# Patient Record
Sex: Male | Born: 1960 | Race: White | Hispanic: No | Marital: Single | Smoking: Current every day smoker
Health system: Southern US, Community
[De-identification: ages and names within clinical notes are randomized; demographics above are authoritative.]

## PROBLEM LIST (undated history)

## (undated) DIAGNOSIS — M4620 Osteomyelitis of vertebra, site unspecified: Secondary | ICD-10-CM

## (undated) DIAGNOSIS — I10 Essential (primary) hypertension: Secondary | ICD-10-CM

## (undated) DIAGNOSIS — F1190 Opioid use, unspecified, uncomplicated: Secondary | ICD-10-CM

## (undated) DIAGNOSIS — N289 Disorder of kidney and ureter, unspecified: Secondary | ICD-10-CM

## (undated) DIAGNOSIS — M545 Low back pain, unspecified: Secondary | ICD-10-CM

## (undated) DIAGNOSIS — F419 Anxiety disorder, unspecified: Secondary | ICD-10-CM

## (undated) DIAGNOSIS — M1990 Unspecified osteoarthritis, unspecified site: Secondary | ICD-10-CM

## (undated) DIAGNOSIS — G629 Polyneuropathy, unspecified: Secondary | ICD-10-CM

## (undated) DIAGNOSIS — G8929 Other chronic pain: Secondary | ICD-10-CM

## (undated) DIAGNOSIS — J189 Pneumonia, unspecified organism: Secondary | ICD-10-CM

## (undated) HISTORY — PX: LEG SURGERY: SHX1003

## (undated) HISTORY — PX: BACK SURGERY: SHX140

## (undated) HISTORY — PX: ABDOMINAL SURGERY: SHX537

## (undated) HISTORY — PX: HERNIA REPAIR: SHX51

---

## 2001-06-23 ENCOUNTER — Emergency Department (HOSPITAL_COMMUNITY): Admission: EM | Admit: 2001-06-23 | Discharge: 2001-06-24 | Payer: Self-pay | Admitting: *Deleted

## 2001-06-23 ENCOUNTER — Encounter: Payer: Self-pay | Admitting: *Deleted

## 2001-09-18 ENCOUNTER — Encounter (HOSPITAL_COMMUNITY): Admission: RE | Admit: 2001-09-18 | Discharge: 2001-10-18 | Payer: Self-pay

## 2001-10-10 ENCOUNTER — Emergency Department (HOSPITAL_COMMUNITY): Admission: EM | Admit: 2001-10-10 | Discharge: 2001-10-10 | Payer: Self-pay | Admitting: *Deleted

## 2001-10-28 ENCOUNTER — Encounter (HOSPITAL_COMMUNITY): Admission: RE | Admit: 2001-10-28 | Discharge: 2001-11-27 | Payer: Self-pay

## 2001-12-05 ENCOUNTER — Encounter (HOSPITAL_COMMUNITY): Admission: RE | Admit: 2001-12-05 | Discharge: 2002-01-04 | Payer: Self-pay

## 2002-01-14 ENCOUNTER — Encounter (HOSPITAL_COMMUNITY): Admission: RE | Admit: 2002-01-14 | Discharge: 2002-02-13 | Payer: Self-pay

## 2002-09-12 ENCOUNTER — Inpatient Hospital Stay (HOSPITAL_COMMUNITY): Admission: EM | Admit: 2002-09-12 | Discharge: 2002-09-16 | Payer: Self-pay | Admitting: Psychiatry

## 2004-06-07 ENCOUNTER — Emergency Department (HOSPITAL_COMMUNITY): Admission: EM | Admit: 2004-06-07 | Discharge: 2004-06-07 | Payer: Self-pay | Admitting: Emergency Medicine

## 2011-11-14 HISTORY — PX: POSTERIOR LUMBAR FUSION: SHX6036

## 2012-05-17 ENCOUNTER — Other Ambulatory Visit: Payer: Self-pay | Admitting: Family Medicine

## 2012-05-17 ENCOUNTER — Ambulatory Visit (HOSPITAL_COMMUNITY)
Admission: RE | Admit: 2012-05-17 | Discharge: 2012-05-17 | Disposition: A | Discharge status: Home / Self Care | Payer: Medicaid Other | Source: Ambulatory Visit | Attending: Internal Medicine | Admitting: Internal Medicine

## 2012-05-17 ENCOUNTER — Other Ambulatory Visit: Payer: Self-pay | Admitting: Internal Medicine

## 2012-05-17 DIAGNOSIS — R0602 Shortness of breath: Secondary | ICD-10-CM

## 2012-05-17 DIAGNOSIS — F17200 Nicotine dependence, unspecified, uncomplicated: Secondary | ICD-10-CM | POA: Insufficient documentation

## 2013-02-01 ENCOUNTER — Emergency Department (HOSPITAL_COMMUNITY)
Admission: EM | Admit: 2013-02-01 | Discharge: 2013-02-02 | Disposition: A | Discharge status: Home / Self Care | Payer: Medicaid Other | Attending: Emergency Medicine | Admitting: Emergency Medicine

## 2013-02-01 ENCOUNTER — Encounter (HOSPITAL_COMMUNITY): Payer: Self-pay

## 2013-02-01 DIAGNOSIS — F1010 Alcohol abuse, uncomplicated: Secondary | ICD-10-CM | POA: Insufficient documentation

## 2013-02-01 DIAGNOSIS — F17200 Nicotine dependence, unspecified, uncomplicated: Secondary | ICD-10-CM | POA: Insufficient documentation

## 2013-02-01 DIAGNOSIS — Z043 Encounter for examination and observation following other accident: Secondary | ICD-10-CM | POA: Insufficient documentation

## 2013-02-01 DIAGNOSIS — F10929 Alcohol use, unspecified with intoxication, unspecified: Secondary | ICD-10-CM

## 2013-02-01 NOTE — ED Notes (Addendum)
EMS- pt from home.  Larey Seat tonight walking with walker to br.  Pt states he had been drinking and shouldn't have even been walking.  Pt denies any c/o post fall. Wife had wanted him to be evaluated for his fall.  [id number]
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
## 2013-02-02 NOTE — ED Notes (Signed)
Pt remains resting.   Pt given urinal.  md in to speak to pt.  Pt still not able to give md hx of problem.

## 2013-02-02 NOTE — ED Notes (Signed)
Pt remains resting at this time.  md in to speak to pt.  Pt still unable to talk to him.

## 2014-08-03 ENCOUNTER — Emergency Department (HOSPITAL_COMMUNITY): Payer: Medicaid Other

## 2014-08-03 ENCOUNTER — Emergency Department (HOSPITAL_COMMUNITY)
Admission: EM | Admit: 2014-08-03 | Discharge: 2014-08-03 | Disposition: A | Discharge status: Home / Self Care | Payer: Medicaid Other | Attending: Emergency Medicine | Admitting: Emergency Medicine

## 2014-08-03 ENCOUNTER — Encounter (HOSPITAL_COMMUNITY): Payer: Self-pay | Admitting: Emergency Medicine

## 2014-08-03 DIAGNOSIS — F17200 Nicotine dependence, unspecified, uncomplicated: Secondary | ICD-10-CM | POA: Diagnosis not present

## 2014-08-03 DIAGNOSIS — R059 Cough, unspecified: Secondary | ICD-10-CM | POA: Insufficient documentation

## 2014-08-03 DIAGNOSIS — Z79899 Other long term (current) drug therapy: Secondary | ICD-10-CM | POA: Diagnosis not present

## 2014-08-03 DIAGNOSIS — J069 Acute upper respiratory infection, unspecified: Secondary | ICD-10-CM | POA: Insufficient documentation

## 2014-08-03 DIAGNOSIS — R05 Cough: Secondary | ICD-10-CM | POA: Diagnosis present

## 2014-08-03 MED ORDER — BENZONATATE 100 MG PO CAPS: 100.0000 mg | ORAL_CAPSULE | Freq: Three times a day (TID) | ORAL | Status: DC
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
## 2014-08-03 NOTE — ED Notes (Signed)
Per EMS pt reports cold and non productive cough x 5 days. Not taking anything over the counter. BP 164/100, HR 90, O2 99%.

## 2014-08-03 NOTE — Discharge Instructions (Signed)
Cough, Adult   A cough is a reflex. It helps you clear your throat and airways. A cough can help heal your body. A cough can last 2 or 3 weeks (acute) or may last more than 8 weeks (chronic). Some common causes of a cough can include an infection, allergy, or a cold.  HOME CARE  · Only take medicine as told by your doctor.  · If given, take your medicines (antibiotics) as told. Finish them even if you start to feel better.  · Use a cold steam vaporizer or humidifier in your home. This can help loosen thick spit (secretions).  · Sleep so you are almost sitting up (semi-upright). Use pillows to do this. This helps reduce coughing.  · Rest as needed.  · Stop smoking if you smoke.  GET HELP RIGHT AWAY IF:  · You have yellowish-white fluid (pus) in your thick spit.  · Your cough gets worse.  · Your medicine does not reduce coughing, and you are losing sleep.  · You cough up blood.  · You have trouble breathing.  · Your pain gets worse and medicine does not help.  · You have a fever.  MAKE SURE YOU:   · Understand these instructions.  · Will watch your condition.  · Will get help right away if you are not doing well or get worse.  Document Released: 07/13/2011 Document Revised: 03/16/2014 Document Reviewed: 07/13/2011  Exit[employer]® Patient Information ©2015 [employer]. This information is not intended to replace advice given to you by your health care provider. Make sure you discuss any questions you have with your health care provider.

## 2014-08-24 ENCOUNTER — Inpatient Hospital Stay (HOSPITAL_COMMUNITY): Payer: Medicaid Other

## 2014-08-24 ENCOUNTER — Emergency Department (HOSPITAL_COMMUNITY): Payer: Medicaid Other

## 2014-08-24 ENCOUNTER — Encounter (HOSPITAL_COMMUNITY): Payer: Self-pay | Admitting: Emergency Medicine

## 2014-08-24 ENCOUNTER — Inpatient Hospital Stay (HOSPITAL_COMMUNITY)
Admission: EM | Admit: 2014-08-24 | Discharge: 2014-08-29 | DRG: 871 | Disposition: A | Discharge status: Home / Self Care | Payer: Medicaid Other | Attending: Internal Medicine | Admitting: Internal Medicine

## 2014-08-24 DIAGNOSIS — Z79899 Other long term (current) drug therapy: Secondary | ICD-10-CM

## 2014-08-24 DIAGNOSIS — G9389 Other specified disorders of brain: Secondary | ICD-10-CM

## 2014-08-24 DIAGNOSIS — I509 Heart failure, unspecified: Secondary | ICD-10-CM | POA: Diagnosis present

## 2014-08-24 DIAGNOSIS — F17210 Nicotine dependence, cigarettes, uncomplicated: Secondary | ICD-10-CM | POA: Diagnosis present

## 2014-08-24 DIAGNOSIS — Z590 Homelessness: Secondary | ICD-10-CM

## 2014-08-24 DIAGNOSIS — J440 Chronic obstructive pulmonary disease with acute lower respiratory infection: Secondary | ICD-10-CM | POA: Diagnosis present

## 2014-08-24 DIAGNOSIS — E871 Hypo-osmolality and hyponatremia: Secondary | ICD-10-CM | POA: Diagnosis present

## 2014-08-24 DIAGNOSIS — R600 Localized edema: Secondary | ICD-10-CM

## 2014-08-24 DIAGNOSIS — R229 Localized swelling, mass and lump, unspecified: Secondary | ICD-10-CM

## 2014-08-24 DIAGNOSIS — R6520 Severe sepsis without septic shock: Secondary | ICD-10-CM | POA: Diagnosis present

## 2014-08-24 DIAGNOSIS — S01111A Laceration without foreign body of right eyelid and periocular area, initial encounter: Secondary | ICD-10-CM | POA: Diagnosis present

## 2014-08-24 DIAGNOSIS — G9340 Encephalopathy, unspecified: Secondary | ICD-10-CM | POA: Diagnosis present

## 2014-08-24 DIAGNOSIS — D649 Anemia, unspecified: Secondary | ICD-10-CM | POA: Diagnosis present

## 2014-08-24 DIAGNOSIS — W19XXXA Unspecified fall, initial encounter: Secondary | ICD-10-CM | POA: Diagnosis present

## 2014-08-24 DIAGNOSIS — J189 Pneumonia, unspecified organism: Secondary | ICD-10-CM

## 2014-08-24 DIAGNOSIS — IMO0002 Reserved for concepts with insufficient information to code with codable children: Secondary | ICD-10-CM

## 2014-08-24 DIAGNOSIS — A419 Sepsis, unspecified organism: Principal | ICD-10-CM

## 2014-08-24 DIAGNOSIS — J9601 Acute respiratory failure with hypoxia: Secondary | ICD-10-CM | POA: Diagnosis present

## 2014-08-24 DIAGNOSIS — R05 Cough: Secondary | ICD-10-CM | POA: Diagnosis present

## 2014-08-24 DIAGNOSIS — I059 Rheumatic mitral valve disease, unspecified: Secondary | ICD-10-CM | POA: Diagnosis not present

## 2014-08-24 DIAGNOSIS — M549 Dorsalgia, unspecified: Secondary | ICD-10-CM | POA: Diagnosis present

## 2014-08-24 HISTORY — DX: Essential (primary) hypertension: I10

## 2014-08-24 HISTORY — DX: Low back pain, unspecified: M54.50

## 2014-08-24 HISTORY — DX: Pneumonia, unspecified organism: J18.9

## 2014-08-24 HISTORY — DX: Unspecified osteoarthritis, unspecified site: M19.90

## 2014-08-24 HISTORY — DX: Other chronic pain: G89.29

## 2014-08-24 HISTORY — DX: Anxiety disorder, unspecified: F41.9

## 2014-08-24 HISTORY — DX: Low back pain: M54.5

## 2014-08-24 LAB — CBC WITH DIFFERENTIAL/PLATELET
Basophils Absolute: 0 10*3/uL (ref 0.0–0.1)
Basophils Relative: 0 % (ref 0–1)
Eosinophils Absolute: 0 10*3/uL (ref 0.0–0.7)
Eosinophils Relative: 0 % (ref 0–5)
HCT: 30.7 % — ABNORMAL LOW (ref 39.0–52.0)
HEMOGLOBIN: 10.3 g/dL — AB (ref 13.0–17.0)
LYMPHS ABS: 0.5 10*3/uL — AB (ref 0.7–4.0)
Lymphocytes Relative: 3 % — ABNORMAL LOW (ref 12–46)
MCH: 27.2 pg (ref 26.0–34.0)
MCHC: 33.6 g/dL (ref 30.0–36.0)
MCV: 81.2 fL (ref 78.0–100.0)
MONOS PCT: 7 % (ref 3–12)
Monocytes Absolute: 1.1 10*3/uL — ABNORMAL HIGH (ref 0.1–1.0)
NEUTROS ABS: 14.2 10*3/uL — AB (ref 1.7–7.7)
NEUTROS PCT: 90 % — AB (ref 43–77)
Platelets: 144 10*3/uL — ABNORMAL LOW (ref 150–400)
RBC: 3.78 MIL/uL — AB (ref 4.22–5.81)
RDW: 14.5 % (ref 11.5–15.5)
WBC: 15.8 10*3/uL — ABNORMAL HIGH (ref 4.0–10.5)

## 2014-08-24 LAB — URINALYSIS, ROUTINE W REFLEX MICROSCOPIC
BILIRUBIN URINE: NEGATIVE
GLUCOSE, UA: NEGATIVE mg/dL
Hgb urine dipstick: NEGATIVE
Ketones, ur: NEGATIVE mg/dL
Leukocytes, UA: NEGATIVE
Nitrite: NEGATIVE
Protein, ur: NEGATIVE mg/dL
SPECIFIC GRAVITY, URINE: 1.017 (ref 1.005–1.030)
UROBILINOGEN UA: 1 mg/dL (ref 0.0–1.0)
pH: 5.5 (ref 5.0–8.0)

## 2014-08-24 LAB — COMPREHENSIVE METABOLIC PANEL
ALT: 40 U/L (ref 0–53)
ANION GAP: 12 (ref 5–15)
AST: 39 U/L — ABNORMAL HIGH (ref 0–37)
Albumin: 2.3 g/dL — ABNORMAL LOW (ref 3.5–5.2)
Alkaline Phosphatase: 200 U/L — ABNORMAL HIGH (ref 39–117)
BILIRUBIN TOTAL: 2 mg/dL — AB (ref 0.3–1.2)
BUN: 11 mg/dL (ref 6–23)
CO2: 26 mEq/L (ref 19–32)
Calcium: 8.2 mg/dL — ABNORMAL LOW (ref 8.4–10.5)
Chloride: 93 mEq/L — ABNORMAL LOW (ref 96–112)
Creatinine, Ser: 0.66 mg/dL (ref 0.50–1.35)
GFR calc Af Amer: >90 mL/min (ref >90)
GFR calc non Af Amer: >90 mL/min (ref >90)
GLUCOSE: 143 mg/dL — AB (ref 70–99)
POTASSIUM: 3.6 meq/L — AB (ref 3.7–5.3)
SODIUM: 131 meq/L — AB (ref 137–147)
Total Protein: 6.7 g/dL (ref 6.0–8.3)

## 2014-08-24 LAB — I-STAT VENOUS BLOOD GAS, ED
Acid-Base Excess: 1 mmol/L (ref 0.0–2.0)
Bicarbonate: 25.1 mEq/L — ABNORMAL HIGH (ref 20.0–24.0)
O2 SAT: 88 %
PCO2 VEN: 38.7 mmHg — AB (ref 45.0–50.0)
PH VEN: 7.42 — AB (ref 7.250–7.300)
PO2 VEN: 54 mmHg — AB (ref 30.0–45.0)
TCO2: 26 mmol/L (ref 0–100)

## 2014-08-24 LAB — I-STAT CG4 LACTIC ACID, ED
LACTIC ACID, VENOUS: 2.23 mmol/L — AB (ref 0.5–2.2)
Lactic Acid, Venous: 2.18 mmol/L (ref 0.5–2.2)

## 2014-08-24 LAB — TROPONIN I
Troponin I: <0.3 ng/mL (ref <0.30)
Troponin I: <0.3 ng/mL (ref <0.30)

## 2014-08-24 LAB — RAPID URINE DRUG SCREEN, HOSP PERFORMED
AMPHETAMINES: NOT DETECTED
BENZODIAZEPINES: POSITIVE — AB
Barbiturates: NOT DETECTED
Cocaine: NOT DETECTED
Opiates: POSITIVE — AB
TETRAHYDROCANNABINOL: POSITIVE — AB

## 2014-08-24 LAB — PRO B NATRIURETIC PEPTIDE: Pro B Natriuretic peptide (BNP): 1367 pg/mL — ABNORMAL HIGH (ref 0–125)

## 2014-08-24 LAB — RETICULOCYTES
RBC.: 3.96 MIL/uL — ABNORMAL LOW (ref 4.22–5.81)
Retic Count, Absolute: 63.4 10*3/uL (ref 19.0–186.0)
Retic Ct Pct: 1.6 % (ref 0.4–3.1)

## 2014-08-24 LAB — LIPASE, BLOOD: Lipase: 12 U/L (ref 11–59)

## 2014-08-24 LAB — ETHANOL: Alcohol, Ethyl (B): <11 mg/dL (ref 0–11)

## 2014-08-24 LAB — I-STAT TROPONIN, ED: Troponin i, poc: 0.15 ng/mL (ref 0.00–0.08)

## 2014-08-24 LAB — PROTIME-INR
INR: 1.27 (ref 0.00–1.49)
PROTHROMBIN TIME: 15.9 s — AB (ref 11.6–15.2)

## 2014-08-24 LAB — STREP PNEUMONIAE URINARY ANTIGEN: STREP PNEUMO URINARY ANTIGEN: NEGATIVE

## 2014-08-24 MED ORDER — SODIUM CHLORIDE 0.9 % IV BOLUS (SEPSIS)
2000.0000 mL | Freq: Once | INTRAVENOUS | Status: AC
  Administered 2014-08-24: 2000 mL via INTRAVENOUS

## 2014-08-24 MED ORDER — INFLUENZA VAC SPLIT QUAD 0.5 ML IM SUSY
0.5000 mL | PREFILLED_SYRINGE | INTRAMUSCULAR | Status: AC
  Administered 2014-08-26: 0.5 mL via INTRAMUSCULAR
  Filled 2014-08-24 (×2): qty 0.5

## 2014-08-24 MED ORDER — POTASSIUM CHLORIDE CRYS ER 20 MEQ PO TBCR
40.0000 meq | EXTENDED_RELEASE_TABLET | Freq: Once | ORAL | Status: AC
  Administered 2014-08-24: 40 meq via ORAL
  Filled 2014-08-24: qty 2

## 2014-08-24 MED ORDER — ALBUTEROL (5 MG/ML) CONTINUOUS INHALATION SOLN
10.0000 mg/h | INHALATION_SOLUTION | RESPIRATORY_TRACT | Status: DC
  Administered 2014-08-24: 10 mg/h via RESPIRATORY_TRACT
  Filled 2014-08-24: qty 20

## 2014-08-24 MED ORDER — DEXTROSE 5 % IV SOLN
500.0000 mg | INTRAVENOUS | Status: DC
  Administered 2014-08-24 – 2014-08-26 (×3): 500 mg via INTRAVENOUS
  Filled 2014-08-24 (×5): qty 500

## 2014-08-24 MED ORDER — METHYLPREDNISOLONE SODIUM SUCC 125 MG IJ SOLR
125.0000 mg | Freq: Once | INTRAMUSCULAR | Status: AC
  Administered 2014-08-24: 125 mg via INTRAVENOUS
  Filled 2014-08-24: qty 2

## 2014-08-24 MED ORDER — VANCOMYCIN HCL IN DEXTROSE 1-5 GM/200ML-% IV SOLN
1000.0000 mg | Freq: Three times a day (TID) | INTRAVENOUS | Status: DC
  Administered 2014-08-24 – 2014-08-27 (×10): 1000 mg via INTRAVENOUS
  Filled 2014-08-24 (×13): qty 200

## 2014-08-24 MED ORDER — ALBUTEROL SULFATE (2.5 MG/3ML) 0.083% IN NEBU: 2.5000 mg | INHALATION_SOLUTION | RESPIRATORY_TRACT | Status: DC | PRN

## 2014-08-24 MED ORDER — ACETAMINOPHEN 500 MG PO TABS
500.0000 mg | ORAL_TABLET | Freq: Four times a day (QID) | ORAL | Status: DC | PRN
Start: 2014-08-24 — End: 2014-08-29

## 2014-08-24 MED ORDER — PIPERACILLIN-TAZOBACTAM 3.375 G IVPB 30 MIN
3.3750 g | Freq: Once | INTRAVENOUS | Status: AC
  Administered 2014-08-24: 3.375 g via INTRAVENOUS
  Filled 2014-08-24: qty 50

## 2014-08-24 MED ORDER — PIPERACILLIN-TAZOBACTAM 3.375 G IVPB
3.3750 g | Freq: Three times a day (TID) | INTRAVENOUS | Status: DC
  Administered 2014-08-24 – 2014-08-29 (×15): 3.375 g via INTRAVENOUS
  Filled 2014-08-24 (×18): qty 50

## 2014-08-24 MED ORDER — LEVOFLOXACIN IN D5W 750 MG/150ML IV SOLN
750.0000 mg | Freq: Once | INTRAVENOUS | Status: AC
  Administered 2014-08-24: 750 mg via INTRAVENOUS
  Filled 2014-08-24: qty 150

## 2014-08-24 MED ORDER — OXYCODONE HCL 5 MG PO TABS
30.0000 mg | ORAL_TABLET | ORAL | Status: DC | PRN
  Administered 2014-08-25 – 2014-08-29 (×18): 30 mg via ORAL
  Filled 2014-08-24 (×19): qty 6

## 2014-08-24 MED ORDER — ACETAMINOPHEN 500 MG PO TABS
1000.0000 mg | ORAL_TABLET | Freq: Once | ORAL | Status: AC
  Administered 2014-08-24: 1000 mg via ORAL
  Filled 2014-08-24: qty 2

## 2014-08-24 MED ORDER — POTASSIUM CHLORIDE 10 MEQ/100ML IV SOLN
10.0000 meq | INTRAVENOUS | Status: AC
  Filled 2014-08-24 (×3): qty 100

## 2014-08-24 MED ORDER — HEPARIN SODIUM (PORCINE) 5000 UNIT/ML IJ SOLN
5000.0000 [IU] | Freq: Three times a day (TID) | INTRAMUSCULAR | Status: DC
  Administered 2014-08-24 – 2014-08-26 (×5): 5000 [IU] via SUBCUTANEOUS
  Filled 2014-08-24 (×17): qty 1

## 2014-08-24 MED ORDER — VANCOMYCIN HCL 10 G IV SOLR
1500.0000 mg | Freq: Once | INTRAVENOUS | Status: AC
  Administered 2014-08-24: 1500 mg via INTRAVENOUS
  Filled 2014-08-24: qty 1500

## 2014-08-24 MED ORDER — SODIUM CHLORIDE 0.9 % IV BOLUS (SEPSIS)
1000.0000 mL | Freq: Once | INTRAVENOUS | Status: AC
  Administered 2014-08-24: 1000 mL via INTRAVENOUS

## 2014-08-24 MED ORDER — PNEUMOCOCCAL VAC POLYVALENT 25 MCG/0.5ML IJ INJ
0.5000 mL | INJECTION | INTRAMUSCULAR | Status: AC
  Administered 2014-08-26: 0.5 mL via INTRAMUSCULAR
  Filled 2014-08-24 (×2): qty 0.5

## 2014-08-24 NOTE — Progress Notes (Signed)
   [facility]                                                                              Patient Demographics  Ethan [name], is a 53 y.o. male, DOB - [birth year], OZH:[id number]  Admit date - 08/24/2014   Admitting Physician Lorretta HarpXilin Niu, MD  Outpatient Primary MD for the patient is [name],[name], MD  LOS - 0   Chief Complaint  Patient presents with  . Fall      HPI on 08/24/2014 Ethan [name] is a 53 y.o. male with PMH of back pain, who presented with fever cough, chest pain and AMS. Upon admission, patient had altered mental status an did not answer questions consistently. The history was obtained both from patient himself and also from ED physician. Patient is homeless and is currently living in shelter home. About 5 days ago, he started coughing with yellow colored sputum. He denied chest pain and SOB, but his oxygen saturation decreased to 70% which responded to nonrebreather, and improved to above 90% in ED. He was febrile with temperature 102.2 in ED. He denied nausea, vomiting, abdominal pain or diarrhea. He had bilateral leg edema. CXR showed multifocal lobar pneumonia on the right. WBC 15.8. He was found to have elevated proBNP 1367, negative troponin. He was admitted to inpatient for further evaluation and treatment.    Assessment & Plan   Patient was admitted early this morning by Dr. Lorretta HarpXilin Niu.  Please see H&P.  Severe sepsis secondary to community-acquired pneumonia -Chest x-ray: Multi lobar pneumonia on the right. -Patient did have some altered mental status however seems to be back at baseline. -Patient was febrile, with leukocytosis as well as tachycardia and tachypnea. -Will continue antibiotics, pending blood culture, respiratory viral panel -Continue nebulizer treatments  Acute hypoxic respiratory failure -Likely secondary to number -Oxygen saturation was 71% in the ER -Continue supplemental oxygen to maintain saturations above 92%  Acute  encephalopathy -Secondary to community acquired pneumonia and hypoxia -Improved, patient appears to be at his baseline -CT head  Bilateral lower extremity edema -BNP 1367 -Echocardiogram pending  Normocytic anemia -Anemia panel pending  Homeless -Social work consult  Code Status: Full  Family Communication: none at bedside  Disposition Plan: Admitted, patient reassessed in the ED, will admit him to medical floor instead of stepdown.  Time Spent in minutes   30 minutes  Procedures  None  Consults   none  DVT Prophylaxis  Heparin  Takeira Yanes D.O. on 08/24/2014 at 12:07 PM  Between 7am to 7pm - Pager - [phone]  After 7pm go to [email or web address] - password [email or web address]  And look for the night coverage person covering for me after hours  [facility] Office  [phone]

## 2014-08-24 NOTE — ED Notes (Signed)
Patient transported to CT 

## 2014-08-24 NOTE — ED Notes (Signed)
First of Normal saline complete at this time.

## 2014-08-24 NOTE — ED Notes (Signed)
Meal Tray ordered for patient.
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
## 2014-08-24 NOTE — ED Notes (Signed)
Spoke with Dr. Mora Bellmanni about starting the antibiotics before collecting the urine sample and was instructed to start the antibiotics.

## 2014-08-24 NOTE — ED Notes (Signed)
MD at bedside. 

## 2014-08-24 NOTE — Progress Notes (Signed)
ANTIBIOTIC CONSULT NOTE - INITIAL  Pharmacy Consult for vancomycin/zosyn Indication: CAP, severe sepsis  Allergies  Allergen Reactions  . Motrin [Ibuprofen] Nausea Only    Patient Measurements: Height: 5\' 9"  (175.3 cm) Weight: 155 lb 12.8 oz (70.67 kg) IBW/kg (Calculated) : 70.7  Vital Signs: Temp: 97.9 F (36.6 C) (10/12 1406) Temp Source: Oral (10/12 1406) BP: 110/61 mmHg (10/12 1406) Pulse Rate: 87 (10/12 1406) Intake/Output from previous day: 10/11 0701 - 10/12 0700 In: 2000 [I.V.:2000] Out: 50 [Urine:50] Intake/Output from this shift:    Labs:  Recent Labs  08/24/14 0412  WBC 15.8*  HGB 10.3*  PLT 144*  CREATININE 0.66   Estimated Creatinine Clearance: 106.8 ml/min (by C-G formula based on Cr of 0.66). No results found for this basename: VANCOTROUGH, VANCOPEAK, VANCORANDOM, GENTTROUGH, GENTPEAK, GENTRANDOM, TOBRATROUGH, TOBRAPEAK, TOBRARND, AMIKACINPEAK, AMIKACINTROU, AMIKACIN,  in the last 72 hours   Microbiology: No results found for this or any previous visit (from the past 720 hour(s)).  Medical History: History reviewed. No pertinent past medical history.  Assessment: 53 YO homeless male presented with cough, fever, leukocytosis and AMS. Pharmacy is consulted to dose vancomycin and zosyn for CAP and sepsis. Pt. Is also started on IV azithromycin. One dose of vancomycin 1500 mg was given in the ED at 0515, one dose of zosyn 3.375g was given at 0440. Scr 0.66, est. crcl > 100 ml/min. Tm 102.2, wbc 15.8.  Vancomycin 10/12 >> Zosyn 10/12 >> azithro 10/12 >>  10/12 blood x 2 10/12 urine   Goal of Therapy:  Vancomycin trough level 15-20 mcg/ml  Plan:  Vancomycin 1g IV Q 8 hrs Zosyn 3.375 g IV Q 8 hrs (4 hr infusion) F/u renal function and cultures F/u plan for narrowing abx Vancomycin trough at steady state if continues  Bayard HuggerMei Roneshia Drew, PharmD, BCPS  Clinical Pharmacist  Pager: [phone]   08/24/2014,2:20 PM

## 2014-08-24 NOTE — Progress Notes (Signed)
Utilization review completed.  

## 2014-08-24 NOTE — Progress Notes (Signed)
Report received from ED 
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
## 2014-08-24 NOTE — Progress Notes (Signed)
Ethan [name] [id number] Code Status: Full   Admission Data: 08/24/2014 2:12 PM Attending Provider:  Niu NWG:NFAOZH,YQMVHPCP:BLOUNT,[name], MD Consults/ Treatment Team:    Ethan [name] is a 53 y.o. male patient admitted from ED awake, alert - oriented  X 3 - no acute distress noted.  VSS - Blood pressure 110/61, pulse 87, temperature 97.9 F (36.6 C), temperature source Oral, resp. rate 16, height 5\' 9"  (1.753 m), weight 70.67 kg (155 lb 12.8 oz), SpO2 93.00%.    IV in place, occlusive dsg intact without redness.  Orientation to room, and floor completed with information packet given to patient/family. Admission INP armband ID verified with patient/family, and in place.   SR up x 2, fall assessment complete, with patient and family able to verbalize understanding of risk associated with falls, and verbalized understanding to call nsg before up out of bed.  Call light within reach, patient able to voice, and demonstrate understanding.  Skin, clean-dry- intact without evidence of bruising, or skin tears.   No evidence of skin break down noted on exam.     Will cont to eval and treat per MD orders.  Al DecantFlores, Nichalos Brenton F, [place]RN 08/24/2014 2:12 PM

## 2014-08-24 NOTE — ED Notes (Addendum)
Per [facility], [facility] was [facility] when a Engineer, materialssecurity officer found the pt on the floor in a pool of his blood from wound above right eye. Pt has a laceration above the eye. Pt warm to touch. Pt having confused speech.

## 2014-08-24 NOTE — ED Notes (Signed)
Pt resting no needs at this time.

## 2014-08-25 DIAGNOSIS — R229 Localized swelling, mass and lump, unspecified: Secondary | ICD-10-CM

## 2014-08-25 LAB — COMPREHENSIVE METABOLIC PANEL
ALK PHOS: 162 U/L — AB (ref 39–117)
ALT: 40 U/L (ref 0–53)
AST: 41 U/L — ABNORMAL HIGH (ref 0–37)
Albumin: 2.1 g/dL — ABNORMAL LOW (ref 3.5–5.2)
Anion gap: 9 (ref 5–15)
BILIRUBIN TOTAL: 0.7 mg/dL (ref 0.3–1.2)
BUN: 10 mg/dL (ref 6–23)
CALCIUM: 8.5 mg/dL (ref 8.4–10.5)
CO2: 28 mEq/L (ref 19–32)
Chloride: 97 mEq/L (ref 96–112)
Creatinine, Ser: 0.6 mg/dL (ref 0.50–1.35)
GFR calc Af Amer: >90 mL/min (ref >90)
GLUCOSE: 171 mg/dL — AB (ref 70–99)
Potassium: 4.2 mEq/L (ref 3.7–5.3)
SODIUM: 134 meq/L — AB (ref 137–147)
Total Protein: 6.5 g/dL (ref 6.0–8.3)

## 2014-08-25 LAB — CBC WITH DIFFERENTIAL/PLATELET
BASOS ABS: 0 10*3/uL (ref 0.0–0.1)
Basophils Relative: 0 % (ref 0–1)
EOS PCT: 0 % (ref 0–5)
Eosinophils Absolute: 0 10*3/uL (ref 0.0–0.7)
HCT: 27.4 % — ABNORMAL LOW (ref 39.0–52.0)
Hemoglobin: 9.2 g/dL — ABNORMAL LOW (ref 13.0–17.0)
LYMPHS ABS: 0.5 10*3/uL — AB (ref 0.7–4.0)
Lymphocytes Relative: 4 % — ABNORMAL LOW (ref 12–46)
MCH: 27.5 pg (ref 26.0–34.0)
MCHC: 33.6 g/dL (ref 30.0–36.0)
MCV: 81.8 fL (ref 78.0–100.0)
Monocytes Absolute: 0.6 10*3/uL (ref 0.1–1.0)
Monocytes Relative: 5 % (ref 3–12)
NEUTROS PCT: 91 % — AB (ref 43–77)
Neutro Abs: 10.1 10*3/uL — ABNORMAL HIGH (ref 1.7–7.7)
PLATELETS: 133 10*3/uL — AB (ref 150–400)
RBC: 3.35 MIL/uL — AB (ref 4.22–5.81)
RDW: 14.6 % (ref 11.5–15.5)
WBC: 11.3 10*3/uL — AB (ref 4.0–10.5)

## 2014-08-25 LAB — IRON AND TIBC
IRON: 11 ug/dL — AB (ref 42–135)
Saturation Ratios: 6 % — ABNORMAL LOW (ref 20–55)
TIBC: 182 ug/dL — AB (ref 215–435)
UIBC: 171 ug/dL (ref 125–400)

## 2014-08-25 LAB — URINE CULTURE
COLONY COUNT: NO GROWTH
CULTURE: NO GROWTH

## 2014-08-25 LAB — EXPECTORATED SPUTUM ASSESSMENT W GRAM STAIN, RFLX TO RESP C

## 2014-08-25 LAB — TROPONIN I

## 2014-08-25 LAB — LEGIONELLA ANTIGEN, URINE

## 2014-08-25 LAB — HIV ANTIBODY (ROUTINE TESTING W REFLEX): HIV 1&2 Ab, 4th Generation: NONREACTIVE

## 2014-08-25 LAB — FERRITIN: FERRITIN: 1107 ng/mL — AB (ref 22–322)

## 2014-08-25 LAB — EXPECTORATED SPUTUM ASSESSMENT W REFEX TO RESP CULTURE

## 2014-08-25 LAB — VITAMIN B12: Vitamin B-12: 689 pg/mL (ref 211–911)

## 2014-08-25 LAB — FOLATE: Folate: 12.6 ng/mL

## 2014-08-25 MED ORDER — ENSURE COMPLETE PO LIQD
237.0000 mL | Freq: Two times a day (BID) | ORAL | Status: DC
  Administered 2014-08-25 – 2014-08-29 (×9): 237 mL via ORAL

## 2014-08-25 MED ORDER — POLYETHYLENE GLYCOL 3350 17 G PO PACK
17.0000 g | PACK | Freq: Every day | ORAL | Status: DC
  Administered 2014-08-26 – 2014-08-29 (×4): 17 g via ORAL
  Filled 2014-08-25 (×4): qty 1

## 2014-08-25 MED ORDER — NICOTINE 21 MG/24HR TD PT24
21.0000 mg | MEDICATED_PATCH | Freq: Every day | TRANSDERMAL | Status: DC
  Administered 2014-08-25 – 2014-08-29 (×5): 21 mg via TRANSDERMAL
  Filled 2014-08-25 (×5): qty 1

## 2014-08-25 MED ORDER — TAB-A-VITE/IRON PO TABS
1.0000 | ORAL_TABLET | Freq: Every day | ORAL | Status: DC
  Administered 2014-08-25 – 2014-08-29 (×5): 1 via ORAL
  Filled 2014-08-25 (×5): qty 1

## 2014-08-25 NOTE — Progress Notes (Signed)
[facility]                                                                              Patient Demographics  Ethan [name], is a 53 y.o. male, DOB - 1961/04/23, WUJ:[id number]  Admit date - 08/24/2014   Admitting Physician Lorretta HarpXilin Niu, MD  Outpatient Primary MD for the patient is [name],Ethan VINCENT, MD  LOS - 1   Chief Complaint  Patient presents with  . Fall      HPI on 08/24/2014  Ethan [name] is a 53 y.o. male with PMH of back pain, who presented with fever cough, chest pain and AMS. Upon admission, patient had altered mental status an did not answer questions consistently. The history was obtained both from patient himself and also from ED physician. Patient is homeless and is currently living in shelter home. About 5 days ago, he started coughing with yellow colored sputum. He denied chest pain and SOB, but his oxygen saturation decreased to 70% which responded to nonrebreather, and improved to above 90% in ED. He was febrile with temperature 102.2 in ED. He denied nausea, vomiting, abdominal pain or diarrhea. He had bilateral leg edema. CXR showed multifocal lobar pneumonia on the right. WBC 15.8. He was found to have elevated proBNP 1367, negative troponin. He was admitted to inpatient for further evaluation and treatment.   Interim History Admitted for sepsis secondary to CAP, improving.  Homeless, PT and OT consulted.  SW also consulted for placement.  Mass noted on CT, pending MRI.   Assessment & Plan  Severe sepsis secondary to community-acquired pneumonia  -Upon admission, patient was febrile, with leukocytosis as well as tachycardia and tachypnea.  -Sepsis appears to be improving, leukocytosis trending downward, patient has been afebrile -Chest x-ray: Multi lobar pneumonia on the right.  -Patient did have some altered mental status however seems to be back at baseline.  -Pending blood culture, respiratory viral panel  -Continue nebulizer treatments PRN and  vanc and zosyn, will discontinue azithromycin  Acute hypoxic respiratory failure  -Likely secondary to number  -Oxygen saturation was 71% in the ER  -Continue supplemental oxygen to maintain saturations above 92%   Acute encephalopathy  -Secondary to community acquired pneumonia and hypoxia  -Improved, patient appears to be at his baseline  -CT head: No acute intracranial injury.  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 Recommend nonemergent brain MRI with contrast.  Bilateral lower extremity edema  -BNP 1367  -Echocardiogram pending   Normocytic anemia  -Anemia panel: Iron 11, ferritin 1107 (adequate stores) -Drop in Hb likely dilutional, will continue to monitor CBC -Continue supplementation   Homeless  -Social work consult   Tobacco Abuse -Smoking cessation counseling given -Nicotine patch  Deconditioning -PT and OT consulted -OT recommended SNF -Pending PT evaluation  Code Status: Full   Family Communication: none at bedside   Disposition Plan: Admitted  Time Spent in minutes 30 minutes   Procedures  None   Consults  none   DVT Prophylaxis Heparin  Lab Results  Component Value Date   PLT 133* 08/25/2014    Medications  Scheduled Meds: . azithromycin  500 mg Intravenous Q24H  . heparin  5,000 Units Subcutaneous 3 times per day  . Influenza vac split quadrivalent PF  0.5 mL Intramuscular Tomorrow-1000  . nicotine  21 mg Transdermal Daily  . piperacillin-tazobactam (ZOSYN)  IV  3.375 g Intravenous 3 times per day  . pneumococcal 23 valent vaccine  0.5 mL Intramuscular Tomorrow-1000  . vancomycin  1,000 mg Intravenous Q8H   Continuous Infusions:  PRN Meds:.acetaminophen, albuterol, oxycodone  Antibiotics    Anti-infectives   Start     Dose/Rate Route Frequency Ordered Stop   08/24/14 1800  azithromycin (ZITHROMAX) 500 mg in dextrose 5 % 250 mL IVPB     500 mg 250 mL/hr over 60 Minutes  Intravenous Every 24 hours 08/24/14 1410     08/24/14 1500  piperacillin-tazobactam (ZOSYN) IVPB 3.375 g     3.375 g 12.5 mL/hr over 240 Minutes Intravenous 3 times per day 08/24/14 1430     08/24/14 1430  vancomycin (VANCOCIN) IVPB 1000 mg/200 mL premix     1,000 mg 200 mL/hr over 60 Minutes Intravenous Every 8 hours 08/24/14 1429     08/24/14 0430  vancomycin (VANCOCIN) 1,500 mg in sodium chloride 0.9 % 500 mL IVPB     1,500 mg 250 mL/hr over 120 Minutes Intravenous  Once 08/24/14 0427 08/24/14 0952   08/24/14 0430  piperacillin-tazobactam (ZOSYN) IVPB 3.375 g     3.375 g 100 mL/hr over 30 Minutes Intravenous  Once 08/24/14 0427 08/24/14 0511   08/24/14 0430  levofloxacin (LEVAQUIN) IVPB 750 mg     750 mg 100 mL/hr over 90 Minutes Intravenous  Once 08/24/14 0427 08/24/14 0930        Subjective:   Ethan Lloyd seen and examined today.  Patient states he still feels able. He does not really remember yesterday. Patient currently has no complaints of chest pain, abdominal pain. He continues to cough.    Objective:   Filed Vitals:   08/24/14 1341 08/24/14 1406 08/24/14 2216 08/25/14 0605  BP: 111/78 110/61 101/63 106/69  Pulse: 81 87 86 77  Temp:  97.9 F (36.6 C) 98.5 F (36.9 C) 97.8 F (36.6 C)  TempSrc:  Oral Oral Oral  Resp: 17 16 16 16   Height:  5\' 9"  (1.753 m)    Weight:  70.67 kg (155 lb 12.8 oz)  71.7 kg (158 lb 1.1 oz)  SpO2: 98% 93% 94% 96%    Wt Readings from Last 3 Encounters:  08/25/14 71.7 kg (158 lb 1.1 oz)     Intake/Output Summary (Last 24 hours) at 08/25/14 1102 Last data filed at 08/25/14 1006  Gross per 24 hour  Intake    610 ml  Output    780 ml  Net   -170 ml    Exam  General: Well developed, well nourished, NAD, appears stated age  HEENT: NCAT,  mucous membranes moist. Small laceration on right eyebrow  Cardiovascular: S1 S2 auscultated, no rubs, murmurs or gallops. Regular rate and rhythm.  Respiratory: Rales, improving.  Slight  expiratory wheezing.   Abdomen: Soft, nontender, nondistended, + bowel sounds  Extremities: warm dry without cyanosis clubbing or edema  Neuro: AAOx3, no focal deficits  Data Review   Micro Results Recent Results (from the past 240 hour(s))  CULTURE, BLOOD (ROUTINE X 2)     Status: None   Collection Time    08/24/14  4:16 AM      Result Value Ref Range Status   Specimen Description BLOOD LEFT  FOREARM   Final   Special Requests BOTTLES DRAWN AEROBIC AND ANAEROBIC 10CC EACH   Final   Culture  Setup Time     Final   Value: 08/24/2014 08:59     Performed at [facility]   Culture     Final   Value:        BLOOD CULTURE RECEIVED NO GROWTH TO DATE CULTURE WILL BE HELD FOR 5 DAYS BEFORE ISSUING A FINAL NEGATIVE REPORT     Performed at [facility]   Report Status PENDING   Incomplete  CULTURE, BLOOD (ROUTINE X 2)     Status: None   Collection Time    08/24/14  4:21 AM      Result Value Ref Range Status   Specimen Description BLOOD LEFT FOREARM   Final   Special Requests BOTTLES DRAWN AEROBIC AND ANAEROBIC 10CC EA   Final   Culture  Setup Time     Final   Value: 08/24/2014 08:57     Performed at [facility]   Culture     Final   Value:        BLOOD CULTURE RECEIVED NO GROWTH TO DATE CULTURE WILL BE HELD FOR 5 DAYS BEFORE ISSUING A FINAL NEGATIVE REPORT     Performed at [facility]   Report Status PENDING   Incomplete  URINE CULTURE     Status: None   Collection Time    08/24/14  9:35 AM      Result Value Ref Range Status   Specimen Description URINE, CLEAN CATCH   Final   Special Requests NONE   Final   Culture  Setup Time     Final   Value: 08/24/2014 10:50     Performed at [facility] Count     Final   Value: NO GROWTH     Performed at [facility]   Culture     Final   Value: NO GROWTH     Performed at [facility]   Report Status 08/25/2014 FINAL   Final  CULTURE, EXPECTORATED SPUTUM-ASSESSMENT      Status: None   Collection Time    08/25/14  4:41 AM      Result Value Ref Range Status   Specimen Description SPUTUM   Final   Special Requests NONE   Final   Sputum evaluation     Final   Value: THIS SPECIMEN IS ACCEPTABLE. RESPIRATORY CULTURE REPORT TO FOLLOW.   Report Status 08/25/2014 FINAL   Final  CULTURE, RESPIRATORY (NON-EXPECTORATED)     Status: None   Collection Time    08/25/14  4:41 AM      Result Value Ref Range Status   Specimen Description SPUTUM   Final   Special Requests ADDED 0518   Final   Gram Stain     Final   Value: MODERATE WBC PRESENT,BOTH PMN AND MONONUCLEAR     RARE SQUAMOUS EPITHELIAL CELLS PRESENT     RARE GRAM POSITIVE COCCI     IN PAIRS RARE GRAM NEGATIVE RODS     Performed at [facility]   Culture PENDING   Incomplete   Report Status PENDING   Incomplete    Radiology Reports Dg Chest 2 View  08/03/2014   CLINICAL DATA:  Shortness of breath, cough  EXAM: CHEST  2 VIEW  COMPARISON:  None.  FINDINGS: Chronic interstitial markings/emphysematous changes. No focal consolidation. No pleural effusion or  pneumothorax.  The heart is normal in size.  Mild degenerative changes of the visualized thoracolumbar spine. Lower thoracolumbar space spine fixation hardware.  IVC filter, incompletely visualized on the lateral view.  IMPRESSION: No evidence of acute cardiopulmonary disease.   Electronically Signed   By: Charline BillsSriyesh  Krishnan M.D.   On: 08/03/2014 21:43   Ct Head Wo Contrast  08/24/2014   CLINICAL DATA:  Fall to floor with laceration.  Initial encounter.  EXAM: CT HEAD WITHOUT CONTRAST  TECHNIQUE: Contiguous axial images were obtained from the base of the skull through the vertex without intravenous contrast.  COMPARISON:  None.  FINDINGS: Skull and Sinuses:Negative for acute fracture.  There is diffuse inflammatory mucosal thickening in the paranasal sinuses, with mucosal thickening marked in the bilateral maxillary sinuses (where there are also  effusions).  Orbits: Dysconjugate gaze which correlates with history of altered mental status.  Incisive canal cyst.  Brain: No evidence of acute abnormality, such as acute infarction, hemorrhage, hydrocephalus, or shift.  There is cerebral volume loss which is mildly advanced for age.  12 mm mass lesion near the left caudothalamic groove, extending into the frontal horn left lateral ventricle. There is coarse calcifications within the mass. No other periventricular nodularity suggestive of tuberous sclerosis. No other intracranial calcifications suggestive of remote granulomatous infection. The mass is near the foramen of Monro, but there is no ventricular obstruction.  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 The left putamen is also somewhat difficult to visualize, possibly related to the same.  IMPRESSION: 1. No acute intracranial injury. 2. 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 Recommend nonemergent brain MRI with contrast. 3. Diffuse sinusitis.   Electronically Signed   By: Tiburcio PeaJonathan  Watts M.D.   On: 08/24/2014 06:56   Dg Chest Port 1 View  08/24/2014   CLINICAL DATA:  Fall.  Code sepsis.  Initial encounter.  EXAM: PORTABLE CHEST - 1 VIEW  COMPARISON:  08/03/2014  FINDINGS: There is extensive airspace disease throughout the right lung, especially in the lower lobe and middle lobe. There is mild coarsening of lung markings at the left base. Normal heart size and mediastinal contours for technique. No effusion, edema, or pneumothorax. Remote left-sided rib fractures; remote thoracolumbar posterior fixation. No acute osseous findings.  IMPRESSION: Multi lobar pneumonia on the right.   Electronically Signed   By: Tiburcio PeaJonathan  Watts M.D.   On: 08/24/2014 04:40    CBC  Recent Labs Lab 08/24/14 0412 08/25/14 0212  WBC 15.8* 11.3*  HGB 10.3* 9.2*  HCT 30.7* 27.4*  PLT 144*  133*  MCV 81.2 81.8  MCH 27.2 27.5  MCHC 33.6 33.6  RDW 14.5 14.6  LYMPHSABS 0.5* 0.5*  MONOABS 1.1* 0.6  EOSABS 0.0 0.0  BASOSABS 0.0 0.0    Chemistries   Recent Labs Lab 08/24/14 0412 08/25/14 0212  NA 131* 134*  K 3.6* 4.2  CL 93* 97  CO2 26 28  GLUCOSE 143* 171*  BUN 11 10  CREATININE 0.66 0.60  CALCIUM 8.2* 8.5  AST 39* 41*  ALT 40 40  ALKPHOS 200* 162*  BILITOT 2.0* 0.7   ------------------------------------------------------------------------------------------------------------------ estimated creatinine clearance is 106.8 ml/min (by C-G formula based on Cr of 0.6). ------------------------------------------------------------------------------------------------------------------ No results found for this basename: HGBA1C,  in the last 72 hours ------------------------------------------------------------------------------------------------------------------ No results found for this basename: CHOL, HDL, LDLCALC, TRIG, CHOLHDL, LDLDIRECT,  in the last 72 hours ------------------------------------------------------------------------------------------------------------------ No results found for this basename: TSH, T4TOTAL, FREET3, T3FREE, THYROIDAB,  in the last 72 hours ------------------------------------------------------------------------------------------------------------------  Recent Labs  08/24/14 1450  VITAMINB12 689  FOLATE 12.6  FERRITIN 1107*  TIBC 182*  IRON 11*  RETICCTPCT 1.6    Coagulation profile  Recent Labs Lab 08/24/14 1450  INR 1.27    No results found for this basename: DDIMER,  in the last 72 hours  Cardiac Enzymes  Recent Labs Lab 08/24/14 1450 08/24/14 1959 08/25/14 0212  TROPONINI <0.30 <0.30 <0.30   ------------------------------------------------------------------------------------------------------------------ No components found with this basename: POCBNP,     Julea Hutto D.O. on 08/25/2014 at 11:02  AM  Between 7am to 7pm - Pager - [phone]  After 7pm go to [email or web address] - password [email or web address]  And look for the night coverage person covering for me after hours  [facility] Office  [phone]

## 2014-08-25 NOTE — Progress Notes (Signed)
UR completed Amarria Andreasen K. Elfida Shimada, RN, BSN, MSHL, CCM  08/25/2014 5:32 PM

## 2014-08-25 NOTE — Care Management Note (Addendum)
    Page 1 of 1   08/28/2014     4:12:35 PM CARE MANAGEMENT NOTE 08/28/2014  Patient:  Ethan [name],Ethan [name]   Account Number:  [id number]  Date Initiated:  08/25/2014  Documentation initiated by:  Letha CapeAYLOR,Dae Antonucci  Subjective/Objective Assessment:   dx sepsis  admit- from [facility].     Action/Plan:   pt eval- rec snf- patient wants to return to shelter.   Anticipated DC Date:  08/29/2014   Anticipated DC Plan:  SKILLED NURSING FACILITY  In-house referral  Clinical Social Worker      DC Planning Services  CM consult      Choice offered to / List presented to:             Status of service:  Completed, signed off Medicare Important Message given?  NO (If response is "NO", the following Medicare IM given date fields will be blank) Date Medicare IM given:   Medicare IM given by:   Date Additional Medicare IM given:   Additional Medicare IM given by:    Discharge Disposition:  SKILLED NURSING FACILITY  Per UR Regulation:  Reviewed for med. necessity/level of care/duration of stay  If discussed at Long Length of Stay Meetings, dates discussed:    Comments:  08/28/14 1611 Letha Capeeborah Millette Halberstam RN, BSN [phone] patient for dc to snf this weekend.  08/26/14- 1320- Donn PieriniKristi Webster RN, BSN [phone] Spoke with pt at bedside- per conversation- pt states that he uses local pharmacies to get meds with his Medicaid benefits- usually pays $3 -  [facility] that [facility]- will need transportation- awaiting rollator delivery- no other needs identified.  08/25/14 1739 Letha Capeeborah Shalyn Koral RN, BSN [phone] patient would like to have a rollator.  Patient may need ast with medications.  NCM will continue to follow for dc needs.

## 2014-08-25 NOTE — Progress Notes (Signed)
Found cigarettes and a lighter in the patients room. Placed in medication drawer with patient sticker.

## 2014-08-25 NOTE — Clinical Social Work Psychosocial (Signed)
Clinical Social Work Department BRIEF PSYCHOSOCIAL ASSESSMENT 08/25/2014  Patient:  Ethan [name], Ethan [name]     Account Number:  [id number]     [place] date:  08/24/2014  Clinical Social Worker:  Lovey Newcomer  Date/Time:  08/25/2014 12:30 PM  Referred by:  Physician  Date Referred:  08/25/2014 Referred for  Homelessness   Other Referral:   NA   Interview type:  Patient Other interview type:   Patient alert and oriented at time of assessment.    PSYCHOSOCIAL DATA Living Status:  ALONE Admitted from facility:   Level of care:   Primary support name:  Patient reports he has no support. Primary support relationship to patient:  NONE Degree of support available:   Patient has poor support.    CURRENT CONCERNS Current Concerns  Other - See comment   Other Concerns:    SOCIAL WORK ASSESSMENT / PLAN CSW met with patient at bedside as CSW was consulted for homeless issues. Patient states that he has been staying at the [facility] [employer]. The patient reports that he is aware of the resources available to the homeless population in [facility] but still accepted the list of shelter CSW offered. Patient states that he has used 5.5 weeks of his time at [facility] and that the shelter is working to assist him with finding permanent housing. The patient states that he plans to return to the shelter at discharge and has been maintaining contact with the shelter.   Assessment/plan status:  Psychosocial Support/Ongoing Assessment of Needs Other assessment/ plan:   Patient requests assistance with getting a new walker. RNCM has been notified.   Information/referral to community resources:   NONE    PATIENT'S/FAMILY'S RESPONSE TO PLAN OF CARE: Patient plans to DC back to shelter once medically stable. No other CSW needs noted. CSW signing off at this time.       Liz Beach MSW, [place], [place], [phone]

## 2014-08-25 NOTE — Evaluation (Signed)
Physical Therapy Evaluation Patient Details Name: Ethan [name] MRN: [id number] DOB: Sep 12, 1961 Today's Date: 08/25/2014   History of Present Illness  Ethan MoodJackie R Cerreta is a 53 y.o. male with PMH of back pain, who presented with fever cough, chest pain and AMS. Upon admission, patient had altered mental status an did not answer questions consistently. The history was obtained both from patient himself and also from ED physician. Patient is homeless and is currently living in shelter home. About 5 days ago, he started coughing with yellow colored sputum. He denied chest pain and SOB, but his oxygen saturation decreased to 70% which responded to nonrebreather, and improved to above 90% in ED. He was febrile with temperature 102.2 in ED. He denied nausea, vomiting, abdominal pain or diarrhea. He had bilateral leg edema. CXR showed multifocal lobar pneumonia on the right. WBC 15.8. He was found to have elevated proBNP 1367, negative troponin. He was admitted to inpatient for further evaluation and treatment.     Clinical Impression  Patient presents with functional limitations due to deficits listed in PT problem list (see below). Pt with generalized weakness and impaired muscular/cardiovascular endurance resulting in drop in Sa02 during mobility and need for seated rest break during ambulation bout. Education provided in pursed lip breathing to improve ventilation and oxygen saturation. Concerned about pt's rollator as the brakes are broken. Pt may need supplemental 02 at discharge pending improvement. Pt from homeless shelter and wishes to return there. Pt would benefit from acute PT to improve transfers, gait, endurance and overall safe mobility so pt can maximize independence and return to PLOF.    Follow Up Recommendations SNF;Other (comment) (Pt reports wanting to return to homeless shelter.)    Equipment Recommendations  Other (comment) (rollator.)    Recommendations for Other Services        Precautions / Restrictions Precautions Precautions: Fall Precaution Booklet Issued: No Precaution Comments: monitor 02 Restrictions Weight Bearing Restrictions: No      Mobility  Bed Mobility Overal bed mobility: Needs Assistance Bed Mobility: Supine to Sit     Supine to sit: Supervision;HOB elevated     General bed mobility comments: Use of rails for support. Increased time.  Transfers Overall transfer level: Needs assistance Equipment used: 4-wheeled walker Transfers: Sit to/from Stand Sit to Stand: Min guard         General transfer comment: Stood from EOB x3. Min guard for safety and therapist stabilizing rollator as brakes do not work.  Ambulation/Gait Ambulation/Gait assistance: Min guard Ambulation Distance (Feet): 100 Feet (+ 50') Assistive device: 4-wheeled walker Gait Pattern/deviations: Step-through pattern;Decreased stride length;Trunk flexed Gait velocity: decreased   General Gait Details: Severe forward flexed posture (due to back surgery). Unsteady. Fatigues quickly. Dypsnea noted requiring 1 seated rest breaks. Sa02 decreased to 76% on 4L 02 [place]. Maintained 90% when increased to 6L 02 [place]. VC for pursed lip breathing.  Stairs            Wheelchair Mobility    Modified Rankin (Stroke Patients Only)       Balance Overall balance assessment: Needs assistance   Sitting balance-Leahy Scale: Good Sitting balance - Comments: Able to donn shoes sitting EOB reaching outside BOs and shifting weight without difficulty.    Standing balance support: During functional activity;Bilateral upper extremity supported Standing balance-Leahy Scale: Poor Standing balance comment: Requires BUE support using rollator for balance/safety.  Pertinent Vitals/Pain Pain Assessment: 0-10 Pain Score:  (not rated on pain scale.) Pain Location: back Pain Descriptors / Indicators: Sore;Aching Pain Intervention(s):  Monitored during session;Repositioned    Home Living Family/patient expects to be discharged to:: Shelter/Homeless                 Additional Comments: [facility] living [facility]    Prior Function Level of Independence: Independent with assistive device(s)         Comments: Uses rollator for ambulation. Reports only able to ambulate 100' at a time prior to rest break. Rollator's brakes do not work.     Hand Dominance        Extremity/Trunk Assessment   Upper Extremity Assessment: Generalized weakness           Lower Extremity Assessment: Generalized weakness         Communication   Communication: No difficulties  Cognition Arousal/Alertness: Awake/alert Behavior During Therapy: WFL for tasks assessed/performed Overall Cognitive Status: Within Functional Limits for tasks assessed                      General Comments General comments (skin integrity, edema, etc.): Pt asymptomatic with drop in Sa02 during mobility.     Exercises        Assessment/Plan    PT Assessment Patient needs continued PT services  PT Diagnosis Difficulty walking;Generalized weakness   PT Problem List Decreased strength;Cardiopulmonary status limiting activity;Pain;Decreased activity tolerance;Decreased balance;Decreased safety awareness;Decreased mobility  PT Treatment Interventions Balance training;Gait training;Patient/family education;Functional mobility training;Therapeutic exercise;Therapeutic activities   PT Goals (Current goals can be found in the Care Plan section) Acute Rehab PT Goals Patient Stated Goal: get back on my feet PT Goal Formulation: With patient Time For Goal Achievement: 09/08/14 Potential to Achieve Goals: Good    Frequency Min 3X/week   Barriers to discharge Decreased caregiver support Pt from homeless shelter    Co-evaluation               End of Session Equipment Utilized During Treatment: Gait belt;Oxygen Activity Tolerance:  Patient limited by fatigue;Other (comment) (Limited due to drop in oxygen saturation.) Patient left: in chair;with call bell/phone within reach;with nursing/sitter in room Nurse Communication: Mobility status;Precautions         Time: [id number] PT Time Calculation (min): 32 min   Charges:   PT Evaluation $Initial PT Evaluation Tier I: 1 Procedure PT Treatments $Gait Training: 8-22 mins   PT G CodesAlvie Heidelberg:          Folan, Saraphina Lauderbaugh A 08/25/2014, 3:58 PM Alvie HeidelbergShauna Folan, PT, DPT [phone]

## 2014-08-25 NOTE — Evaluation (Signed)
Occupational Therapy Evaluation Patient Details Name: Ethan [name] MRN: [id number] DOB: 1961/10/24 Today's Date: 08/25/2014    History of Present Illness Ethan [name] is a 53 y.o. male with PMH of back pain, who presented with fever cough, chest pain and AMS. Upon admission, patient had altered mental status an did not answer questions consistently. The history was obtained both from patient himself and also from ED physician. Patient is homeless and is currently living in shelter home. About 5 days ago, he started coughing with yellow colored sputum. He denied chest pain and SOB, but his oxygen saturation decreased to 70% which responded to nonrebreather, and improved to above 90% in ED. He was febrile with temperature 102.2 in ED. He denied nausea, vomiting, abdominal pain or diarrhea. He had bilateral leg edema. CXR showed multifocal lobar pneumonia on the right. WBC 15.8. He was found to have elevated proBNP 1367, negative troponin. He was admitted to inpatient for further evaluation and treatment.     Clinical Impression   Pt admitted with PNA. Pt currently with functional limitations due to the deficits listed below (see OT Problem List).  Pt will benefit from skilled OT to increase their safety and independence with ADL and functional mobility for ADL to facilitate discharge to venue listed below.     Follow Up Recommendations  SNF or homeless shelter?   Equipment Recommendations  None recommended by OT    Recommendations for Other Services PT consult     Precautions / Restrictions Precautions Precaution Comments: pt stands in flexed posture position. Pt had back surgery 4 years ago Restrictions Weight Bearing Restrictions: No      Mobility Bed Mobility Overal bed mobility: Needs Assistance Bed Mobility: Supine to Sit     Supine to sit: Supervision        Transfers Overall transfer level: Needs assistance Equipment used: 4-wheeled walker Transfers: Sit  to/from Stand Sit to Stand: Min assist                   ADL Overall ADL's : Needs assistance/impaired Eating/Feeding: Set up;Sitting   Grooming: Sitting;Set up   Upper Body Bathing: Minimal assitance;Sitting   Lower Body Bathing: Sit to/from stand;Moderate assistance   Upper Body Dressing : Minimal assistance;Sitting   Lower Body Dressing: Sit to/from stand;Moderate assistance   Toilet Transfer: Minimal assistance Toilet Transfer Details (indicate cue type and reason): sit to stand for use of urinal Toileting- Clothing Manipulation and Hygiene: Sit to/from stand;Minimal assistance       Functional mobility during ADLs: Minimal assistance;Rolling walker General ADL Comments: pts walker very unsafe as brakes do not work               Pertinent Vitals/Pain Pain Assessment: 0-10 Pain Score: 4  Pain Location: back Pain Descriptors / Indicators: Aching        Extremity/Trunk Assessment Upper Extremity Assessment Upper Extremity Assessment: Generalized weakness           Communication Communication Communication: No difficulties   Cognition Arousal/Alertness: Awake/alert Behavior During Therapy: WFL for tasks assessed/performed Overall Cognitive Status: Within Functional Limits for tasks assessed                     General Comments    Pts walker very unsafe.  Brakes do not work. Pt is not open to using a basic walker with wheels            Home Living Family/patient expects to be discharged to::  Shelter/Homeless                                 Additional Comments: [facility]      Prior Functioning/Environment Level of Independence: Independent with assistive device(s)             OT Diagnosis: Generalized weakness;Acute pain   OT Problem List: Decreased strength;Decreased activity tolerance;Impaired balance (sitting and/or standing)   OT Treatment/Interventions: Self-care/ADL training;DME and/or AE  instruction;Patient/family education    OT Goals(Current goals can be found in the care plan section) Acute Rehab OT Goals Patient Stated Goal: get back on my feet OT Goal Formulation: With patient Time For Goal Achievement: 09/08/14 Potential to Achieve Goals: Good ADL Goals Pt Will Perform Grooming: with modified independence;standing Pt Will Perform Lower Body Dressing: with modified independence;sit to/from stand Pt Will Transfer to Toilet: with modified independence;ambulating;regular height toilet Pt Will Perform Toileting - Clothing Manipulation and hygiene: with modified independence;sit to/from stand  OT Frequency: Min 2X/week   Barriers to [name]/C: Decreased caregiver support             End of Session Equipment Utilized During Treatment: Rolling walker Nurse Communication: Mobility status  Activity Tolerance: Patient tolerated treatment well Patient left: in bed;with call bell/phone within reach   Time: 0920-0950 OT Time Calculation (min): 30 min Charges:  OT General Charges $OT Visit: 1 Procedure OT Evaluation $Initial OT Evaluation Tier I: 1 Procedure OT Treatments $Self Care/Home Management : 8-22 mins G-Codes:    Ethan CrowEDDING, Ethan [name] 08/25/2014, 10:31 AM
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
## 2014-08-26 DIAGNOSIS — I059 Rheumatic mitral valve disease, unspecified: Secondary | ICD-10-CM

## 2014-08-26 LAB — RESPIRATORY VIRUS PANEL
ADENOVIRUS: NOT DETECTED
INFLUENZA A H1: NOT DETECTED
INFLUENZA B 1: NOT DETECTED
Influenza A H3: NOT DETECTED
Influenza A: NOT DETECTED
Metapneumovirus: NOT DETECTED
PARAINFLUENZA 3 A: NOT DETECTED
Parainfluenza 1: NOT DETECTED
Parainfluenza 2: NOT DETECTED
Respiratory Syncytial Virus A: NOT DETECTED
Respiratory Syncytial Virus B: NOT DETECTED
Rhinovirus: DETECTED — AB

## 2014-08-26 LAB — CBC
HCT: 30.1 % — ABNORMAL LOW (ref 39.0–52.0)
HEMOGLOBIN: 9.9 g/dL — AB (ref 13.0–17.0)
MCH: 27.3 pg (ref 26.0–34.0)
MCHC: 32.9 g/dL (ref 30.0–36.0)
MCV: 82.9 fL (ref 78.0–100.0)
RBC: 3.63 MIL/uL — ABNORMAL LOW (ref 4.22–5.81)
RDW: 14.7 % (ref 11.5–15.5)
WBC: 9.6 10*3/uL (ref 4.0–10.5)

## 2014-08-26 LAB — BASIC METABOLIC PANEL
Anion gap: 11 (ref 5–15)
BUN: 8 mg/dL (ref 6–23)
CALCIUM: 8.5 mg/dL (ref 8.4–10.5)
CO2: 29 mEq/L (ref 19–32)
Chloride: 96 mEq/L (ref 96–112)
Creatinine, Ser: 0.64 mg/dL (ref 0.50–1.35)
Glucose, Bld: 101 mg/dL — ABNORMAL HIGH (ref 70–99)
Potassium: 3.7 mEq/L (ref 3.7–5.3)
Sodium: 136 mEq/L — ABNORMAL LOW (ref 137–147)

## 2014-08-26 NOTE — Clinical Documentation Improvement (Signed)
Possible Clinical Conditions?  Acute Diastolic Congestive Heart Failure Acute Systolic & Diastolic Congestive Heart Failure Acute on Chronic Diastolic Congestive Heart Failure Acute on Chronic Systolic & Diastolic Congestive Heart Failure Other Condition Cannot Clinically Determine   Risk Factors: Bilateral leg edema likely due to CHF per 10/12 progress notes. IVF: 2700 NS given in the ED; will continue low rate of NS @ 75cc/hr given possible CHF per 10/12 progress notes.  Diagnostics: 10/12: proBNP: 1367.0. 10/12: 2D echo: acute diastolic heart failure.   Thank You, Marciano SequinWanda Mathews-Bethea,RN,BSN, Clinical Documentation Specialist:  [phone]  [facility]- Health Information Management

## 2014-08-26 NOTE — Progress Notes (Signed)
  Echocardiogram 2D Echocardiogram has been performed.  Janalyn HarderWest, Fariha Goto R 08/26/2014, 10:59 AM

## 2014-08-26 NOTE — Progress Notes (Signed)
Physical Therapy Treatment Patient Details Name: Ethan MoodJackie R Brick MRN: [id number] DOB: [birth year] Today's Date: 08/26/2014    History of Present Illness Ethan [name] is a 53 y.o. male with PMH of back pain, who presented with fever cough, chest pain and AMS. Upon admission, patient had altered mental status an did not answer questions consistently. The history was obtained both from patient himself and also from ED physician. Patient is homeless and is currently living in shelter home. About 5 days ago, he started coughing with yellow colored sputum. He denied chest pain and SOB, but his oxygen saturation decreased to 70% which responded to nonrebreather, and improved to above 90% in ED. He was febrile with temperature 102.2 in ED. He denied nausea, vomiting, abdominal pain or diarrhea. He had bilateral leg edema. CXR showed multifocal lobar pneumonia on the right. WBC 15.8. He was found to have elevated proBNP 1367, negative troponin. He was admitted to inpatient for further evaluation and treatment.      PT Comments    Patient progressing well with mobility. Cardiovascular endurance seems improved as pt tolerated increase in ambulation distance and able to maintain Sa02 ~89% on 3L 02 [place]. Continues to require longer seated rest breaks due to fatigue and dyspnea. Will continue to wean down oxygen as tolerated as pt will not be able to return to homeless shelter on supplemental 02. Will continue to follow and progress as tolerated.   Follow Up Recommendations  SNF;Other (comment) Wants to return to homeless shelter.     Equipment Recommendations  Other (comment)    Recommendations for Other Services       Precautions / Restrictions Precautions Precautions: Fall Precaution Comments: monitor 02 Restrictions Weight Bearing Restrictions: No    Mobility  Bed Mobility Overal bed mobility: Needs Assistance Bed Mobility: Supine to Sit;Sit to Supine     Supine to sit: Supervision;HOB  elevated Sit to supine: Supervision;HOB elevated   General bed mobility comments: Use of rails for support. Increased time.  Transfers Overall transfer level: Needs assistance Equipment used: 4-wheeled walker Transfers: Sit to/from Stand Sit to Stand: Min guard         General transfer comment: Min guard for safety and therapist stabilizing rollator as brakes are broken.  Ambulation/Gait Ambulation/Gait assistance: Min guard Ambulation Distance (Feet): 150 Feet (+ 150' with 1 seated rest break.) Assistive device: 4-wheeled walker Gait Pattern/deviations: Step-through pattern;Decreased stride length;Trunk flexed Gait velocity: decreased   General Gait Details: Severe forward flexed posture (due to back surgery). More steady today. Ambulated on 3L 02 [place], Difficult to obtain Sa02 due to poor circulation, ~89% post ambulation bout. VC to not lean on rollator handles when fatigued for safety. 1 seated rest break btw bouts. VC for pursed lip breathing.   Stairs            Wheelchair Mobility    Modified Rankin (Stroke Patients Only)       Balance Overall balance assessment: Needs assistance   Sitting balance-Leahy Scale: Good Sitting balance - Comments: Able to donn shoes sitting EOB reaching outside BOs and shifting weight without difficulty.    Standing balance support: During functional activity;Bilateral upper extremity supported Standing balance-Leahy Scale: Poor Standing balance comment: Requires BUE support using rollator for balance/safety. Able to wash hands in standing reaching outside BoS to grab towels/soap etc without LOB.                    Cognition Arousal/Alertness: Awake/alert Behavior During Therapy: [facility]  for tasks assessed/performed Overall Cognitive Status: Within Functional Limits for tasks assessed                      Exercises      General Comments General comments (skin integrity, edema, etc.): Difficult to obtain oxygen  saturation due to poor circulation. Sa02 ranged from 89%-95% on 3L 02 [place].      Pertinent Vitals/Pain Pain Assessment: No/denies pain    Home Living                      Prior Function            PT Goals (current goals can now be found in the care plan section) Progress towards PT goals: Progressing toward goals    Frequency       PT Plan Current plan remains appropriate    Co-evaluation             End of Session Equipment Utilized During Treatment: Gait belt;Oxygen Activity Tolerance: Patient tolerated treatment well Patient left: in bed;with call bell/phone within reach;with bed alarm set     Time: 1450-1525 PT Time Calculation (min): 35 min  Charges:  $Gait Training: 23-37 mins                    G CodesAlvie Heidelberg:      Folan, Estoria Geary A 08/26/2014, 4:28 PM Alvie HeidelbergShauna Folan, PT, DPT [phone]

## 2014-08-26 NOTE — Progress Notes (Signed)
[facility]                                                                              Patient Demographics  Ethan [name], is a 53 y.o. male, DOB - [birth year], BOF:[id number]  Admit date - 08/24/2014   Admitting Physician Lorretta Harp, MD  Outpatient Primary MD for the patient is Burtis Junes, MD  LOS - 2   Chief Complaint  Patient presents with  . Fall      HPI on 08/24/2014  Ethan [name] is a 53 y.o. male with PMH of back pain, who presented with fever cough, chest pain and AMS. Upon admission, patient had altered mental status an did not answer questions consistently. The history was obtained both from patient himself and also from ED physician. Patient is homeless and is currently living in shelter home. About 5 days ago, he started coughing with yellow colored sputum. He denied chest pain and SOB, but his oxygen saturation decreased to 70% which responded to nonrebreather, and improved to above 90% in ED. He was febrile with temperature 102.2 in ED. He denied nausea, vomiting, abdominal pain or diarrhea. He had bilateral leg edema. CXR showed multifocal lobar pneumonia on the right. WBC 15.8. He was found to have elevated proBNP 1367, negative troponin. He was admitted to inpatient for further evaluation and treatment.   Interim History Admitted for sepsis secondary to CAP, improving.  Homeless, PT and OT consulted.  SW also consulted for placement.  Mass noted on CT, pending MRI.   Assessment & Plan  Severe sepsis secondary to community-acquired pneumonia  -Upon admission, patient was febrile, with leukocytosis as well as tachycardia and tachypnea.  -Sepsis appears to be improving, leukocytosis trending downward, patient has been afebrile -Chest x-ray: Multi lobar pneumonia on the right.  -Patient did have some altered mental status however seems to be back at baseline.  -Blood cultures obtained on 08/24/2014 showing no growth to date -Will repeat CXR in  am -Will continue IV antibiotic therapy, follow up on am CXR, consider narrowing antibiotic spectrum in the next 24 hours.   Acute hypoxic respiratory failure  -Likely secondary to CAP -Oxygen saturation was 71% in the ER  -Showing clinical improvement, will wean supplemental oxygen  Acute encephalopathy  -Secondary to community acquired pneumonia and hypoxia  -Improved, patient appears to be at his baseline  -CT head: No acute intracranial injury.  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 -Pending MRI of brain  Bilateral lower extremity edema  -BNP 1367  -Echocardiogram pending   Normocytic anemia  -Anemia panel: Iron 11, ferritin 1107 (adequate stores) -Drop in Hb likely dilutional, will continue to monitor CBC -Continue supplementation   Homeless  -Social work consult   Tobacco Abuse -Smoking cessation counseling given -Nicotine patch  Deconditioning -PT and OT consulted -OT recommended SNF -Pending PT evaluation  Code Status: Full   Family Communication: none at bedside   Disposition Plan: Admitted  Time Spent in minutes 30 minutes   Procedures  None   Consults  none   DVT Prophylaxis Heparin  Lab Results  Component Value Date  PLT PLATELET CLUMPING, SUGGEST RECOLLECTION OF SAMPLE IN CITRATE TUBE. 08/26/2014    Medications  Scheduled Meds: . azithromycin  500 mg Intravenous Q24H  . feeding supplement (ENSURE COMPLETE)  237 mL Oral BID BM  . heparin  5,000 Units Subcutaneous 3 times per day  . multivitamins with iron  1 tablet Oral Daily  . nicotine  21 mg Transdermal Daily  . piperacillin-tazobactam (ZOSYN)  IV  3.375 g Intravenous 3 times per day  . polyethylene glycol  17 g Oral Daily  . vancomycin  1,000 mg Intravenous Q8H   Continuous Infusions:  PRN Meds:.acetaminophen, albuterol, oxycodone  Antibiotics    Anti-infectives   Start     Dose/Rate Route Frequency Ordered Stop    08/24/14 1800  azithromycin (ZITHROMAX) 500 mg in dextrose 5 % 250 mL IVPB     500 mg 250 mL/hr over 60 Minutes Intravenous Every 24 hours 08/24/14 1410     08/24/14 1500  piperacillin-tazobactam (ZOSYN) IVPB 3.375 g     3.375 g 12.5 mL/hr over 240 Minutes Intravenous 3 times per day 08/24/14 1430     08/24/14 1430  vancomycin (VANCOCIN) IVPB 1000 mg/200 mL premix     1,000 mg 200 mL/hr over 60 Minutes Intravenous Every 8 hours 08/24/14 1429     08/24/14 0430  vancomycin (VANCOCIN) 1,500 mg in sodium chloride 0.9 % 500 mL IVPB     1,500 mg 250 mL/hr over 120 Minutes Intravenous  Once 08/24/14 0427 08/24/14 0952   08/24/14 0430  piperacillin-tazobactam (ZOSYN) IVPB 3.375 g     3.375 g 100 mL/hr over 30 Minutes Intravenous  Once 08/24/14 0427 08/24/14 0511   08/24/14 0430  levofloxacin (LEVAQUIN) IVPB 750 mg     750 mg 100 mL/hr over 90 Minutes Intravenous  Once 08/24/14 0427 08/24/14 0930        Subjective:   Ethan [name] seen and examined today.  Patient states he still feels able. He does not really remember yesterday. Patient currently has no complaints of chest pain, abdominal pain. He continues to cough.    Objective:   Filed Vitals:   08/25/14 1352 08/25/14 2202 08/26/14 0545 08/26/14 1426  BP: 119/70 125/70 147/84 122/62  Pulse: 82 97 95 82  Temp: 97.6 F (36.4 C) 99.2 F (37.3 C) 99.8 F (37.7 C) 98.8 F (37.1 C)  TempSrc: Oral Oral Oral Oral  Resp: 16 16 16 20   Height:      Weight:      SpO2: 96% 95% 93% 94%    Wt Readings from Last 3 Encounters:  08/25/14 71.7 kg (158 lb 1.1 oz)     Intake/Output Summary (Last 24 hours) at 08/26/14 1746 Last data filed at 08/26/14 1332  Gross per 24 hour  Intake    490 ml  Output   2250 ml  Net  -1760 ml    Exam  General: Well developed, well nourished, NAD, appears stated age  HEENT: NCAT,  mucous membranes moist. Small laceration on right eyebrow  Cardiovascular: S1 S2 auscultated, no rubs, murmurs or  gallops. Regular rate and rhythm.  Respiratory: Rales, improving.  Slight expiratory wheezing.   Abdomen: Soft, nontender, nondistended, + bowel sounds  Extremities: warm dry without cyanosis clubbing or edema  Neuro: AAOx3, no focal deficits  Data Review   Micro Results Recent Results (from the past 240 hour(s))  CULTURE, BLOOD (ROUTINE X 2)     Status: None   Collection Time    08/24/14  4:16  AM      Result Value Ref Range Status   Specimen Description BLOOD LEFT FOREARM   Final   Special Requests BOTTLES DRAWN AEROBIC AND ANAEROBIC 10CC EACH   Final   Culture  Setup Time     Final   Value: 08/24/2014 08:59     Performed at [facility]   Culture     Final   Value:        BLOOD CULTURE RECEIVED NO GROWTH TO DATE CULTURE WILL BE HELD FOR 5 DAYS BEFORE ISSUING A FINAL NEGATIVE REPORT     Performed at [facility]   Report Status PENDING   Incomplete  CULTURE, BLOOD (ROUTINE X 2)     Status: None   Collection Time    08/24/14  4:21 AM      Result Value Ref Range Status   Specimen Description BLOOD LEFT FOREARM   Final   Special Requests BOTTLES DRAWN AEROBIC AND ANAEROBIC 10CC EA   Final   Culture  Setup Time     Final   Value: 08/24/2014 08:57     Performed at [facility]   Culture     Final   Value:        BLOOD CULTURE RECEIVED NO GROWTH TO DATE CULTURE WILL BE HELD FOR 5 DAYS BEFORE ISSUING A FINAL NEGATIVE REPORT     Performed at [facility]   Report Status PENDING   Incomplete  URINE CULTURE     Status: None   Collection Time    08/24/14  9:35 AM      Result Value Ref Range Status   Specimen Description URINE, CLEAN CATCH   Final   Special Requests NONE   Final   Culture  Setup Time     Final   Value: 08/24/2014 10:50     Performed at [facility] Count     Final   Value: NO GROWTH     Performed at [facility]   Culture     Final   Value: NO GROWTH     Performed at [facility]   Report  Status 08/25/2014 FINAL   Final  CULTURE, EXPECTORATED SPUTUM-ASSESSMENT     Status: None   Collection Time    08/25/14  4:41 AM      Result Value Ref Range Status   Specimen Description SPUTUM   Final   Special Requests NONE   Final   Sputum evaluation     Final   Value: THIS SPECIMEN IS ACCEPTABLE. RESPIRATORY CULTURE REPORT TO FOLLOW.   Report Status 08/25/2014 FINAL   Final  CULTURE, RESPIRATORY (NON-EXPECTORATED)     Status: None   Collection Time    08/25/14  4:41 AM      Result Value Ref Range Status   Specimen Description SPUTUM   Final   Special Requests ADDED 0518   Final   Gram Stain     Final   Value: MODERATE WBC PRESENT,BOTH PMN AND MONONUCLEAR     RARE SQUAMOUS EPITHELIAL CELLS PRESENT     RARE GRAM POSITIVE COCCI     IN PAIRS RARE GRAM NEGATIVE RODS     Performed at [facility]   Culture     Final   Value: Culture reincubated for better growth     Performed at [facility]   Report Status PENDING   Incomplete    Radiology Reports Dg Chest 2  View  08/03/2014   CLINICAL DATA:  Shortness of breath, cough  EXAM: CHEST  2 VIEW  COMPARISON:  None.  FINDINGS: Chronic interstitial markings/emphysematous changes. No focal consolidation. No pleural effusion or pneumothorax.  The heart is normal in size.  Mild degenerative changes of the visualized thoracolumbar spine. Lower thoracolumbar space spine fixation hardware.  IVC filter, incompletely visualized on the lateral view.  IMPRESSION: No evidence of acute cardiopulmonary disease.   Electronically Signed   By: Charline BillsSriyesh  Krishnan M.D.   On: 08/03/2014 21:43   Ct Head Wo Contrast  08/24/2014   CLINICAL DATA:  Fall to floor with laceration.  Initial encounter.  EXAM: CT HEAD WITHOUT CONTRAST  TECHNIQUE: Contiguous axial images were obtained from the base of the skull through the vertex without intravenous contrast.  COMPARISON:  None.  FINDINGS: Skull and Sinuses:Negative for acute fracture.  There is diffuse  inflammatory mucosal thickening in the paranasal sinuses, with mucosal thickening marked in the bilateral maxillary sinuses (where there are also effusions).  Orbits: Dysconjugate gaze which correlates with history of altered mental status.  Incisive canal cyst.  Brain: No evidence of acute abnormality, such as acute infarction, hemorrhage, hydrocephalus, or shift.  There is cerebral volume loss which is mildly advanced for age.  12 mm mass lesion near the left caudothalamic groove, extending into the frontal horn left lateral ventricle. There is coarse calcifications within the mass. No other periventricular nodularity suggestive of tuberous sclerosis. No other intracranial calcifications suggestive of remote granulomatous infection. The mass is near the foramen of Monro, but there is no ventricular obstruction.  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 The left putamen is also somewhat difficult to visualize, possibly related to the same.  IMPRESSION: 1. No acute intracranial injury. 2. 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 Recommend nonemergent brain MRI with contrast. 3. Diffuse sinusitis.   Electronically Signed   By: Tiburcio PeaJonathan  Watts M.D.   On: 08/24/2014 06:56   Dg Chest Port 1 View  08/24/2014   CLINICAL DATA:  Fall.  Code sepsis.  Initial encounter.  EXAM: PORTABLE CHEST - 1 VIEW  COMPARISON:  08/03/2014  FINDINGS: There is extensive airspace disease throughout the right lung, especially in the lower lobe and middle lobe. There is mild coarsening of lung markings at the left base. Normal heart size and mediastinal contours for technique. No effusion, edema, or pneumothorax. Remote left-sided rib fractures; remote thoracolumbar posterior fixation. No acute osseous findings.  IMPRESSION: Multi lobar pneumonia on the right.   Electronically Signed   By: Tiburcio PeaJonathan  Watts M.D.    On: 08/24/2014 04:40    CBC  Recent Labs Lab 08/24/14 0412 08/25/14 0212 08/26/14 0536  WBC 15.8* 11.3* 9.6  HGB 10.3* 9.2* 9.9*  HCT 30.7* 27.4* 30.1*  PLT 144* 133* PLATELET CLUMPING, SUGGEST RECOLLECTION OF SAMPLE IN CITRATE TUBE.  MCV 81.2 81.8 82.9  MCH 27.2 27.5 27.3  MCHC 33.6 33.6 32.9  RDW 14.5 14.6 14.7  LYMPHSABS 0.5* 0.5*  --   MONOABS 1.1* 0.6  --   EOSABS 0.0 0.0  --   BASOSABS 0.0 0.0  --     Chemistries   Recent Labs Lab 08/24/14 0412 08/25/14 0212 08/26/14 0536  NA 131* 134* 136*  K 3.6* 4.2 3.7  CL 93* 97 96  CO2 26 28 29   GLUCOSE 143* 171* 101*  BUN 11 10 8   CREATININE 0.66 0.60 0.64  CALCIUM 8.2* 8.5 8.5  AST 39* 41*  --   ALT 40 40  --   ALKPHOS 200* 162*  --   BILITOT 2.0* 0.7  --    ------------------------------------------------------------------------------------------------------------------ estimated creatinine clearance is 106.8 ml/min (by C-G formula based on Cr of 0.64). ------------------------------------------------------------------------------------------------------------------ No results found for this basename: HGBA1C,  in the last 72 hours ------------------------------------------------------------------------------------------------------------------ No results found for this basename: CHOL, HDL, LDLCALC, TRIG, CHOLHDL, LDLDIRECT,  in the last 72 hours ------------------------------------------------------------------------------------------------------------------ No results found for this basename: TSH, T4TOTAL, FREET3, T3FREE, THYROIDAB,  in the last 72 hours ------------------------------------------------------------------------------------------------------------------  Recent Labs  08/24/14 1450  VITAMINB12 689  FOLATE 12.6  FERRITIN 1107*  TIBC 182*  IRON 11*  RETICCTPCT 1.6    Coagulation profile  Recent Labs Lab 08/24/14 1450  INR 1.27    No results found for this basename: DDIMER,  in the  last 72 hours  Cardiac Enzymes  Recent Labs Lab 08/24/14 1450 08/24/14 1959 08/25/14 0212  TROPONINI <0.30 <0.30 <0.30   ------------------------------------------------------------------------------------------------------------------ No components found with this basename: POCBNP,     Eljay Lave D.O. on 08/26/2014 at 5:46 PM  Between 7am to 7pm - Pager - [phone]  After 7pm go to [email or web address] - password [email or web address]  And look for the night coverage person covering for me after hours  [facility] Office  [phone]

## 2014-08-27 ENCOUNTER — Inpatient Hospital Stay (HOSPITAL_COMMUNITY): Payer: Medicaid Other

## 2014-08-27 LAB — BASIC METABOLIC PANEL
ANION GAP: 13 (ref 5–15)
BUN: 8 mg/dL (ref 6–23)
CALCIUM: 8.7 mg/dL (ref 8.4–10.5)
CO2: 27 mEq/L (ref 19–32)
CREATININE: 0.65 mg/dL (ref 0.50–1.35)
Chloride: 91 mEq/L — ABNORMAL LOW (ref 96–112)
GFR calc Af Amer: >90 mL/min (ref >90)
Glucose, Bld: 111 mg/dL — ABNORMAL HIGH (ref 70–99)
Potassium: 3.9 mEq/L (ref 3.7–5.3)
Sodium: 131 mEq/L — ABNORMAL LOW (ref 137–147)

## 2014-08-27 LAB — EXPECTORATED SPUTUM ASSESSMENT W GRAM STAIN, RFLX TO RESP C

## 2014-08-27 LAB — CBC
HCT: 31.5 % — ABNORMAL LOW (ref 39.0–52.0)
Hemoglobin: 10.4 g/dL — ABNORMAL LOW (ref 13.0–17.0)
MCH: 27.3 pg (ref 26.0–34.0)
MCHC: 33 g/dL (ref 30.0–36.0)
MCV: 82.7 fL (ref 78.0–100.0)
Platelets: UNDETERMINED 10*3/uL (ref 150–400)
RBC: 3.81 MIL/uL — ABNORMAL LOW (ref 4.22–5.81)
RDW: 14.7 % (ref 11.5–15.5)
WBC: 12 10*3/uL — ABNORMAL HIGH (ref 4.0–10.5)

## 2014-08-27 LAB — CULTURE, RESPIRATORY: CULTURE: NORMAL

## 2014-08-27 LAB — CULTURE, RESPIRATORY W GRAM STAIN

## 2014-08-27 LAB — EXPECTORATED SPUTUM ASSESSMENT W REFEX TO RESP CULTURE

## 2014-08-27 LAB — VANCOMYCIN, TROUGH: Vancomycin Tr: 12.3 ug/mL (ref 10.0–20.0)

## 2014-08-27 MED ORDER — VANCOMYCIN HCL 10 G IV SOLR
1250.0000 mg | Freq: Three times a day (TID) | INTRAVENOUS | Status: DC
  Administered 2014-08-28 – 2014-08-29 (×5): 1250 mg via INTRAVENOUS
  Filled 2014-08-27 (×8): qty 1250

## 2014-08-27 MED ORDER — GADOBENATE DIMEGLUMINE 529 MG/ML IV SOLN
15.0000 mL | Freq: Once | INTRAVENOUS | Status: AC | PRN
  Administered 2014-08-27: 15 mL via INTRAVENOUS

## 2014-08-27 NOTE — Progress Notes (Signed)
ANTIBIOTIC CONSULT NOTE - FOLLOW UP  Pharmacy Consult for Vancomycin  Indication: rule out pneumonia and rule out sepsis  Patient Measurements: Height: 5\' 9"  (175.3 cm) Weight: 158 lb 1.1 oz (71.7 kg) IBW/kg (Calculated) : 70.7  Labs:  Recent Labs  08/25/14 0212 08/26/14 0536 08/27/14 0625  WBC 11.3* 9.6 12.0*  HGB 9.2* 9.9* 10.4*  PLT 133* PLATELET CLUMPING, SUGGEST RECOLLECTION OF SAMPLE IN CITRATE TUBE. PLATELET CLUMPS NOTED ON SMEAR, UNABLE TO ESTIMATE  CREATININE 0.60 0.64 0.65   Recent Labs  08/27/14 2141  VANCOTROUGH 12.3    Assessment: Sub-therapeutic vancomycin trough, drawn correctly  Goal of Therapy:  Vancomycin trough level 15-20 mcg/ml  Plan:  -Increase vancomycin to 1250 mg IV q8h -Re-check VT as steady state  Ethan [name], Ethan [name] 08/27/2014,11:25 PM

## 2014-08-27 NOTE — Progress Notes (Signed)
[facility]                                                                              Patient Demographics  Ethan [name], is a 53 y.o. male, DOB - 1961/07/29, LSL:[id number]  Admit date - 08/24/2014   Admitting Physician Ivor Costa, MD  Outpatient Primary MD for the patient is Elizabeth Palau, MD  LOS - 3   Chief Complaint  Patient presents with  . Fall      HPI on 08/24/2014  Ethan [name] is a 53 y.o. male with PMH of back pain, who presented with fever cough, chest pain and AMS. Upon admission, patient had altered mental status an did not answer questions consistently. The history was obtained both from patient himself and also from ED physician. Patient is homeless and is currently living in shelter home. About 5 days ago, he started coughing with yellow colored sputum. He denied chest pain and SOB, but his oxygen saturation decreased to 70% which responded to nonrebreather, and improved to above 90% in ED. He was febrile with temperature 102.2 in ED. He denied nausea, vomiting, abdominal pain or diarrhea. He had bilateral leg edema. CXR showed multifocal lobar pneumonia on the right. WBC 15.8. He was found to have elevated proBNP 1367, negative troponin. He was admitted to inpatient for further evaluation and treatment.   Interim History Admitted for sepsis secondary to CAP, improving.  Homeless, PT and OT consulted.  SW also consulted for placement.  Mass noted on CT, pending MRI.   Assessment & Plan  Severe sepsis secondary to community-acquired pneumonia  -Upon admission, patient was febrile, with leukocytosis as well as tachycardia and tachypnea.  -Sepsis appears to be improving, leukocytosis trending downward, patient has been afebrile -Chest x-ray: Multi lobar pneumonia on the right.  -Patient did have some altered mental status however seems to be back at baseline.  -Blood cultures obtained on 08/24/2014 showing no growth to date -Repeat chest x-ray  showing interim partial clearing of diffuse right lung infiltrate however new prominent left pulmonary infiltrate -Will continue IV antibiotic therapy with vancomycin and Zosyn, discontinue azithromycin -He continues to require 3 L of supplemental oxygen  Acute hypoxic respiratory failure  -Likely secondary to CAP -Oxygen saturation was 71% in the ER  -Showing clinical improvement, her requiring 3 L supplemental oxygen  Acute encephalopathy  -Secondary to community acquired pneumonia and hypoxia  -Improved, patient appears to be at his baseline   Mass noted on CT - MRI of the brain showing small 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 -Will consult NS  Bilateral lower extremity edema  -BNP 1367  -Echocardiogram pending   Normocytic anemia  -Anemia panel: Iron 11, ferritin 1107 (adequate stores) -Drop in Hb likely dilutional, will continue to monitor CBC -Continue supplementation   Homeless  -Social work consult   Tobacco Abuse -Smoking cessation counseling given -Nicotine patch  Deconditioning -PT and OT  consulted -OT recommended SNF -Pending PT evaluation  Code Status: Full   Family Communication: none at bedside   Disposition Plan: Will plan on discharge to SNF when medically stable  Time Spent in minutes 25 minutes   Procedures  None   Consults  none   DVT Prophylaxis Heparin  Lab Results  Component Value Date   PLT PLATELET CLUMPS NOTED ON SMEAR, UNABLE TO ESTIMATE 08/27/2014    Medications  Scheduled Meds: . feeding supplement (ENSURE COMPLETE)  237 mL Oral BID BM  . heparin  5,000 Units Subcutaneous 3  times per day  . multivitamins with iron  1 tablet Oral Daily  . nicotine  21 mg Transdermal Daily  . piperacillin-tazobactam (ZOSYN)  IV  3.375 g Intravenous 3 times per day  . polyethylene glycol  17 g Oral Daily  . vancomycin  1,000 mg Intravenous Q8H   Continuous Infusions:  PRN Meds:.acetaminophen, albuterol, oxycodone  Antibiotics    Anti-infectives   Start     Dose/Rate Route Frequency Ordered Stop   08/24/14 1800  azithromycin (ZITHROMAX) 500 mg in dextrose 5 % 250 mL IVPB  Status:  Discontinued     500 mg 250 mL/hr over 60 Minutes Intravenous Every 24 hours 08/24/14 1410 08/27/14 1418   08/24/14 1500  piperacillin-tazobactam (ZOSYN) IVPB 3.375 g     3.375 g 12.5 mL/hr over 240 Minutes Intravenous 3 times per day 08/24/14 1430     08/24/14 1430  vancomycin (VANCOCIN) IVPB 1000 mg/200 mL premix     1,000 mg 200 mL/hr over 60 Minutes Intravenous Every 8 hours 08/24/14 1429     08/24/14 0430  vancomycin (VANCOCIN) 1,500 mg in sodium chloride 0.9 % 500 mL IVPB     1,500 mg 250 mL/hr over 120 Minutes Intravenous  Once 08/24/14 0427 08/24/14 0952   08/24/14 0430  piperacillin-tazobactam (ZOSYN) IVPB 3.375 g     3.375 g 100 mL/hr over 30 Minutes Intravenous  Once 08/24/14 0427 08/24/14 0511   08/24/14 0430  levofloxacin (LEVAQUIN) IVPB 750 mg     750 mg 100 mL/hr over 90 Minutes Intravenous  Once 08/24/14 0427 08/24/14 0930        Subjective:   [name] seen and examined today.  Patient states he still feels able. He does not really remember yesterday. Patient currently has no complaints of chest pain, abdominal pain. He continues to cough.    Objective:   Filed Vitals:   08/26/14 1426 08/26/14 2153 08/27/14 0613 08/27/14 1505  BP: 122/62 136/70 133/82 120/64  Pulse: 82 91 87 83  Temp: 98.8 F (37.1 C) 98.4 F (36.9 C) 98.6 F (37 C) 97.9 F (36.6 C)  TempSrc: Oral Oral Oral Oral  Resp: _0 Height:      Weight:      SpO2: 94% 93% 95% 96%     Wt Readings from Last 3 Encounters:  08/25/14 71.7 kg (158 lb 1.1 oz)     Intake/Output Summary (Last 24 hours) at 08/27/14 1722 Last data filed at 08/27/14 1506  Gross per 24 hour  Intake    480 ml  Output   1600 ml  Net  -1120 ml    Exam  General: Well developed, well nourished, NAD, appears stated age  HEENT: NCAT,  mucous membranes moist. Small laceration on right eyebrow  Cardiovascular: S1 S2 auscultated, no rubs, murmurs or gallops. Regular rate and rhythm.  Respiratory: Rales, improving.  Slight expiratory wheezing.   Abdomen:  Soft, nontender, nondistended, + bowel sounds  Extremities: warm dry without cyanosis clubbing or edema  Neuro: AAOx3, no focal deficits  Data Review   Micro Results Recent Results (from the past 240 hour(s))  CULTURE, BLOOD (ROUTINE X 2)     Status: None   Collection Time    08/24/14  4:16 AM      Result Value Ref Range Status   Specimen Description BLOOD LEFT FOREARM   Final   Special Requests BOTTLES DRAWN AEROBIC AND ANAEROBIC 10CC EACH   Final   Culture  Setup Time     Final   Value: 08/24/2014 08:59     Performed at [facility]   Culture     Final   Value:        BLOOD CULTURE RECEIVED NO GROWTH TO DATE CULTURE WILL BE HELD FOR 5 DAYS BEFORE ISSUING A FINAL NEGATIVE REPORT     Performed at [facility]   Report Status PENDING   Incomplete  CULTURE, BLOOD (ROUTINE X 2)     Status: None   Collection Time    08/24/14  4:21 AM      Result Value Ref Range Status   Specimen Description BLOOD LEFT FOREARM   Final   Special Requests BOTTLES DRAWN AEROBIC AND ANAEROBIC 10CC EA   Final   Culture  Setup Time     Final   Value: 08/24/2014 08:57     Performed at [facility]   Culture     Final   Value:        BLOOD CULTURE RECEIVED NO GROWTH TO DATE CULTURE WILL BE HELD FOR 5 DAYS BEFORE ISSUING A FINAL NEGATIVE REPORT     Performed at [facility]   Report Status PENDING   Incomplete  URINE  CULTURE     Status: None   Collection Time    08/24/14  9:35 AM      Result Value Ref Range Status   Specimen Description URINE, CLEAN CATCH   Final   Special Requests NONE   Final   Culture  Setup Time     Final   Value: 08/24/2014 10:50     Performed at [facility]     Final   Value: NO GROWTH     Performed at [facility]   Culture     Final   Value: NO GROWTH     Performed at [facility]   Report Status 08/25/2014 FINAL   Final  RESPIRATORY VIRUS PANEL     Status: Abnormal   Collection Time    08/24/14  8:26 PM      Result Value Ref Range Status   Source - RVPAN NASAL SWAB   Corrected   Comment: CORRECTED ON 10/14 AT 2048: PREVIOUSLY REPORTED AS NASAL SWAB   Respiratory Syncytial Virus A NOT DETECTED   Final   Respiratory Syncytial Virus B NOT DETECTED   Final   Influenza A NOT DETECTED   Final   Influenza B NOT DETECTED   Final   Parainfluenza 1 NOT DETECTED   Final   Parainfluenza 2 NOT DETECTED   Final   Parainfluenza 3 NOT DETECTED   Final   Metapneumovirus NOT DETECTED   Final   Rhinovirus DETECTED (*)  Final   Adenovirus NOT DETECTED   Final   Influenza A H1 NOT DETECTED   Final   Influenza A H3 NOT DETECTED  Final   Comment: (NOTE)           Normal Reference Range for each Analyte: NOT DETECTED     Testing performed using the Luminex xTAG Respiratory Viral Panel test     kit.     The analytical performance characteristics of this assay have been     determined by [facility].  The modifications have not been     cleared or approved by the FDA. This assay has been validated pursuant     to the CLIA regulations and is used for clinical purposes.     Performed at [facility], EXPECTORATED SPUTUM-ASSESSMENT     Status: None   Collection Time    08/25/14  4:41 AM      Result Value Ref Range Status   Specimen Description SPUTUM   Final   Special Requests NONE   Final   Sputum evaluation      Final   Value: THIS SPECIMEN IS ACCEPTABLE. RESPIRATORY CULTURE REPORT TO FOLLOW.   Report Status 08/25/2014 FINAL   Final  CULTURE, RESPIRATORY (NON-EXPECTORATED)     Status: None   Collection Time    08/25/14  4:41 AM      Result Value Ref Range Status   Specimen Description SPUTUM   Final   Special Requests ADDED 0518   Final   Gram Stain     Final   Value: MODERATE WBC PRESENT,BOTH PMN AND MONONUCLEAR     RARE SQUAMOUS EPITHELIAL CELLS PRESENT     RARE GRAM POSITIVE COCCI     IN PAIRS RARE GRAM NEGATIVE RODS     Performed at [facility]   Culture     Final   Value: NORMAL OROPHARYNGEAL FLORA     Performed at [facility]   Report Status 08/27/2014 FINAL   Final  CULTURE, EXPECTORATED SPUTUM-ASSESSMENT     Status: None   Collection Time    08/26/14 10:48 PM      Result Value Ref Range Status   Specimen Description SPUTUM   Final   Special Requests NONE   Final   Sputum evaluation     Final   Value: MICROSCOPIC FINDINGS SUGGEST THAT THIS SPECIMEN IS NOT REPRESENTATIVE OF LOWER RESPIRATORY SECRETIONS. PLEASE RECOLLECT.   Report Status 08/27/2014 FINAL   Final  CULTURE, RESPIRATORY (NON-EXPECTORATED)     Status: None   Collection Time    08/26/14 10:48 PM      Result Value Ref Range Status   Specimen Description SPUTUM   Final   Special Requests NONE   Final   Gram Stain     Final   Value: FEW WBC PRESENT, PREDOMINANTLY PMN     NO SQUAMOUS EPITHELIAL CELLS SEEN     FEW YEAST     Performed at [facility]   Culture PENDING   Incomplete   Report Status PENDING   Incomplete    Radiology Reports Dg Chest 2 View  08/03/2014   CLINICAL DATA:  Shortness of breath, cough  EXAM: CHEST  2 VIEW  COMPARISON:  None.  FINDINGS: Chronic interstitial markings/emphysematous changes. No focal consolidation. No pleural effusion or pneumothorax.  The heart is normal in size.  Mild degenerative changes of the visualized thoracolumbar spine. Lower thoracolumbar space  spine fixation hardware.  IVC filter, incompletely visualized on the lateral view.  IMPRESSION: No evidence of acute cardiopulmonary disease.   Electronically Signed   By: Henderson Newcomer.D.  On: 08/03/2014 21:43   Ct Head Wo Contrast  08/24/2014   CLINICAL DATA:  Fall to floor with laceration.  Initial encounter.  EXAM: CT HEAD WITHOUT CONTRAST  TECHNIQUE: Contiguous axial images were obtained from the base of the skull through the vertex without intravenous contrast.  COMPARISON:  None.  FINDINGS: Skull and Sinuses:Negative for acute fracture.  There is diffuse inflammatory mucosal thickening in the paranasal sinuses, with mucosal thickening marked in the bilateral maxillary sinuses (where there are also effusions).  Orbits: Dysconjugate gaze which correlates with history of altered mental status.  Incisive canal cyst.  Brain: No evidence of acute abnormality, such as acute infarction, hemorrhage, hydrocephalus, or shift.  There is cerebral volume loss which is mildly advanced for age.  12 mm mass lesion near the left caudothalamic groove, extending into the frontal horn left lateral ventricle. There is coarse calcifications within the mass. No other periventricular nodularity suggestive of tuberous sclerosis. No other intracranial calcifications suggestive of remote granulomatous infection. The mass is near the foramen of Monro, but there is no ventricular obstruction.  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 The left putamen is also somewhat difficult to visualize, possibly related to the same.  IMPRESSION: 1. No acute intracranial injury. 2. 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 Recommend nonemergent brain MRI with contrast. 3. Diffuse sinusitis.   Electronically Signed   By: Jorje Guild M.D.   On: 08/24/2014 06:56   Dg Chest Port 1 View  08/24/2014   CLINICAL  DATA:  Fall.  Code sepsis.  Initial encounter.  EXAM: PORTABLE CHEST - 1 VIEW  COMPARISON:  08/03/2014  FINDINGS: There is extensive airspace disease throughout the right lung, especially in the lower lobe and middle lobe. There is mild coarsening of lung markings at the left base. Normal heart size and mediastinal contours for technique. No effusion, edema, or pneumothorax. Remote left-sided rib fractures; remote thoracolumbar posterior fixation. No acute osseous findings.  IMPRESSION: Multi lobar pneumonia on the right.   Electronically Signed   By: Jorje Guild M.D.   On: 08/24/2014 04:40    CBC  Recent Labs Lab 08/24/14 0412 08/25/14 0212 08/26/14 0536 08/27/14 0625  WBC 15.8* 11.3* 9.6 12.0*  HGB 10.3* 9.2* 9.9* 10.4*  HCT 30.7* 27.4* 30.1* 31.5*  PLT 144* 133* PLATELET CLUMPING, SUGGEST RECOLLECTION OF SAMPLE IN CITRATE TUBE. PLATELET CLUMPS NOTED ON SMEAR, UNABLE TO ESTIMATE  MCV 81.2 81.8 82.9 82.7  MCH 27.2 27.5 27.3 27.3  MCHC 33.6 33.6 32.9 33.0  RDW 14.5 14.6 14.7 14.7  LYMPHSABS 0.5* 0.5*  --   --   MONOABS 1.1* 0.6  --   --   EOSABS 0.0 0.0  --   --   BASOSABS 0.0 0.0  --   --     Chemistries   Recent Labs Lab 08/24/14 0412 08/25/14 0212 08/26/14 0536 08/27/14 0625  NA 131* 134* 136* 131*  K 3.6* 4.2 3.7 3.9  CL 93* 97 96 91*  CO2 _0 GLUCOSE 143* 171* 101* 111*  BUN _1 CREATININE 0.66 0.60 0.64 0.65  CALCIUM 8.2* 8.5 8.5 8.7  AST 39* 41*  --   --   ALT 40 40  --   --   ALKPHOS 200* 162*  --   --   BILITOT 2.0* 0.7  --   --    ------------------------------------------------------------------------------------------------------------------  estimated creatinine clearance is 106.8 ml/min (by C-G formula based on Cr of 0.65). ------------------------------------------------------------------------------------------------------------------ No results found for this basename: HGBA1C,  in the last 72  hours ------------------------------------------------------------------------------------------------------------------ No results found for this basename: CHOL, HDL, LDLCALC, TRIG, CHOLHDL, LDLDIRECT,  in the last 72 hours ------------------------------------------------------------------------------------------------------------------ No results found for this basename: TSH, T4TOTAL, FREET3, T3FREE, THYROIDAB,  in the last 72 hours ------------------------------------------------------------------------------------------------------------------ No results found for this basename: VITAMINB12, FOLATE, FERRITIN, TIBC, IRON, RETICCTPCT,  in the last 72 hours  Coagulation profile  Recent Labs Lab 08/24/14 1450  INR 1.27    No results found for this basename: DDIMER,  in the last 72 hours  Cardiac Enzymes  Recent Labs Lab 08/24/14 1450 08/24/14 1959 08/25/14 0212  TROPONINI <0.30 <0.30 <0.30   ------------------------------------------------------------------------------------------------------------------ No components found with this basename: POCBNP,     Daris Harkins D.O. on 08/27/2014 at 5:22 PM  Between 7am to 7pm - Pager - [phone]  After 7pm go to [email or web address] - password [email or web address]  And look for the night coverage person covering for me after hours  [facility] Office  [phone]

## 2014-08-27 NOTE — Progress Notes (Signed)
Occupational Therapy Treatment Patient Details Name: Ethan [name] MRN: [id number] DOB: 10-Oct-1961 Today's Date: 08/27/2014    History of present illness Ethan [name] is a 53 y.o. male with PMH of back pain, who presented with fever cough, chest pain and AMS. Upon admission, patient had altered mental status an did not answer questions consistently. The history was obtained both from patient himself and also from ED physician. Patient is homeless and is currently living in shelter home. About 5 days ago, he started coughing with yellow colored sputum. He denied chest pain and SOB, but his oxygen saturation decreased to 70% which responded to nonrebreather, and improved to above 90% in ED. He was febrile with temperature 102.2 in ED. He denied nausea, vomiting, abdominal pain or diarrhea. He had bilateral leg edema. CXR showed multifocal lobar pneumonia on the right. WBC 15.8. He was found to have elevated proBNP 1367, negative troponin. He was admitted to inpatient for further evaluation and treatment.     OT comments  Patient progressing towards OT goals. Desaturates on room air with minimal activity. Continue OT POC.  Follow Up Recommendations  SNF    Equipment Recommendations  None recommended by OT    Recommendations for Other Services      Precautions / Restrictions Precautions Precautions: Fall Precaution Comments: monitor 02 Restrictions Weight Bearing Restrictions: No       Mobility Bed Mobility                  Transfers Overall transfer level: Needs assistance Equipment used: 4-wheeled walker Transfers: Sit to/from Stand Sit to Stand: Min guard         General transfer comment: Min guard for safety and therapist stabilizing rollator as brakes are broken.    Balance                                   ADL Overall ADL's : Needs assistance/impaired Eating/Feeding: Set up;Sitting   Grooming: Sitting;Set up   Upper Body Bathing:  Minimal assitance;Sitting               Toilet Transfer: Stand-pivot;Min guard           Functional mobility during ADLs: Min guard;Cueing for safety;Rolling walker General ADL Comments: Patient agreeable to ambulate with Rollator to sink and perform ADL tasks sitting on Rollator seat. O2 on room air dropped to 76-80% with activity. Returned to bed and O2 via [place] applied. Patient tolerated tx well. Set pt up for eating breakfast sitting EOB at end of session.      Vision                     Perception     Praxis      Cognition   Behavior During Therapy: WFL for tasks assessed/performed Overall Cognitive Status: Within Functional Limits for tasks assessed                       Extremity/Trunk Assessment               Exercises     Shoulder Instructions       General Comments      Pertinent Vitals/ Pain       Pain Assessment: No/denies pain  Home Living  Prior Functioning/Environment              Frequency Min 2X/week     Progress Toward Goals  OT Goals(current goals can now be found in the care plan section)  Progress towards OT goals: Progressing toward goals     Plan Discharge plan remains appropriate    Co-evaluation                 End of Session Equipment Utilized During Treatment: Rolling walker   Activity Tolerance Patient tolerated treatment well   Patient Left in bed;with call bell/phone within reach   Nurse Communication Mobility status        Time: [id number] OT Time Calculation (min): 24 min  Charges: OT General Charges $OT Visit: 1 Procedure OT Treatments $Self Care/Home Management : 23-37 mins  Ethan [name] A 08/27/2014, 9:34 AM

## 2014-08-28 ENCOUNTER — Inpatient Hospital Stay (HOSPITAL_COMMUNITY): Payer: Medicaid Other

## 2014-08-28 LAB — BASIC METABOLIC PANEL
ANION GAP: 10 (ref 5–15)
BUN: 11 mg/dL (ref 6–23)
CALCIUM: 8.7 mg/dL (ref 8.4–10.5)
CHLORIDE: 91 meq/L — AB (ref 96–112)
CO2: 28 mEq/L (ref 19–32)
Creatinine, Ser: 0.65 mg/dL (ref 0.50–1.35)
GFR calc non Af Amer: >90 mL/min (ref >90)
Glucose, Bld: 108 mg/dL — ABNORMAL HIGH (ref 70–99)
Potassium: 4.2 mEq/L (ref 3.7–5.3)
Sodium: 129 mEq/L — ABNORMAL LOW (ref 137–147)

## 2014-08-28 LAB — CBC
HCT: 30.7 % — ABNORMAL LOW (ref 39.0–52.0)
Hemoglobin: 10.3 g/dL — ABNORMAL LOW (ref 13.0–17.0)
MCH: 28.1 pg (ref 26.0–34.0)
MCHC: 33.6 g/dL (ref 30.0–36.0)
MCV: 83.7 fL (ref 78.0–100.0)
PLATELETS: 131 10*3/uL — AB (ref 150–400)
RBC: 3.67 MIL/uL — ABNORMAL LOW (ref 4.22–5.81)
RDW: 14.7 % (ref 11.5–15.5)
WBC: 11.1 10*3/uL — AB (ref 4.0–10.5)

## 2014-08-28 MED ORDER — PNEUMOCOCCAL VAC POLYVALENT 25 MCG/0.5ML IJ INJ: 0.5000 mL | INJECTION | INTRAMUSCULAR | Status: DC

## 2014-08-28 MED ORDER — INFLUENZA VAC SPLIT QUAD 0.5 ML IM SUSY: 0.5000 mL | PREFILLED_SYRINGE | INTRAMUSCULAR | Status: DC

## 2014-08-28 MED ORDER — SODIUM CHLORIDE 0.9 % IV BOLUS (SEPSIS)
500.0000 mL | Freq: Once | INTRAVENOUS | Status: AC
  Administered 2014-08-28: 500 mL via INTRAVENOUS

## 2014-08-28 NOTE — Progress Notes (Signed)
NUTRITION FOLLOW UP  Intervention:   -Continue with current plan of care  Nutrition Dx:   Inadequate oral intake related to acute illness and varied appetite as evidenced by reported 9% weight loss in less than 2 months.   Goal:   Pt to meet >/= 90% of their estimated nutrition needs   Monitor:   PO intake, weight trend, labs  Assessment:   53 y.o. male with PMH of back pain, who presented with fever cough, chest pain and AMS. He was febrile with temperature 102.2 in ED. He had bilateral leg edema. CXR showed multifocal lobar pneumonia on the right.  Pt intake is variable, but improving (PO: 40-100%). Per MAR, continues to accept Ensure supplement well. Unable to assess wt changes, as no new wt since last visit.  Awaiting medical stability to discharge to SNF.  Labs reviewed. Na: 129, Cl: 91, Glucose: 108.   Height: Ht Readings from Last 1 Encounters:  08/24/14 5\' 9"  (1.753 m)    Weight Status:   Wt Readings from Last 1 Encounters:  08/25/14 158 lb 1.1 oz (71.7 kg)    Re-estimated needs:  Kcal: 1850-2100  Protein: 70-80 grams  Fluid: 1.9-2.1 L/day  Skin: ecchymosis on lt lower abdomen  Diet Order: General   Intake/Output Summary (Last 24 hours) at 08/28/14 0931 Last data filed at 08/28/14 0848  Gross per 24 hour  Intake    290 ml  Output   2725 ml  Net  -2435 ml    Last BM: 08/27/14   Labs:   Recent Labs Lab 08/26/14 0536 08/27/14 0625 08/28/14 0439  NA 136* 131* 129*  K 3.7 3.9 4.2  CL 96 91* 91*  CO2 29 27 28   BUN 8 8 11   CREATININE 0.64 0.65 0.65  CALCIUM 8.5 8.7 8.7  GLUCOSE 101* 111* 108*    CBG (last 3)  No results found for this basename: GLUCAP,  in the last 72 hours  Scheduled Meds: . feeding supplement (ENSURE COMPLETE)  237 mL Oral BID BM  . heparin  5,000 Units Subcutaneous 3 times per day  . multivitamins with iron  1 tablet Oral Daily  . nicotine  21 mg Transdermal Daily  . piperacillin-tazobactam (ZOSYN)  IV  3.375 g  Intravenous 3 times per day  . polyethylene glycol  17 g Oral Daily  . vancomycin  1,250 mg Intravenous 3 times per day    Continuous Infusions:   Rumaisa Schnetzer A. Mayford KnifeWilliams, RD, LDN Pager: [phone] After hours Pager: [phone]

## 2014-08-28 NOTE — Clinical Social Work Note (Signed)
Patient's [facility] number is in manual review. [facility].  Roddie McBryant Jere Vanburen MSW, [place]CSWA, [place], [phone]

## 2014-08-28 NOTE — Plan of Care (Signed)
Problem: Phase III Progression Outcomes Goal: IV/normal saline lock discontinued Outcome: Progressing Patient still receiving IV antibiotics

## 2014-08-28 NOTE — Clinical Social Work Note (Signed)
CSW called [facility]. They are requesting a 30 day note, signed FL2, and H&P for patient for their review.   Roddie McBryant Shirah Roseman MSW, [place]CS[place], [place], [phone]

## 2014-08-28 NOTE — Clinical Social Work Note (Signed)
Pasrr number received. [facility] is able to admit patient over the weekend if patient is ready for DC. Report left for weekend CSW.  Roddie McBryant Dominigue Gellner MSW, [place], [place], [phone]

## 2014-08-28 NOTE — Progress Notes (Signed)
Physical Therapy Treatment Patient Details Name: Ethan [name] MRN: [id number] DOB: Aug 27, 1961 Today's Date: [name]    History of Present Illness Ethan [name] is a 53 y.o. male with PMH of back pain, who presented with fever cough, chest pain and AMS. Upon admission, patient had altered mental status an did not answer questions consistently. The history was obtained both from patient himself and also from ED physician. Patient is homeless and is currently living in shelter home. About 5 days ago, he started coughing with yellow colored sputum. He denied chest pain and SOB, but his oxygen saturation decreased to 70% which responded to nonrebreather, and improved to above 90% in ED. He was febrile with temperature 102.2 in ED. He denied nausea, vomiting, abdominal pain or diarrhea. He had bilateral leg edema. CXR showed multifocal lobar pneumonia on the right. WBC 15.8. He was found to have elevated proBNP 1367, negative troponin. He was admitted to inpatient for further evaluation and treatment.      PT Comments    Patient progressing well with mobility and tolerated ambulation on 1 L 02 [place], which is weaned down from previous session. Pt with increased dyspnea today requiring longer seated rest break and drop in Sa02 to 84%. VC for pursed lip breathing to improve saturation. Will continue to follow and progress as tolerated.   Follow Up Recommendations  SNF;Other (comment)     Equipment Recommendations  Other (comment) (rollator.)    Recommendations for Other Services       Precautions / Restrictions Precautions Precautions: Fall Precaution Comments: monitor 02 Restrictions Weight Bearing Restrictions: No    Mobility  Bed Mobility Overal bed mobility: Needs Assistance Bed Mobility: Supine to Sit;Sit to Supine     Supine to sit: Supervision;HOB elevated Sit to supine: Supervision;HOB elevated   General bed mobility comments: Use of rails for  support.  Transfers Overall transfer level: Needs assistance Equipment used: 4-wheeled walker Transfers: Sit to/from Stand Sit to Stand: Min guard         General transfer comment: Min guard for safety with therapist stabilizing rollator as brakes are broken. Stood from Neurosurgeonrollator x2.  Ambulation/Gait Ambulation/Gait assistance: Min guard Ambulation Distance (Feet): 150 Feet (x2 bouts.) Assistive device: 4-wheeled walker Gait Pattern/deviations: Step-through pattern;Decreased stride length;Trunk flexed Gait velocity: decreased   General Gait Details: Severe forward flexed posture (due to back surgery). More steady today. Ambulated on 1L 02 [place]. 1 seated rest break btw bouts. VC for pursed lip breathing. Sa02 dropped to 84%.    Stairs            Wheelchair Mobility    Modified Rankin (Stroke Patients Only)       Balance Overall balance assessment: Needs assistance   Sitting balance-Leahy Scale: Good Sitting balance - Comments: Able to donn shoes sitting EOB reaching outside BOs and shifting weight without difficulty.    Standing balance support: During functional activity Standing balance-Leahy Scale: Poor Standing balance comment: Requires BUE support using rollator for balance/safety. Able to wash hands in standing reaching outside BoS to grab paper towel without LOB.                    Cognition Arousal/Alertness: Awake/alert Behavior During Therapy: WFL for tasks assessed/performed Overall Cognitive Status: Within Functional Limits for tasks assessed                      Exercises      General Comments General comments (skin integrity,  edema, etc.): Sa02 ranged from 84%-95% on 1L 02 [place].       Pertinent Vitals/Pain Pain Assessment: No/denies pain    Home Living                      Prior Function            PT Goals (current goals can now be found in the care plan section) Progress towards PT goals: Progressing toward  goals    Frequency  Min 3X/week    PT Plan Current plan remains appropriate    Co-evaluation             End of Session Equipment Utilized During Treatment: Gait belt;Oxygen Activity Tolerance: Patient tolerated treatment well;Other (comment) (Limited due to drop in Sa02.) Patient left: in bed;with call bell/phone within reach;with bed alarm set     Time: 1510-1538 PT Time Calculation (min): 28 min  Charges:  $Gait Training: 8-22 mins $Therapeutic Exercise: 8-22 mins                    G CodesAlvie [name]:      Ethan [name] Ethan HeidelbergShauna Folan, PT, DPT [phone]

## 2014-08-28 NOTE — Clinical Social Work Placement (Addendum)
Clinical Social Work Department CLINICAL SOCIAL WORK PLACEMENT NOTE 08/28/2014  Patient:  Ethan [name],Ethan [name]  Account Number:  [id number] Admit date:  08/24/2014  Clinical Social Worker:  Lavell LusterJOSEPH BRYANT Ethan [name], LCSWA  Date/time:  08/27/2014 08:42 AM  Clinical Social Work is seeking post-discharge placement for this patient at the following level of care:   SKILLED NURSING   (*CSW will update this form in Epic as items are completed)   08/27/2014  [facility]family provided with [facility] of Clinical [facility] Work's list of facilities offering this level of care within the geographic area requested by the patient (or if unable, by the patient's family).  08/27/2014  Patient/family informed of their freedom to choose among providers that offer the needed level of care, that participate in Medicare, Medicaid or managed care program needed by the patient, have an available bed and are willing to accept the patient.  08/27/2014  Patient/family informed of [facility]' ownership interest in [facility], as well as of the fact that they are under no obligation to receive care at this facility.  PASARR submitted to EDS on 08/27/2014 PASARR number received on 08/28/14  FL2 transmitted to all facilities in geographic area requested by pt/family on  08/28/2014 FL2 transmitted to all facilities within larger geographic area on   Patient informed that his/her managed care company has contracts with or will negotiate with  certain facilities, including the following:     Patient/family informed of bed offers received:  08/28/14 [facility] Physician recommends and patient chooses bed at    [facility]   Patient to be transferred to facility by Ambulance Patient and family notified of transfer on  Name of family member notified:    The following physician request were entered in Epic: Physician Request  Please  sign FL2.    Additional Comments:    [facility].   Roddie McBryant Makyna Niehoff MSW, [place] Bay[place]CSWA, [place], [phone]

## 2014-08-28 NOTE — Progress Notes (Signed)
[facility]                                                                              Patient Demographics  Ethan [name], is a 53 y.o. male, DOB - [birth year], KPV:[id number]  Admit date - 08/24/2014   Admitting Physician Ethan Costa, MD  Outpatient Primary MD for the patient is Ethan Palau, MD  LOS - 4   Chief Complaint  Patient presents with  . Fall      HPI on 08/24/2014  Ethan [name] is a 53 y.o. male with PMH of back pain, who presented with fever cough, chest pain and AMS. Upon admission, patient had altered mental status an did not answer questions consistently. The history was obtained both from patient himself and also from ED physician. Patient is homeless and is currently living in shelter home. About 5 days ago, he started coughing with yellow colored sputum. He denied chest pain and SOB, but his oxygen saturation decreased to 70% which responded to nonrebreather, and improved to above 90% in ED. He was febrile with temperature 102.2 in ED. He denied nausea, vomiting, abdominal pain or diarrhea. He had bilateral leg edema. CXR showed multifocal lobar pneumonia on the right. WBC 15.8. He was found to have elevated proBNP 1367, negative troponin. He was admitted to inpatient for further evaluation and treatment.   Interim History Admitted for sepsis secondary to CAP, improving.  Homeless, PT and OT consulted.  SW also consulted for placement.  Mass noted on CT, pending MRI.   Assessment & Plan  Severe sepsis secondary to community-acquired pneumonia  -Upon admission, patient was febrile, with leukocytosis as well as tachycardia and tachypnea.  -Chest x-ray: Multi lobar pneumonia on the right.  -Patient did have some altered mental status however seems to be back at baseline.  -Blood cultures obtained on 08/24/2014 showing no growth to date -Repeat chest x-ray showing interim partial clearing of diffuse right lung infiltrate however new prominent left  pulmonary infiltrate -Will continue IV antibiotic therapy with vancomycin and Zosyn, discontinued azithromycin on 08/28/2014 -He continues to require 3 L of supplemental oxygen -WiIll repeat CXR in AM.   Acute hypoxic respiratory failure  -Likely secondary to CAP -Oxygen saturation was 71% in the ER  -Showing clinical improvement, though still requiring 3 L supplemental oxygen  Hyponatremia -Patient having a downward trend in sodium, 129 on AM labs -Patient reports feeling weak, I am concerned that he may be dehydrated. 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 -Consult NS  Bilateral lower extremity edema  -BNP 1367  -Echocardiogram pending   Normocytic anemia  -Anemia panel: Iron 11, ferritin 1107 (adequate stores) -Drop in Hb likely dilutional, will  continue to monitor CBC -Continue supplementation   Homeless  -Social work consult   Tobacco Abuse -Smoking cessation counseling given -Nicotine patch  Deconditioning -PT and OT consulted -OT recommended SNF -Pending PT evaluation  Code Status: Full   Family Communication: none at bedside   Disposition Plan: Will plan on discharge to SNF when medically stable  Time Spent in minutes 30 minutes   Procedures  None   Consults  none   DVT Prophylaxis Heparin   Lab Results  Component Value Date   PLT 131* 08/28/2014    Medications  Scheduled Meds: . feeding  supplement (ENSURE COMPLETE)  237 mL Oral BID BM  . heparin  5,000 Units Subcutaneous 3 times per day  . multivitamins with iron  1 tablet Oral Daily  . nicotine  21 mg Transdermal Daily  . piperacillin-tazobactam (ZOSYN)  IV  3.375 g Intravenous 3 times per day  . polyethylene glycol  17 g Oral Daily  . vancomycin  1,250 mg Intravenous 3 times per day   Continuous Infusions:  PRN Meds:.acetaminophen, albuterol, oxycodone  Antibiotics    Anti-infectives   Start     Dose/Rate Route Frequency Ordered Stop   08/27/14 2330  vancomycin (VANCOCIN) 1,250 mg in sodium chloride 0.9 % 250 mL IVPB     1,250 mg 166.7 mL/hr over 90 Minutes Intravenous 3 times per day 08/27/14 2326     08/24/14 1800  azithromycin (ZITHROMAX) 500 mg in dextrose 5 % 250 mL IVPB  Status:  Discontinued     500 mg 250 mL/hr over 60 Minutes Intravenous Every 24 hours 08/24/14 1410 08/27/14 1418   08/24/14 1500  piperacillin-tazobactam (ZOSYN) IVPB 3.375 g     3.375 g 12.5 mL/hr over 240 Minutes Intravenous 3 times per day 08/24/14 1430     08/24/14 1430  vancomycin (VANCOCIN) IVPB 1000 mg/200 mL premix  Status:  Discontinued     1,000 mg 200 mL/hr over 60 Minutes Intravenous Every 8 hours 08/24/14 1429 08/27/14 2326   08/24/14 0430  vancomycin (VANCOCIN) 1,500 mg in sodium chloride 0.9 % 500 mL IVPB     1,500 mg 250 mL/hr over 120 Minutes Intravenous  Once 08/24/14 0427 08/24/14 0952   08/24/14 0430  piperacillin-tazobactam (ZOSYN) IVPB 3.375 g     3.375 g 100 mL/hr over 30 Minutes Intravenous  Once 08/24/14 0427 08/24/14 0511   08/24/14 0430  levofloxacin (LEVAQUIN) IVPB 750 mg     750 mg 100 mL/hr over 90 Minutes Intravenous  Once 08/24/14 0427 08/24/14 0930        Subjective:   Ethan [name] seen and examined today.  Patient states he still feels able. He does not really remember yesterday. Patient currently has no complaints of chest pain, abdominal pain. He continues to cough.    Objective:   Filed  Vitals:   08/27/14 2112 08/28/14 0507 08/28/14 1336 08/28/14 1505  BP: 132/70 135/70 126/72   Pulse: 90 90 87   Temp: 98 F (36.7 C) 98.6 F (37 C) 98.6 F (37 C)   TempSrc: Oral Oral Oral   Resp: $Remo'16 20 20   'wyyZE$ Height:      Weight:      SpO2: 98% 92% 96% 90%    Wt Readings from Last 3 Encounters:  08/25/14 71.7 kg (158 lb 1.1 oz)     Intake/Output Summary (Last 24 hours) at 08/28/14 1540 Last data filed at 08/28/14 1332  Gross per 24 hour  Intake    460  ml  Output   2625 ml  Net  -2165 ml    Exam  General: Well developed, well nourished, NAD, appears stated age  HEENT: NCAT,  mucous membranes moist. Small laceration on right eyebrow  Cardiovascular: S1 S2 auscultated, no rubs, murmurs or gallops. Regular rate and rhythm.  Respiratory: Rales, improving.  Slight expiratory wheezing.   Abdomen: Soft, nontender, nondistended, + bowel sounds  Extremities: warm dry without cyanosis clubbing or edema  Neuro: AAOx3, no focal deficits  Data Review   Micro Results Recent Results (from the past 240 hour(s))  CULTURE, BLOOD (ROUTINE X 2)     Status: None   Collection Time    08/24/14  4:16 AM      Result Value Ref Range Status   Specimen Description BLOOD LEFT FOREARM   Final   Special Requests BOTTLES DRAWN AEROBIC AND ANAEROBIC 10CC EACH   Final   Culture  Setup Time     Final   Value: 08/24/2014 08:59     Performed at [facility]   Culture     Final   Value:        BLOOD CULTURE RECEIVED NO GROWTH TO DATE CULTURE WILL BE HELD FOR 5 DAYS BEFORE ISSUING A FINAL NEGATIVE REPORT     Performed at [facility]   Report Status PENDING   Incomplete  CULTURE, BLOOD (ROUTINE X 2)     Status: None   Collection Time    08/24/14  4:21 AM      Result Value Ref Range Status   Specimen Description BLOOD LEFT FOREARM   Final   Special Requests BOTTLES DRAWN AEROBIC AND ANAEROBIC 10CC EA   Final   Culture  Setup Time     Final   Value: 08/24/2014 08:57      Performed at [facility]   Culture     Final   Value:        BLOOD CULTURE RECEIVED NO GROWTH TO DATE CULTURE WILL BE HELD FOR 5 DAYS BEFORE ISSUING A FINAL NEGATIVE REPORT     Performed at [facility]   Report Status PENDING   Incomplete  URINE CULTURE     Status: None   Collection Time    08/24/14  9:35 AM      Result Value Ref Range Status   Specimen Description URINE, CLEAN CATCH   Final   Special Requests NONE   Final   Culture  Setup Time     Final   Value: 08/24/2014 10:50     Performed at [facility] Count     Final   Value: NO GROWTH     Performed at [facility]   Culture     Final   Value: NO GROWTH     Performed at [facility]   Report Status 08/25/2014 FINAL   Final  RESPIRATORY VIRUS PANEL     Status: Abnormal   Collection Time    08/24/14  8:26 PM      Result Value Ref Range Status   Source - RVPAN NASAL SWAB   Corrected   Comment: CORRECTED ON 10/14 AT 2048: PREVIOUSLY REPORTED AS NASAL SWAB   Respiratory Syncytial Virus A NOT DETECTED   Final   Respiratory Syncytial Virus B NOT DETECTED   Final   Influenza A NOT DETECTED   Final   Influenza B NOT DETECTED   Final   Parainfluenza 1 NOT DETECTED  Final   Parainfluenza 2 NOT DETECTED   Final   Parainfluenza 3 NOT DETECTED   Final   Metapneumovirus NOT DETECTED   Final   Rhinovirus DETECTED (*)  Final   Adenovirus NOT DETECTED   Final   Influenza A H1 NOT DETECTED   Final   Influenza A H3 NOT DETECTED   Final   Comment: (NOTE)           Normal Reference Range for each Analyte: NOT DETECTED     Testing performed using the Luminex xTAG Respiratory Viral Panel test     kit.     The analytical performance characteristics of this assay have been     determined by [facility].  The modifications have not been     cleared or approved by the FDA. This assay has been validated pursuant     to the CLIA regulations and is used for clinical purposes.      Performed at [facility], EXPECTORATED SPUTUM-ASSESSMENT     Status: None   Collection Time    08/25/14  4:41 AM      Result Value Ref Range Status   Specimen Description SPUTUM   Final   Special Requests NONE   Final   Sputum evaluation     Final   Value: THIS SPECIMEN IS ACCEPTABLE. RESPIRATORY CULTURE REPORT TO FOLLOW.   Report Status 08/25/2014 FINAL   Final  CULTURE, RESPIRATORY (NON-EXPECTORATED)     Status: None   Collection Time    08/25/14  4:41 AM      Result Value Ref Range Status   Specimen Description SPUTUM   Final   Special Requests ADDED 0518   Final   Gram Stain     Final   Value: MODERATE WBC PRESENT,BOTH PMN AND MONONUCLEAR     RARE SQUAMOUS EPITHELIAL CELLS PRESENT     RARE GRAM POSITIVE COCCI     IN PAIRS RARE GRAM NEGATIVE RODS     Performed at [facility]   Culture     Final   Value: NORMAL OROPHARYNGEAL FLORA     Performed at [facility]   Report Status 08/27/2014 FINAL   Final  CULTURE, EXPECTORATED SPUTUM-ASSESSMENT     Status: None   Collection Time    08/26/14 10:48 PM      Result Value Ref Range Status   Specimen Description SPUTUM   Final   Special Requests NONE   Final   Sputum evaluation     Final   Value: MICROSCOPIC FINDINGS SUGGEST THAT THIS SPECIMEN IS NOT REPRESENTATIVE OF LOWER RESPIRATORY SECRETIONS. PLEASE RECOLLECT.   Report Status 08/27/2014 FINAL   Final  CULTURE, RESPIRATORY (NON-EXPECTORATED)     Status: None   Collection Time    08/26/14 10:48 PM      Result Value Ref Range Status   Specimen Description SPUTUM   Final   Special Requests NONE   Final   Gram Stain     Final   Value: FEW WBC PRESENT, PREDOMINANTLY PMN     NO SQUAMOUS EPITHELIAL CELLS SEEN     FEW YEAST     Performed at [facility]   Culture     Final   Value: NO GROWTH 1 DAY     Performed at [facility]   Report Status PENDING   Incomplete    Radiology Reports Dg Chest 2 View  08/03/2014   CLINICAL  DATA:  Shortness of breath, cough  EXAM: CHEST  2 VIEW  COMPARISON:  None.  FINDINGS: Chronic interstitial markings/emphysematous changes. No focal consolidation. No pleural effusion or pneumothorax.  The heart is normal in size.  Mild degenerative changes of the visualized thoracolumbar spine. Lower thoracolumbar space spine fixation hardware.  IVC filter, incompletely visualized on the lateral view.  IMPRESSION: No evidence of acute cardiopulmonary disease.   Electronically Signed   By: Julian Hy M.D.   On: 08/03/2014 21:43   Ct Head Wo Contrast  08/24/2014   CLINICAL DATA:  Fall to floor with laceration.  Initial encounter.  EXAM: CT HEAD WITHOUT CONTRAST  TECHNIQUE: Contiguous axial images were obtained from the base of the skull through the vertex without intravenous contrast.  COMPARISON:  None.  FINDINGS: Skull and Sinuses:Negative for acute fracture.  There is diffuse inflammatory mucosal thickening in the paranasal sinuses, with mucosal thickening marked in the bilateral maxillary sinuses (where there are also effusions).  Orbits: Dysconjugate gaze which correlates with history of altered mental status.  Incisive canal cyst.  Brain: No evidence of acute abnormality, such as acute infarction, hemorrhage, hydrocephalus, or shift.  There is cerebral volume loss which is mildly advanced for age.  12 mm mass lesion near the left caudothalamic groove, extending into the frontal horn left lateral ventricle. There is coarse calcifications within the mass. No other periventricular nodularity suggestive of tuberous sclerosis. No other intracranial calcifications suggestive of remote granulomatous infection. The mass is near the foramen of Monro, but there is no ventricular obstruction.  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 The left putamen is also somewhat difficult to visualize, possibly related to the same.   IMPRESSION: 1. No acute intracranial injury. 2. 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 Recommend nonemergent brain MRI with contrast. 3. Diffuse sinusitis.   Electronically Signed   By: Jorje Guild M.D.   On: 08/24/2014 06:56   Dg Chest Port 1 View  08/24/2014   CLINICAL DATA:  Fall.  Code sepsis.  Initial encounter.  EXAM: PORTABLE CHEST - 1 VIEW  COMPARISON:  08/03/2014  FINDINGS: There is extensive airspace disease throughout the right lung, especially in the lower lobe and middle lobe. There is mild coarsening of lung markings at the left base. Normal heart size and mediastinal contours for technique. No effusion, edema, or pneumothorax. Remote left-sided rib fractures; remote thoracolumbar posterior fixation. No acute osseous findings.  IMPRESSION: Multi lobar pneumonia on the right.   Electronically Signed   By: Jorje Guild M.D.   On: 08/24/2014 04:40    CBC  Recent Labs Lab 08/24/14 0412 08/25/14 0212 08/26/14 0536 08/27/14 0625 08/28/14 0439  WBC 15.8* 11.3* 9.6 12.0* 11.1*  HGB 10.3* 9.2* 9.9* 10.4* 10.3*  HCT 30.7* 27.4* 30.1* 31.5* 30.7*  PLT 144* 133* PLATELET CLUMPING, SUGGEST RECOLLECTION OF SAMPLE IN CITRATE TUBE. PLATELET CLUMPS NOTED ON SMEAR, UNABLE TO ESTIMATE 131*  MCV 81.2 81.8 82.9 82.7 83.7  MCH 27.2 27.5 27.3 27.3 28.1  MCHC 33.6 33.6 32.9 33.0 33.6  RDW 14.5 14.6 14.7 14.7 14.7  LYMPHSABS 0.5* 0.5*  --   --   --   MONOABS 1.1* 0.6  --   --   --   EOSABS 0.0 0.0  --   --   --   BASOSABS 0.0 0.0  --   --   --  Chemistries   Recent Labs Lab 08/24/14 0412 08/25/14 0212 08/26/14 0536 08/27/14 0625 08/28/14 0439  NA 131* 134* 136* 131* 129*  K 3.6* 4.2 3.7 3.9 4.2  CL 93* 97 96 91* 91*  CO2 $Re'26 28 29 27 28  'zwz$ GLUCOSE 143* 171* 101* 111* 108*  BUN $Re'11 10 8 8 11  'pXD$ CREATININE 0.66 0.60 0.64 0.65 0.65  CALCIUM 8.2* 8.5 8.5 8.7 8.7  AST 39* 41*  --   --   --   ALT 40 40  --   --   --   ALKPHOS 200* 162*  --    --   --   BILITOT 2.0* 0.7  --   --   --    ------------------------------------------------------------------------------------------------------------------ estimated creatinine clearance is 106.8 ml/min (by C-G formula based on Cr of 0.65). ------------------------------------------------------------------------------------------------------------------ No results found for this basename: HGBA1C,  in the last 72 hours ------------------------------------------------------------------------------------------------------------------ No results found for this basename: CHOL, HDL, LDLCALC, TRIG, CHOLHDL, LDLDIRECT,  in the last 72 hours ------------------------------------------------------------------------------------------------------------------ No results found for this basename: TSH, T4TOTAL, FREET3, T3FREE, THYROIDAB,  in the last 72 hours ------------------------------------------------------------------------------------------------------------------ No results found for this basename: VITAMINB12, FOLATE, FERRITIN, TIBC, IRON, RETICCTPCT,  in the last 72 hours  Coagulation profile  Recent Labs Lab 08/24/14 1450  INR 1.27    No results found for this basename: DDIMER,  in the last 72 hours  Cardiac Enzymes  Recent Labs Lab 08/24/14 1450 08/24/14 1959 08/25/14 0212  TROPONINI <0.30 <0.30 <0.30   ------------------------------------------------------------------------------------------------------------------ No components found with this basename: POCBNP,     Ezechiel Stooksbury D.O. on 08/28/2014 at 3:40 PM  Between 7am to 7pm - Pager - [phone]  After 7pm go to [email or web address] - password [email or web address]  And look for the night coverage person covering for me after hours  [facility] Office  [phone]

## 2014-08-29 DIAGNOSIS — G939 Disorder of brain, unspecified: Secondary | ICD-10-CM

## 2014-08-29 DIAGNOSIS — G9389 Other specified disorders of brain: Secondary | ICD-10-CM

## 2014-08-29 LAB — CBC
HCT: 27.7 % — ABNORMAL LOW (ref 39.0–52.0)
HEMOGLOBIN: 9.2 g/dL — AB (ref 13.0–17.0)
MCH: 27.2 pg (ref 26.0–34.0)
MCHC: 33.2 g/dL (ref 30.0–36.0)
MCV: 82 fL (ref 78.0–100.0)
PLATELETS: 157 10*3/uL (ref 150–400)
RBC: 3.38 MIL/uL — AB (ref 4.22–5.81)
RDW: 14.8 % (ref 11.5–15.5)
WBC: 6.3 10*3/uL (ref 4.0–10.5)

## 2014-08-29 LAB — CULTURE, RESPIRATORY

## 2014-08-29 LAB — BASIC METABOLIC PANEL
Anion gap: 10 (ref 5–15)
BUN: 11 mg/dL (ref 6–23)
CHLORIDE: 96 meq/L (ref 96–112)
CO2: 27 mEq/L (ref 19–32)
Calcium: 8 mg/dL — ABNORMAL LOW (ref 8.4–10.5)
Creatinine, Ser: 0.7 mg/dL (ref 0.50–1.35)
GFR calc non Af Amer: >90 mL/min (ref >90)
GLUCOSE: 98 mg/dL (ref 70–99)
POTASSIUM: 3.8 meq/L (ref 3.7–5.3)
SODIUM: 133 meq/L — AB (ref 137–147)

## 2014-08-29 LAB — CULTURE, RESPIRATORY W GRAM STAIN

## 2014-08-29 MED ORDER — AMOXICILLIN-POT CLAVULANATE 875-125 MG PO TABS: 1.0000 | ORAL_TABLET | Freq: Two times a day (BID) | ORAL | Status: DC

## 2014-08-29 NOTE — Discharge Summary (Signed)
Physician Discharge Summary  SHERRICK ARAKI ZOX:[id number] DOB: 08/28/1961 DOA: 08/24/2014  PCP: Elizabeth Palau, MD  Admit date: 08/24/2014 Discharge date: 08/29/2014  Time spent: 35 minutes  Recommendations for Outpatient Follow-up:  1. Brain mass seen on CT scan which was further worked up with MRI of brain. 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 Case was discussed with Dr Annette Stable of Neurosurgery who reviewed imaging. He recommended outpatient follow up at the office where he would likely take conservative approach with surveillance CT scans.  2. Please followup on BMP on hospital followup   Discharge Diagnoses:  Principal Problem:   Community acquired pneumonia Active Problems:   Sepsis   Bilateral leg edema   Acute encephalopathy   Anemia   Acute respiratory failure   Brain mass   Discharge Condition: Stable/improved  Diet recommendation: Regular diet  Filed Weights   08/24/14 1406 08/25/14 0605 08/29/14 0536  Weight: 70.67 kg (155 lb 12.8 oz) 71.7 kg (158 lb 1.1 oz) 63.9 kg (140 lb 14 oz)    History of present illness:  Ethan [name] is a 53 y.o. male with PMH of back pain, who presents with fever cough, chest pain and AMS.  Patient has altered mental status, he does not answer questions consistently. The history is obtained both from patient himself and also from ED physician. Patient is homeless and is currently living in shelter home. About 5 days ago, she started coughing with yellow colored sputum. He denies chest pain and SOB, but his his oxygen saturation decreased to 70% which responded to nonrebreather, and improved to above 90% in ED. He is obviously febrile with temperature 102.2 in ED. He seems not have nausea, vomiting, abdominal pain or diarrhea. He has bilateral leg edema.  CXR showed multifocal lobar pneumonia on the right. WBC 15.8. He was found to have elevated proBNP 1367, negative troponin. He is admitted to inpatient for  further evaluation and treatment.    Hospital Course:  Patient is a pleasant 53 year old gentleman with a past medical history of tobacco abuse, COPD, who was admitted to medicine service on 08/24/2014 presenting with cough, sputum production, fevers, chills, chest pain, and mental status changes. He was found to be hypoxemic having an O2 sat of 70% and required a nonrebreather initially. Initial chest x-ray showed multilobar pneumonia on right. He was admitted to medicine and started on broad-spectrum IV antibiotic therapy. CT scan was ordered to assess for mental status changes. This study revealed a 12 mm mass in the frontal horn left lateral ventricle and was further worked up with MRI of brain. 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 Case discussed with neurosurgery who recommended outpatient followup and surveillance CT. patient showed gradual clinical improvement and by 08/29/2014 he was weaned off of supplemental oxygen. Patient ambulated down the hallway maintaining his sats in the 90s.  Severe sepsis secondary to community-acquired pneumonia  -Upon admission, patient was febrile, with leukocytosis as well as tachycardia and tachypnea.  -Chest x-ray: Multi lobar pneumonia on the right.  -Patient did have some altered mental status however seems to be back at baseline.  -Blood cultures obtained on 08/24/2014 showing no growth to date  -Repeat chest x-ray on 08/28/2014 showing patchy bilateral diffuse airspace opacification without significant change overall. -By 08/29/2014 weaned off of supplemental oxygen, will discharge on Augmentin therapy Acute hypoxic respiratory failure  -Likely secondary to CAP  -Oxygen saturation was 71% in the ER  -Showing clinical  improvement, was weaned off of oxygen by 08/29/2014  Hyponatremia  -Patient had a sodium of 133 on 08/29/2014 Acute encephalopathy  -Secondary to community acquired pneumonia and hypoxia  -Improved,  patient appears to be at his baseline  Mass noted on CT  - MRI of the brain showing small 10 mm left frontal floor of intraventricular mass is inseparable from the left aspect of the of a cavum septum pellucidum, and was partially calcified on the comparison CT  -Radiology reporting differential considerations:central neurocytoma,  intraventricular meningioma, less likely subependymoma, and subependymal giant cell astrocytoma (no  stigmata of tuberous sclerosis identified).  -Case discussed with neurosurgery, recommended outpatient followup at the clinic where he will likely undergo surveillance CT scans Bilateral lower extremity edema  -BNP 1367  -Transthoracic echocardiogram showing evidence of pulmonary hypertension, EF 65% Homeless  -Social work consult, patient expressing wishes to be discharged to his homeless shelter. SNF placement had been offered which she turned down. Tobacco Abuse  -Smoking cessation counseling given  -Nicotine patch   Discharge Exam: Filed Vitals:   08/29/14 0536  BP: 112/67  Pulse: 80  Temp: 98.4 F (36.9 C)  Resp: 18    General: Well developed, well nourished, NAD, appears stated age  HEENT: NCAT, mucous membranes moist. Small laceration on right eyebrow  Cardiovascular: S1 S2 auscultated, no rubs, murmurs or gallops. Regular rate and rhythm.  Respiratory: Normal respiratory effort, improved lung exam with decreased crackles and rhonchi Abdomen: Soft, nontender, nondistended, + bowel sounds  Extremities: He has 1+ bilateral extremity pedal edema Neuro: AAOx3, no focal deficits   Discharge Instructions You were cared for by a hospitalist during your hospital stay. If you have any questions about your discharge medications or the care you received while you were in the hospital after you are discharged, you can call the unit and asked to speak with the hospitalist on call if the hospitalist that took care of you is not available. Once you are  discharged, your primary care physician will handle any further medical issues. Please note that NO REFILLS for any discharge medications will be authorized once you are discharged, as it is imperative that you return to your primary care physician (or establish a relationship with a primary care physician if you do not have one) for your aftercare needs so that they can reassess your need for medications and monitor your lab values.  Discharge Instructions   Call MD for:  difficulty breathing, headache or visual disturbances    Complete by:  As directed      Call MD for:  extreme fatigue    Complete by:  As directed      Call MD for:  hives    Complete by:  As directed      Call MD for:  persistant dizziness or light-headedness    Complete by:  As directed      Call MD for:  persistant nausea and vomiting    Complete by:  As directed      Call MD for:  redness, tenderness, or signs of infection (pain, swelling, redness, odor or green/yellow discharge around incision site)    Complete by:  As directed      Call MD for:  severe uncontrolled pain    Complete by:  As directed      Call MD for:  temperature >100.4    Complete by:  As directed      Diet - low sodium heart healthy    Complete  by:  As directed      Increase activity slowly    Complete by:  As directed           Current Discharge Medication List    START taking these medications   Details  amoxicillin-clavulanate (AUGMENTIN) 875-125 MG per tablet Take 1 tablet by mouth 2 (two) times daily. Qty: 8 tablet, Refills: 0      CONTINUE these medications which have NOT CHANGED   Details  LORazepam (ATIVAN) 0.5 MG tablet Take 0.5 mg by mouth 4 (four) times daily.    oxycodone (ROXICODONE) 30 MG immediate release tablet Take 30 mg by mouth every 4 (four) hours as needed for pain.       Allergies  Allergen Reactions  . Motrin [Ibuprofen] Nausea Only   Follow-up Information   Follow up with Elizabeth Palau, MD In 1  week.   Specialty:  Family Medicine   Contact information:   [address] [place]  [address] [phone]       Follow up with POOL,HENRY A, MD In 2 weeks.   Specialty:  Neurosurgery   Contact information:   [address] [place] [address] [phone]        The results of significant diagnostics from this hospitalization (including imaging, microbiology, ancillary and laboratory) are listed below for reference.    Significant Diagnostic Studies: Dg Chest 2 View  08/28/2014   CLINICAL DATA:  Cough, congestion and shortness of breath. Rule out pneumonia.  EXAM: CHEST  2 VIEW  COMPARISON:  08/27/2014  FINDINGS: Lungs are somewhat hypoinflated with persistent patchy bilateral airspace opacification likely infection. No evidence of effusion or pneumothorax. Cardiomediastinal silhouette and remainder the exam is unchanged.  IMPRESSION: Patchy bilateral diffuse airspace opacification likely infection without significant change overall.   Electronically Signed   By: Marin Olp M.D.   On: 08/28/2014 20:09   Dg Chest 2 View  08/27/2014   CLINICAL DATA:  Pneumonia.  Persistent cough.  EXAM: CHEST  2 VIEW  COMPARISON:  08/24/2014.  FINDINGS: Mediastinum and hilar structures are normal. Partial clearing of diffuse right lung infiltrate. New onset of diffuse infiltrate left lung. Heart size normal. Small right pleural effusion. No pneumothorax. No acute bony abnormality. Prior thoracolumbar spine fusion.  IMPRESSION: 1. Interim partial clearing of diffuse right lung infiltrate. 2. New prominent left pulmonary infiltrate. These findings are consistent with pneumonia. Pulmonary edema could also present in this fashion. No cardiomegaly or pulmonary venous congestion noted. 3. Small right pleural effusion.   Electronically Signed   By: Marcello Moores  Register   On: 08/27/2014 11:39   Dg Chest 2 View  08/03/2014   CLINICAL DATA:  Shortness of breath, cough  EXAM: CHEST   2 VIEW  COMPARISON:  None.  FINDINGS: Chronic interstitial markings/emphysematous changes. No focal consolidation. No pleural effusion or pneumothorax.  The heart is normal in size.  Mild degenerative changes of the visualized thoracolumbar spine. Lower thoracolumbar space spine fixation hardware.  IVC filter, incompletely visualized on the lateral view.  IMPRESSION: No evidence of acute cardiopulmonary disease.   Electronically Signed   By: Julian Hy M.D.   On: 08/03/2014 21:43   Ct Head Wo Contrast  08/24/2014   CLINICAL DATA:  Fall to floor with laceration.  Initial encounter.  EXAM: CT HEAD WITHOUT CONTRAST  TECHNIQUE: Contiguous axial images were obtained from the base of the skull through the vertex without intravenous contrast.  COMPARISON:  None.  FINDINGS: Skull and Sinuses:Negative for acute fracture.  There is diffuse inflammatory mucosal thickening in the paranasal sinuses, with mucosal thickening marked in the bilateral maxillary sinuses (where there are also effusions).  Orbits: Dysconjugate gaze which correlates with history of altered mental status.  Incisive canal cyst.  Brain: No evidence of acute abnormality, such as acute infarction, hemorrhage, hydrocephalus, or shift.  There is cerebral volume loss which is mildly advanced for age.  12 mm mass lesion near the left caudothalamic groove, extending into the frontal horn left lateral ventricle. There is coarse calcifications within the mass. No other periventricular nodularity suggestive of tuberous sclerosis. No other intracranial calcifications suggestive of remote granulomatous infection. The mass is near the foramen of Monro, but there is no ventricular obstruction.  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 The left putamen is also somewhat difficult to visualize, possibly related to the same.  IMPRESSION: 1. No acute intracranial injury.  2. 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 Recommend nonemergent brain MRI with contrast. 3. Diffuse sinusitis.   Electronically Signed   By: Jorje Guild M.D.   On: 08/24/2014 06:56   Mr Jeri Cos MO Contrast  08/27/2014   CLINICAL DATA:  53 year old male with acute altered mental status, fever, cough, chest pain. Initial encounter. Intraventricular mass on noncontrast CT.  EXAM: MRI HEAD WITHOUT AND WITH CONTRAST  TECHNIQUE: Multiplanar, multiecho pulse sequences of the brain and surrounding structures were obtained without and with intravenous contrast.  CONTRAST:  34mL MULTIHANCE GADOBENATE DIMEGLUMINE 529 MG/ML IV SOLN  COMPARISON:  Non contrast head CT 08/24/2014.  FINDINGS: Small T2 heterogeneous round 9 -10 mm diameter left frontal horn intraventricular mass has fairly homogeneous precontrast T1 signal and homogeneously enhances following contrast. There is a partial cavum septum pellucidum anteriorly common and the mass is inseparable from the left septum. No hydrocephalus. Temporal horns are diminutive. The lesion was partially calcified on the comparison. No other intraventricular mass. No subependymal nodules.  No other abnormal intracranial enhancement. No restricted diffusion to suggest acute infarction. No midline shift, mass effect, extra-axial collection or acute intracranial hemorrhage. Cervicomedullary junction and pituitary are within normal limits. Negative visualized cervical spine. Pearline Cables and white matter signal is within normal limits for age throughout the brain. Major intracranial vascular flow voids are preserved, dominant distal left vertebral artery.  Visible internal auditory structures appear normal. Trace mastoid fluid. Small volume retained secretions in the nasopharynx. Moderate paranasal sinus inflammatory changes. Visualized orbit soft tissues are within normal limits. Visualized scalp soft tissues are within normal limits. Heterogeneous  bone marrow signal is nonspecific. No destructive osseous lesion identified.  IMPRESSION: 1. Small 10 mm left frontal horn intraventricular mass enhances, is inseparable from the left aspect of a cavum septum pellucidum, and was partially calcified on the comparison CT. Top differential considerations include: central neurocytoma, intraventricular meningioma, less likely subependymoma (generally do not enhancement), and subependymal giant cell astrocytoma (no stigmata of tuberous sclerosis identified). 2. Otherwise negative for age MRI appearance of the brain. 3. Paranasal sinus inflammation.   Electronically Signed   By: Lars Pinks M.D.   On: 08/27/2014 11:57   Dg Chest Port 1 View  08/24/2014   CLINICAL DATA:  Fall.  Code sepsis.  Initial encounter.  EXAM: PORTABLE CHEST - 1 VIEW  COMPARISON:  08/03/2014  FINDINGS: There is extensive airspace disease throughout the right lung,  especially in the lower lobe and middle lobe. There is mild coarsening of lung markings at the left base. Normal heart size and mediastinal contours for technique. No effusion, edema, or pneumothorax. Remote left-sided rib fractures; remote thoracolumbar posterior fixation. No acute osseous findings.  IMPRESSION: Multi lobar pneumonia on the right.   Electronically Signed   By: Jorje Guild M.D.   On: 08/24/2014 04:40    Microbiology: Recent Results (from the past 240 hour(s))  CULTURE, BLOOD (ROUTINE X 2)     Status: None   Collection Time    08/24/14  4:16 AM      Result Value Ref Range Status   Specimen Description BLOOD LEFT FOREARM   Final   Special Requests BOTTLES DRAWN AEROBIC AND ANAEROBIC 10CC EACH   Final   Culture  Setup Time     Final   Value: 08/24/2014 08:59     Performed at [facility]   Culture     Final   Value:        BLOOD CULTURE RECEIVED NO GROWTH TO DATE CULTURE WILL BE HELD FOR 5 DAYS BEFORE ISSUING A FINAL NEGATIVE REPORT     Performed at [facility]   Report Status PENDING    Incomplete  CULTURE, BLOOD (ROUTINE X 2)     Status: None   Collection Time    08/24/14  4:21 AM      Result Value Ref Range Status   Specimen Description BLOOD LEFT FOREARM   Final   Special Requests BOTTLES DRAWN AEROBIC AND ANAEROBIC 10CC EA   Final   Culture  Setup Time     Final   Value: 08/24/2014 08:57     Performed at [facility]   Culture     Final   Value:        BLOOD CULTURE RECEIVED NO GROWTH TO DATE CULTURE WILL BE HELD FOR 5 DAYS BEFORE ISSUING A FINAL NEGATIVE REPORT     Performed at [facility]   Report Status PENDING   Incomplete  URINE CULTURE     Status: None   Collection Time    08/24/14  9:35 AM      Result Value Ref Range Status   Specimen Description URINE, CLEAN CATCH   Final   Special Requests NONE   Final   Culture  Setup Time     Final   Value: 08/24/2014 10:50     Performed at [facility]     Final   Value: NO GROWTH     Performed at [facility]   Culture     Final   Value: NO GROWTH     Performed at [facility]   Report Status 08/25/2014 FINAL   Final  RESPIRATORY VIRUS PANEL     Status: Abnormal   Collection Time    08/24/14  8:26 PM      Result Value Ref Range Status   Source - RVPAN NASAL SWAB   Corrected   Comment: CORRECTED ON 10/14 AT 2048: PREVIOUSLY REPORTED AS NASAL SWAB   Respiratory Syncytial Virus A NOT DETECTED   Final   Respiratory Syncytial Virus B NOT DETECTED   Final   Influenza A NOT DETECTED   Final   Influenza B NOT DETECTED   Final   Parainfluenza 1 NOT DETECTED   Final   Parainfluenza 2 NOT DETECTED   Final   Parainfluenza 3 NOT DETECTED  Final   Metapneumovirus NOT DETECTED   Final   Rhinovirus DETECTED (*)  Final   Adenovirus NOT DETECTED   Final   Influenza A H1 NOT DETECTED   Final   Influenza A H3 NOT DETECTED   Final   Comment: (NOTE)           Normal Reference Range for each Analyte: NOT DETECTED     Testing performed using the Luminex xTAG  Respiratory Viral Panel test     kit.     The analytical performance characteristics of this assay have been     determined by [facility].  The modifications have not been     cleared or approved by the FDA. This assay has been validated pursuant     to the CLIA regulations and is used for clinical purposes.     Performed at [facility], EXPECTORATED SPUTUM-ASSESSMENT     Status: None   Collection Time    08/25/14  4:41 AM      Result Value Ref Range Status   Specimen Description SPUTUM   Final   Special Requests NONE   Final   Sputum evaluation     Final   Value: THIS SPECIMEN IS ACCEPTABLE. RESPIRATORY CULTURE REPORT TO FOLLOW.   Report Status 08/25/2014 FINAL   Final  CULTURE, RESPIRATORY (NON-EXPECTORATED)     Status: None   Collection Time    08/25/14  4:41 AM      Result Value Ref Range Status   Specimen Description SPUTUM   Final   Special Requests ADDED 0518   Final   Gram Stain     Final   Value: MODERATE WBC PRESENT,BOTH PMN AND MONONUCLEAR     RARE SQUAMOUS EPITHELIAL CELLS PRESENT     RARE GRAM POSITIVE COCCI     IN PAIRS RARE GRAM NEGATIVE RODS     Performed at [facility]   Culture     Final   Value: NORMAL OROPHARYNGEAL FLORA     Performed at [facility]   Report Status 08/27/2014 FINAL   Final  CULTURE, EXPECTORATED SPUTUM-ASSESSMENT     Status: None   Collection Time    08/26/14 10:48 PM      Result Value Ref Range Status   Specimen Description SPUTUM   Final   Special Requests NONE   Final   Sputum evaluation     Final   Value: MICROSCOPIC FINDINGS SUGGEST THAT THIS SPECIMEN IS NOT REPRESENTATIVE OF LOWER RESPIRATORY SECRETIONS. PLEASE RECOLLECT.   Report Status 08/27/2014 FINAL   Final  CULTURE, RESPIRATORY (NON-EXPECTORATED)     Status: None   Collection Time    08/26/14 10:48 PM      Result Value Ref Range Status   Specimen Description SPUTUM   Final   Special Requests NONE   Final   Gram Stain     Final    Value: FEW WBC PRESENT, PREDOMINANTLY PMN     NO SQUAMOUS EPITHELIAL CELLS SEEN     FEW YEAST     Performed at [facility]   Culture     Final   Value: FEW YEAST CONSISTENT WITH CANDIDA SPECIES     Performed at [facility]   Report Status 08/29/2014 FINAL   Final     Labs: Basic Metabolic Panel:  Recent Labs Lab 08/25/14 0212 08/26/14 0536 08/27/14 0625 08/28/14 0439 08/29/14 0453  NA 134* 136* 131* 129* 133*  K  4.2 3.7 3.9 4.2 3.8  CL 97 96 91* 91* 96  CO2 $Re'28 29 27 28 27  'buY$ GLUCOSE 171* 101* 111* 108* 98  BUN $Re'10 8 8 11 11  'aXk$ CREATININE 0.60 0.64 0.65 0.65 0.70  CALCIUM 8.5 8.5 8.7 8.7 8.0*   Liver Function Tests:  Recent Labs Lab 08/24/14 0412 08/25/14 0212  AST 39* 41*  ALT 40 40  ALKPHOS 200* 162*  BILITOT 2.0* 0.7  PROT 6.7 6.5  ALBUMIN 2.3* 2.1*    Recent Labs Lab 08/24/14 0412  LIPASE 12   No results found for this basename: AMMONIA,  in the last 168 hours CBC:  Recent Labs Lab 08/24/14 0412 08/25/14 0212 08/26/14 0536 08/27/14 0625 08/28/14 0439 08/29/14 0453  WBC 15.8* 11.3* 9.6 12.0* 11.1* 6.3  NEUTROABS 14.2* 10.1*  --   --   --   --   HGB 10.3* 9.2* 9.9* 10.4* 10.3* 9.2*  HCT 30.7* 27.4* 30.1* 31.5* 30.7* 27.7*  MCV 81.2 81.8 82.9 82.7 83.7 82.0  PLT 144* 133* PLATELET CLUMPING, SUGGEST RECOLLECTION OF SAMPLE IN CITRATE TUBE. PLATELET CLUMPS NOTED ON SMEAR, UNABLE TO ESTIMATE 131* 157   Cardiac Enzymes:  Recent Labs Lab 08/24/14 1450 08/24/14 1959 08/25/14 0212  TROPONINI <0.30 <0.30 <0.30   BNP: BNP (last 3 results)  Recent Labs  08/24/14 0416  PROBNP 1367.0*   CBG: No results found for this basename: GLUCAP,  in the last 168 hours     Signed:  Kelvin Cellar  [facility] 08/29/2014, 11:27 AM

## 2014-08-29 NOTE — Progress Notes (Signed)
CSW Intern spoke with pt about d/c plan. Pt stated that [facility]ittance, but his bed was given away. Pt stated he would like to go back to [facility] or is willing to go [place] or [place]. Pt stated he has transportation available through his friend. Pt also stated he may be able to stay with a friend as a last resort. CSW Intern called [facility] and spoke with Barbara CowerJason who stated he had just given away the last bed and nothing else is available. CSW Intern [facility] [place] and no one answered the phone. CSW Intern called [facility] in [place] and no one answered.  CSW Intern informed pt of this information and stated CSW Intern will keep trying to call [facility] and [facility]. Pt asked about his walker and stated someone was supposed to speak with him about getting another one. CSW Intern stated she would check with nurse. CSW Intern spoke with Nurse Raoul PitchSierra about walker and she stated she is waiting on a call back from CM. Pt informed CSW Intern that he was just going to leave and not to worry about finding a shelter. CSW Intern informed Nurse Raoul PitchSierra.   CSW Intern Tonette BihariJeneya  Cosigned by Chelsea Ausj Suzann Lazaro

## 2014-08-29 NOTE — Progress Notes (Signed)
Ethan [name] to be D/C'd Home per MD order.  Discussed with the patient and all questions fully answered.    Medication List         amoxicillin-clavulanate 875-125 MG per tablet  Commonly known as:  AUGMENTIN  Take 1 tablet by mouth 2 (two) times daily.     LORazepam 0.5 MG tablet  Commonly known as:  ATIVAN  Take 0.5 mg by mouth 4 (four) times daily.     oxycodone 30 MG immediate release tablet  Commonly known as:  ROXICODONE  Take 30 mg by mouth every 4 (four) hours as needed for pain.        VVS, Skin clean, dry and intact without evidence of skin break down, no evidence of skin tears noted. IV catheter discontinued intact. Site without signs and symptoms of complications. Dressing and pressure applied.  An After Visit Summary was printed and given to the patient.  D/c education completed with patient/family including follow up instructions, medication list, d/c activities limitations if indicated, with other d/c instructions as indicated by MD - patient able to verbalize understanding, all questions fully answered.   Patient instructed to return to ED, call 911, or call MD for any changes in condition.   Patient escorted via WC, and D/C home via private auto.  Diron Haddon D 08/29/2014 12:03 PM

## 2014-08-30 LAB — CULTURE, BLOOD (ROUTINE X 2)
Culture: NO GROWTH
Culture: NO GROWTH

## 2014-12-15 ENCOUNTER — Encounter (HOSPITAL_COMMUNITY): Payer: Self-pay

## 2014-12-15 ENCOUNTER — Emergency Department (HOSPITAL_COMMUNITY): Payer: Medicaid Other

## 2014-12-15 ENCOUNTER — Emergency Department (HOSPITAL_COMMUNITY)
Admission: EM | Admit: 2014-12-15 | Discharge: 2014-12-15 | Disposition: A | Discharge status: Home / Self Care | Payer: Medicaid Other | Attending: Emergency Medicine | Admitting: Emergency Medicine

## 2014-12-15 DIAGNOSIS — Z8739 Personal history of other diseases of the musculoskeletal system and connective tissue: Secondary | ICD-10-CM | POA: Insufficient documentation

## 2014-12-15 DIAGNOSIS — I10 Essential (primary) hypertension: Secondary | ICD-10-CM | POA: Diagnosis not present

## 2014-12-15 DIAGNOSIS — Z79899 Other long term (current) drug therapy: Secondary | ICD-10-CM | POA: Insufficient documentation

## 2014-12-15 DIAGNOSIS — Z720 Tobacco use: Secondary | ICD-10-CM | POA: Diagnosis not present

## 2014-12-15 DIAGNOSIS — R400 Somnolence: Secondary | ICD-10-CM | POA: Diagnosis not present

## 2014-12-15 DIAGNOSIS — Z8701 Personal history of pneumonia (recurrent): Secondary | ICD-10-CM | POA: Diagnosis not present

## 2014-12-15 DIAGNOSIS — M545 Low back pain: Secondary | ICD-10-CM | POA: Diagnosis present

## 2014-12-15 DIAGNOSIS — G8929 Other chronic pain: Secondary | ICD-10-CM | POA: Insufficient documentation

## 2014-12-15 DIAGNOSIS — R0902 Hypoxemia: Secondary | ICD-10-CM

## 2014-12-15 DIAGNOSIS — Z792 Long term (current) use of antibiotics: Secondary | ICD-10-CM | POA: Insufficient documentation

## 2014-12-15 LAB — COMPREHENSIVE METABOLIC PANEL
ALT: 10 U/L (ref 0–53)
ANION GAP: 6 (ref 5–15)
AST: 17 U/L (ref 0–37)
Albumin: 3.6 g/dL (ref 3.5–5.2)
Alkaline Phosphatase: 94 U/L (ref 39–117)
BILIRUBIN TOTAL: 1.4 mg/dL — AB (ref 0.3–1.2)
BUN: 18 mg/dL (ref 6–23)
CO2: 28 mmol/L (ref 19–32)
Calcium: 8.6 mg/dL (ref 8.4–10.5)
Chloride: 99 mmol/L (ref 96–112)
Creatinine, Ser: 0.74 mg/dL (ref 0.50–1.35)
GFR calc Af Amer: >90 mL/min (ref >90)
GLUCOSE: 96 mg/dL (ref 70–99)
Potassium: 3.5 mmol/L (ref 3.5–5.1)
SODIUM: 133 mmol/L — AB (ref 135–145)
Total Protein: 7.1 g/dL (ref 6.0–8.3)

## 2014-12-15 LAB — RAPID URINE DRUG SCREEN, HOSP PERFORMED
Amphetamines: NOT DETECTED
BARBITURATES: NOT DETECTED
Benzodiazepines: POSITIVE — AB
COCAINE: NOT DETECTED
Opiates: POSITIVE — AB
Tetrahydrocannabinol: NOT DETECTED

## 2014-12-15 LAB — CBC
HCT: 35.1 % — ABNORMAL LOW (ref 39.0–52.0)
Hemoglobin: 11.7 g/dL — ABNORMAL LOW (ref 13.0–17.0)
MCH: 28.5 pg (ref 26.0–34.0)
MCHC: 33.3 g/dL (ref 30.0–36.0)
MCV: 85.4 fL (ref 78.0–100.0)
Platelets: 181 10*3/uL (ref 150–400)
RBC: 4.11 MIL/uL — AB (ref 4.22–5.81)
RDW: 14.3 % (ref 11.5–15.5)
WBC: 8.1 10*3/uL (ref 4.0–10.5)

## 2014-12-15 LAB — ACETAMINOPHEN LEVEL
ACETAMINOPHEN (TYLENOL), SERUM: 13.5 ug/mL (ref 10–30)
Acetaminophen (Tylenol), Serum: 38.9 ug/mL — ABNORMAL HIGH (ref 10–30)

## 2014-12-15 LAB — ETHANOL: Alcohol, Ethyl (B): <5 mg/dL (ref 0–9)

## 2014-12-15 LAB — SALICYLATE LEVEL: Salicylate Lvl: <4 mg/dL (ref 2.8–20.0)

## 2014-12-15 LAB — BRAIN NATRIURETIC PEPTIDE: B Natriuretic Peptide: 245.1 pg/mL — ABNORMAL HIGH (ref 0.0–100.0)

## 2014-12-15 NOTE — ED Provider Notes (Signed)
Patient is well-appearing at this time. Patient's repeat Tylenol level is 13, doubt acute tylenol poisoning, especially with no onset. Normal LFTs. Patient is awake and alert, has no hypoxia. Possible edema seen on chest x-ray but BNP is equivocal. His sats remained above 90% while in room air. He is eating and drinking, requesting more food. At this point he has been reasonably screen and is stable for discharge as there appears to be no acute emergent medical condition.  Audree CamelScott T Isaias Dowson, MD 12/15/14 [phone]

## 2014-12-15 NOTE — ED Notes (Signed)
charted in error, urine was collected with an in and out
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
## 2014-12-15 NOTE — ED Notes (Signed)
Pt unable to void at this time. 

## 2014-12-15 NOTE — ED Notes (Addendum)
Pt has chronic back pain. Rod and screws in back. Pain increased last couple of days.  No change in urination.  No fever. Pt very lethargic on assessment.  Unable to keep eyes open. States he is tired.  Denies meds or alcohol.
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
## 2014-12-15 NOTE — ED Notes (Signed)
Rectal Temp = T97.8
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
## 2015-01-12 ENCOUNTER — Encounter (HOSPITAL_COMMUNITY): Payer: Self-pay | Admitting: *Deleted

## 2015-01-12 ENCOUNTER — Emergency Department (HOSPITAL_COMMUNITY)
Admission: EM | Admit: 2015-01-12 | Discharge: 2015-01-13 | Disposition: A | Discharge status: Home / Self Care | Payer: Medicaid Other | Attending: Emergency Medicine | Admitting: Emergency Medicine

## 2015-01-12 ENCOUNTER — Emergency Department (HOSPITAL_COMMUNITY): Payer: Medicaid Other

## 2015-01-12 DIAGNOSIS — S70311A Abrasion, right thigh, initial encounter: Secondary | ICD-10-CM | POA: Diagnosis not present

## 2015-01-12 DIAGNOSIS — Y9289 Other specified places as the place of occurrence of the external cause: Secondary | ICD-10-CM | POA: Insufficient documentation

## 2015-01-12 DIAGNOSIS — S8991XA Unspecified injury of right lower leg, initial encounter: Secondary | ICD-10-CM | POA: Diagnosis not present

## 2015-01-12 DIAGNOSIS — Z79899 Other long term (current) drug therapy: Secondary | ICD-10-CM | POA: Insufficient documentation

## 2015-01-12 DIAGNOSIS — Z8701 Personal history of pneumonia (recurrent): Secondary | ICD-10-CM | POA: Diagnosis not present

## 2015-01-12 DIAGNOSIS — Z792 Long term (current) use of antibiotics: Secondary | ICD-10-CM | POA: Insufficient documentation

## 2015-01-12 DIAGNOSIS — Y998 Other external cause status: Secondary | ICD-10-CM | POA: Insufficient documentation

## 2015-01-12 DIAGNOSIS — S79921A Unspecified injury of right thigh, initial encounter: Secondary | ICD-10-CM | POA: Diagnosis present

## 2015-01-12 DIAGNOSIS — Z8739 Personal history of other diseases of the musculoskeletal system and connective tissue: Secondary | ICD-10-CM | POA: Insufficient documentation

## 2015-01-12 DIAGNOSIS — Y9389 Activity, other specified: Secondary | ICD-10-CM | POA: Diagnosis not present

## 2015-01-12 DIAGNOSIS — I10 Essential (primary) hypertension: Secondary | ICD-10-CM | POA: Insufficient documentation

## 2015-01-12 DIAGNOSIS — G8929 Other chronic pain: Secondary | ICD-10-CM | POA: Insufficient documentation

## 2015-01-12 DIAGNOSIS — Z720 Tobacco use: Secondary | ICD-10-CM | POA: Diagnosis not present

## 2015-01-12 DIAGNOSIS — M79604 Pain in right leg: Secondary | ICD-10-CM

## 2015-01-12 DIAGNOSIS — T148XXA Other injury of unspecified body region, initial encounter: Secondary | ICD-10-CM

## 2015-01-12 HISTORY — DX: Polyneuropathy, unspecified: G62.9
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
## 2015-01-12 NOTE — Discharge Instructions (Signed)
Assault, General °Assault includes any behavior, whether intentional or reckless, which results in bodily injury to another person and/or damage to property. Included in this would be any behavior, intentional or reckless, that by its nature would be understood (interpreted) by a reasonable person as intent to harm another person or to damage his/her property. Threats may be oral or written. They may be communicated through regular mail, computer, fax, or phone. These threats may be direct or implied. °FORMS OF ASSAULT INCLUDE: °· Physically assaulting a person. This includes physical threats to inflict physical harm as well as: °¨ Slapping. °¨ Hitting. °¨ Poking. °¨ Kicking. °¨ Punching. °¨ Pushing. °· Arson. °· Sabotage. °· Equipment vandalism. °· Damaging or destroying property. °· Throwing or hitting objects. °· Displaying a weapon or an object that appears to be a weapon in a threatening manner. °¨ Carrying a firearm of any kind. °¨ Using a weapon to harm someone. °· Using greater physical size/strength to intimidate another. °¨ Making intimidating or threatening gestures. °¨ Bullying. °¨ Hazing. °· Intimidating, threatening, hostile, or abusive language directed toward another person. °¨ It communicates the intention to engage in violence against that person. And it leads a reasonable person to expect that violent behavior may occur. °· Stalking another person. °IF IT HAPPENS AGAIN: °· Immediately call for emergency help (911 in [place] °· If someone poses clear and immediate danger to you, seek legal authorities to have a protective or restraining order put in place. °· Less threatening assaults can at least be reported to authorities. °STEPS TO TAKE IF A SEXUAL ASSAULT HAS HAPPENED °· Go to an area of safety. This may include a shelter or staying with a friend. Stay away from the area where you have been attacked. A large percentage of sexual assaults are caused by a friend, relative or associate. °· If  medications were given by your caregiver, take them as directed for the full length of time prescribed. °· Only take over-the-counter or prescription medicines for pain, discomfort, or fever as directed by your caregiver. °· If you have come in contact with a sexual disease, find out if you are to be tested again. If your caregiver is concerned about the HIV/AIDS virus, he/she may require you to have continued testing for several months. °· For the protection of your privacy, test results can not be given over the phone. Make sure you receive the results of your test. If your test results are not back during your visit, make an appointment with your caregiver to find out the results. Do not assume everything is normal if you have not heard from your caregiver or the medical facility. It is important for you to follow up on all of your test results. °· File appropriate papers with authorities. This is important in all assaults, even if it has occurred in a family or by a friend. °SEEK MEDICAL CARE IF: °· You have new problems because of your injuries. °· You have problems that may be because of the medicine you are taking, such as: °¨ Rash. °¨ Itching. °¨ Swelling. °¨ Trouble breathing. °· You develop belly (abdominal) pain, feel sick to your stomach (nausea) or are vomiting. °· You begin to run a temperature. °· You need supportive care or referral to a rape crisis center. These are centers with trained personnel who can help you get through this ordeal. °SEEK IMMEDIATE MEDICAL CARE IF: °· You are afraid of being threatened, beaten, or abused. In [place] call 911. °· You   receive new injuries related to abuse.  You develop severe pain in any area injured in the assault or have any change in your condition that concerns you.  You faint or lose consciousness.  You develop chest pain or shortness of breath. Document Released: 10/30/2005 Document Revised: 01/22/2012 Document Reviewed: 06/17/2008 [facility] Patient  Information 2015 [employer], [place]. This information is not intended to replace advice given to you by your health care provider. Make sure you discuss any questions you have with your health care provider. Abrasion An abrasion is a cut or scrape of the skin. Abrasions do not extend through all layers of the skin and most heal within 10 days. It is important to care for your abrasion properly to prevent infection. CAUSES  Most abrasions are caused by falling on, or gliding across, the ground or other surface. When your skin rubs on something, the outer and inner layer of skin rubs off, causing an abrasion. DIAGNOSIS  Your caregiver will be able to diagnose an abrasion during a physical exam.  TREATMENT  Your treatment depends on how large and deep the abrasion is. Generally, your abrasion will be cleaned with water and a mild soap to remove any dirt or debris. An antibiotic ointment may be put over the abrasion to prevent an infection. A bandage (dressing) may be wrapped around the abrasion to keep it from getting dirty.  You may need a tetanus shot if:  You cannot remember when you had your last tetanus shot.  You have never had a tetanus shot.  The injury broke your skin. If you get a tetanus shot, your arm may swell, get red, and feel warm to the touch. This is common and not a problem. If you need a tetanus shot and you choose not to have one, there is a rare chance of getting tetanus. Sickness from tetanus can be serious.  HOME CARE INSTRUCTIONS   If a dressing was applied, change it at least once a day or as directed by your caregiver. If the bandage sticks, soak it off with warm water.   Wash the area with water and a mild soap to remove all the ointment 2 times a day. Rinse off the soap and pat the area dry with a clean towel.   Reapply any ointment as directed by your caregiver. This will help prevent infection and keep the bandage from sticking. Use gauze over the wound and under the  dressing to help keep the bandage from sticking.   Change your dressing right away if it becomes wet or dirty.   Only take over-the-counter or prescription medicines for pain, discomfort, or fever as directed by your caregiver.   Follow up with your caregiver within 24-48 hours for a wound check, or as directed. If you were not given a wound-check appointment, look closely at your abrasion for redness, swelling, or pus. These are signs of infection. SEEK IMMEDIATE MEDICAL CARE IF:   You have increasing pain in the wound.   You have redness, swelling, or tenderness around the wound.   You have pus coming from the wound.   You have a fever or persistent symptoms for more than 2-3 days.  You have a fever and your symptoms suddenly get worse.  You have a bad smell coming from the wound or dressing.  MAKE SURE YOU:   Understand these instructions.  Will watch your condition.  Will get help right away if you are not doing well or get worse. Document Released:  08/09/2005 Document Revised: 10/16/2012 Document Reviewed: 10/03/2011 ExitCare Patient Information 2015 [employer], [place]. This information is not intended to replace advice given to you by your health care provider. Make sure you discuss any questions you have with your health care provider.

## 2015-01-13 MED ORDER — OXYCODONE-ACETAMINOPHEN 5-325 MG PO TABS
2.0000 | ORAL_TABLET | Freq: Once | ORAL | Status: AC
  Administered 2015-01-13: 2 via ORAL
  Filled 2015-01-13: qty 2

## 2015-01-13 NOTE — ED Notes (Signed)
Knee immobilzer placed on pt, pt ambulated out w/ own walker.
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
## 2015-01-28 ENCOUNTER — Emergency Department (HOSPITAL_COMMUNITY): Payer: Medicaid Other

## 2015-01-28 ENCOUNTER — Emergency Department (HOSPITAL_COMMUNITY)
Admission: EM | Admit: 2015-01-28 | Discharge: 2015-01-28 | Disposition: A | Discharge status: Home / Self Care | Payer: Medicaid Other | Attending: Emergency Medicine | Admitting: Emergency Medicine

## 2015-01-28 DIAGNOSIS — S42201A Unspecified fracture of upper end of right humerus, initial encounter for closed fracture: Secondary | ICD-10-CM | POA: Insufficient documentation

## 2015-01-28 DIAGNOSIS — Y939 Activity, unspecified: Secondary | ICD-10-CM | POA: Diagnosis not present

## 2015-01-28 DIAGNOSIS — Z862 Personal history of diseases of the blood and blood-forming organs and certain disorders involving the immune mechanism: Secondary | ICD-10-CM | POA: Insufficient documentation

## 2015-01-28 DIAGNOSIS — Z8701 Personal history of pneumonia (recurrent): Secondary | ICD-10-CM | POA: Diagnosis not present

## 2015-01-28 DIAGNOSIS — W1839XA Other fall on same level, initial encounter: Secondary | ICD-10-CM | POA: Diagnosis not present

## 2015-01-28 DIAGNOSIS — Y999 Unspecified external cause status: Secondary | ICD-10-CM | POA: Insufficient documentation

## 2015-01-28 DIAGNOSIS — Y929 Unspecified place or not applicable: Secondary | ICD-10-CM | POA: Insufficient documentation

## 2015-01-28 DIAGNOSIS — S3992XA Unspecified injury of lower back, initial encounter: Secondary | ICD-10-CM | POA: Insufficient documentation

## 2015-01-28 DIAGNOSIS — G8929 Other chronic pain: Secondary | ICD-10-CM | POA: Diagnosis not present

## 2015-01-28 DIAGNOSIS — Z720 Tobacco use: Secondary | ICD-10-CM | POA: Diagnosis not present

## 2015-01-28 DIAGNOSIS — Z8739 Personal history of other diseases of the musculoskeletal system and connective tissue: Secondary | ICD-10-CM | POA: Diagnosis not present

## 2015-01-28 DIAGNOSIS — I10 Essential (primary) hypertension: Secondary | ICD-10-CM | POA: Diagnosis not present

## 2015-01-28 DIAGNOSIS — Z79899 Other long term (current) drug therapy: Secondary | ICD-10-CM | POA: Diagnosis not present

## 2015-01-28 DIAGNOSIS — S4991XA Unspecified injury of right shoulder and upper arm, initial encounter: Secondary | ICD-10-CM | POA: Diagnosis present

## 2015-01-28 DIAGNOSIS — S42291A Other displaced fracture of upper end of right humerus, initial encounter for closed fracture: Secondary | ICD-10-CM

## 2015-01-28 MED ORDER — METHOCARBAMOL 500 MG PO TABS: 500.0000 mg | ORAL_TABLET | Freq: Four times a day (QID) | ORAL | Status: DC

## 2015-01-28 MED ORDER — KETOROLAC TROMETHAMINE 60 MG/2ML IM SOLN
60.0000 mg | Freq: Once | INTRAMUSCULAR | Status: AC
  Administered 2015-01-28: 60 mg via INTRAMUSCULAR
  Filled 2015-01-28: qty 2

## 2015-01-28 MED ORDER — NAPROXEN 500 MG PO TABS: 500.0000 mg | ORAL_TABLET | Freq: Two times a day (BID) | ORAL | Status: DC
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
## 2015-01-28 NOTE — Discharge Instructions (Signed)
Please read and follow all provided instructions.  Your diagnoses today include:  1. Fracture of humeral head, right, closed, initial encounter    Tests performed today include:  An x-ray of the affected area - shows broken humerus bone in the upper arm/shoulder  Vital signs. See below for your results today.   Medications prescribed:   Robaxin (methocarbamol) - muscle relaxer medication  DO NOT drive or perform any activities that require you to be awake and alert because this medicine can make you drowsy.    Naproxen - anti-inflammatory pain medication  Do not exceed 500mg  naproxen every 12 hours, take with food  You have been prescribed an anti-inflammatory medication or NSAID. Take with food. Take smallest effective dose for the shortest duration needed for your pain. Stop taking if you experience stomach pain or vomiting.   Take any prescribed medications only as directed.  Home care instructions:   Follow any educational materials contained in this packet  Use sling  Follow R.I.C.E. Protocol:  R - rest your injury   I  - use ice on injury without applying directly to skin  C - compress injury with bandage or splint  E - elevate the injury as much as possible  Follow-up instructions: Please follow-up with the provided orthopedic physician (bone specialist) in 1 week.   Return instructions:   Please return if your fingers are numb or tingling, appear gray or blue, or you have severe pain (also elevate the arm and loosen splint or wrap if you were given one)  Please return to the Emergency Department if you experience worsening symptoms.   Please return if you have any other emergent concerns.  Additional Information:  Your vital signs today were: BP 154/98 mmHg   Pulse 78   Temp(Src) 98.1 F (36.7 C) (Oral)   Resp 16   SpO2 99% If your blood pressure (BP) was elevated above 135/85 this visit, please have this repeated by your doctor within one  month. --------------
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
## 2015-03-07 ENCOUNTER — Emergency Department (HOSPITAL_COMMUNITY)
Admission: EM | Admit: 2015-03-07 | Discharge: 2015-03-08 | Disposition: A | Discharge status: Home / Self Care | Payer: Medicaid Other | Attending: Emergency Medicine | Admitting: Emergency Medicine

## 2015-03-07 ENCOUNTER — Encounter (HOSPITAL_COMMUNITY): Payer: Self-pay | Admitting: *Deleted

## 2015-03-07 DIAGNOSIS — I10 Essential (primary) hypertension: Secondary | ICD-10-CM | POA: Insufficient documentation

## 2015-03-07 DIAGNOSIS — F1310 Sedative, hypnotic or anxiolytic abuse, uncomplicated: Secondary | ICD-10-CM | POA: Diagnosis not present

## 2015-03-07 DIAGNOSIS — Z720 Tobacco use: Secondary | ICD-10-CM | POA: Diagnosis not present

## 2015-03-07 DIAGNOSIS — B309 Viral conjunctivitis, unspecified: Secondary | ICD-10-CM | POA: Diagnosis not present

## 2015-03-07 DIAGNOSIS — R509 Fever, unspecified: Secondary | ICD-10-CM | POA: Diagnosis not present

## 2015-03-07 DIAGNOSIS — Z8701 Personal history of pneumonia (recurrent): Secondary | ICD-10-CM | POA: Insufficient documentation

## 2015-03-07 DIAGNOSIS — L97911 Non-pressure chronic ulcer of unspecified part of right lower leg limited to breakdown of skin: Secondary | ICD-10-CM

## 2015-03-07 DIAGNOSIS — L97521 Non-pressure chronic ulcer of other part of left foot limited to breakdown of skin: Secondary | ICD-10-CM | POA: Diagnosis not present

## 2015-03-07 DIAGNOSIS — Z8659 Personal history of other mental and behavioral disorders: Secondary | ICD-10-CM | POA: Insufficient documentation

## 2015-03-07 DIAGNOSIS — Z79899 Other long term (current) drug therapy: Secondary | ICD-10-CM

## 2015-03-07 DIAGNOSIS — F1110 Opioid abuse, uncomplicated: Secondary | ICD-10-CM | POA: Insufficient documentation

## 2015-03-07 DIAGNOSIS — G8929 Other chronic pain: Secondary | ICD-10-CM | POA: Diagnosis not present

## 2015-03-07 DIAGNOSIS — M1990 Unspecified osteoarthritis, unspecified site: Secondary | ICD-10-CM | POA: Diagnosis not present

## 2015-03-07 MED ORDER — IBUPROFEN 200 MG PO TABS
400.0000 mg | ORAL_TABLET | Freq: Once | ORAL | Status: AC
  Administered 2015-03-07: 400 mg via ORAL
  Filled 2015-03-07: qty 2

## 2015-03-07 NOTE — ED Notes (Signed)
Pt arrives via EMS c/o foot blisters x 2 weeks. Also states that his left eye started getting red 4 days ago and now he is having trouble seeing out of it. Also c/o chronic pain to right shoulder.
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
## 2015-03-07 NOTE — ED Notes (Signed)
Pt sleeping, arouses to voice, starts a conversation then falls to sleep during conversation.  After several attempts of arousing patient he reports right shoulder got broke and pain has not improved.  Pt then returns to sleep.

## 2015-03-08 ENCOUNTER — Emergency Department (HOSPITAL_COMMUNITY): Payer: Medicaid Other

## 2015-03-08 LAB — URINALYSIS, ROUTINE W REFLEX MICROSCOPIC
BILIRUBIN URINE: NEGATIVE
Glucose, UA: NEGATIVE mg/dL
Hgb urine dipstick: NEGATIVE
Ketones, ur: NEGATIVE mg/dL
Leukocytes, UA: NEGATIVE
Nitrite: NEGATIVE
PH: 6.5 (ref 5.0–8.0)
PROTEIN: NEGATIVE mg/dL
Specific Gravity, Urine: 1.026 (ref 1.005–1.030)
Urobilinogen, UA: 1 mg/dL (ref 0.0–1.0)

## 2015-03-08 LAB — CBC WITH DIFFERENTIAL/PLATELET
Basophils Absolute: 0 10*3/uL (ref 0.0–0.1)
Basophils Relative: 1 % (ref 0–1)
Eosinophils Absolute: 0 10*3/uL (ref 0.0–0.7)
Eosinophils Relative: 1 % (ref 0–5)
HEMATOCRIT: 32.9 % — AB (ref 39.0–52.0)
Hemoglobin: 10.6 g/dL — ABNORMAL LOW (ref 13.0–17.0)
LYMPHS ABS: 0.6 10*3/uL — AB (ref 0.7–4.0)
Lymphocytes Relative: 15 % (ref 12–46)
MCH: 26.3 pg (ref 26.0–34.0)
MCHC: 32.2 g/dL (ref 30.0–36.0)
MCV: 81.6 fL (ref 78.0–100.0)
MONOS PCT: 10 % (ref 3–12)
Monocytes Absolute: 0.5 10*3/uL (ref 0.1–1.0)
Neutro Abs: 3.2 10*3/uL (ref 1.7–7.7)
Neutrophils Relative %: 73 % (ref 43–77)
Platelets: 168 10*3/uL (ref 150–400)
RBC: 4.03 MIL/uL — ABNORMAL LOW (ref 4.22–5.81)
RDW: 14.3 % (ref 11.5–15.5)
WBC: 4.4 10*3/uL (ref 4.0–10.5)

## 2015-03-08 LAB — COMPREHENSIVE METABOLIC PANEL
ALBUMIN: 2.4 g/dL — AB (ref 3.5–5.2)
ALT: 49 U/L (ref 0–53)
AST: 43 U/L — ABNORMAL HIGH (ref 0–37)
Alkaline Phosphatase: 131 U/L — ABNORMAL HIGH (ref 39–117)
Anion gap: 9 (ref 5–15)
BUN: 15 mg/dL (ref 6–23)
CALCIUM: 8.4 mg/dL (ref 8.4–10.5)
CHLORIDE: 97 mmol/L (ref 96–112)
CO2: 26 mmol/L (ref 19–32)
CREATININE: 0.75 mg/dL (ref 0.50–1.35)
GFR calc Af Amer: >90 mL/min (ref >90)
GFR calc non Af Amer: >90 mL/min (ref >90)
Glucose, Bld: 101 mg/dL — ABNORMAL HIGH (ref 70–99)
POTASSIUM: 3.6 mmol/L (ref 3.5–5.1)
Sodium: 132 mmol/L — ABNORMAL LOW (ref 135–145)
Total Bilirubin: 0.7 mg/dL (ref 0.3–1.2)
Total Protein: 7.3 g/dL (ref 6.0–8.3)

## 2015-03-08 LAB — CBG MONITORING, ED: Glucose-Capillary: 97 mg/dL (ref 70–99)

## 2015-03-08 LAB — RAPID URINE DRUG SCREEN, HOSP PERFORMED
Amphetamines: NOT DETECTED
BARBITURATES: NOT DETECTED
BENZODIAZEPINES: POSITIVE — AB
COCAINE: NOT DETECTED
Opiates: POSITIVE — AB
Tetrahydrocannabinol: NOT DETECTED

## 2015-03-08 LAB — SALICYLATE LEVEL: Salicylate Lvl: <4 mg/dL (ref 2.8–20.0)

## 2015-03-08 LAB — ACETAMINOPHEN LEVEL

## 2015-03-08 LAB — ETHANOL: Alcohol, Ethyl (B): <5 mg/dL (ref 0–9)

## 2015-03-08 NOTE — Discharge Instructions (Signed)
Viral Conjunctivitis Conjunctivitis is an irritation (inflammation) of the clear membrane that covers the white part of the eye (the conjunctiva). The irritation can also happen on the underside of the eyelids. Conjunctivitis makes the eye red or pink in color. This is what is commonly known as pink eye. Viral conjunctivitis can spread easily (contagious). CAUSES   Infection from virus on the surface of the eye.  Infection from the irritation or injury of nearby tissues such as the eyelids or cornea.  More serious inflammation or infection on the inside of the eye.  Other eye diseases.  The use of certain eye medications. SYMPTOMS  The normally white color of the eye or the underside of the eyelid is usually pink or red in color. The pink eye is usually associated with irritation, tearing and some sensitivity to light. Viral conjunctivitis is often associated with a clear, watery discharge. If a discharge is present, there may also be some blurred vision in the affected eye. DIAGNOSIS  Conjunctivitis is diagnosed by an eye exam. The eye specialist looks for changes in the surface tissues of the eye which take on changes characteristic of the specific types of conjunctivitis. A sample of any discharge may be collected on a Q-Tip (sterile swap). The sample will be sent to a lab to see whether or not the inflammation is caused by bacterial or viral infection. TREATMENT  Viral conjunctivitis will not respond to medicines that kill germs (antibiotics). Treatment is aimed at stopping a bacterial infection on top of the viral infection. The goal of treatment is to relieve symptoms (such as itching) with antihistamine drops or other eye medications.  HOME CARE INSTRUCTIONS   To ease discomfort, apply a cool, clean wash cloth to your eye for 10 to 20 minutes, 3 to 4 times a day.  Gently wipe away any drainage from the eye with a warm, wet washcloth or a cotton ball.  Wash your hands often with soap  and use paper towels to dry.  Do not share towels or washcloths. This may spread the infection.  Change or wash your pillowcase every day.  You should not use eye make-up until the infection is gone.  Stop using contacts lenses. Ask your eye professional how to sterilize or replace them before using again. This depends on the type of contact lenses used.  Do not touch the edge of the eyelid with the eye drop bottle or ointment tube when applying medications to the affected eye. This will stop you from spreading the infection to the other eye or to others. SEEK IMMEDIATE MEDICAL CARE IF:   The infection has not improved within 3 days of beginning treatment.  A watery discharge from the eye develops.  Pain in the eye increases.  The redness is spreading.  Vision becomes blurred.  An oral temperature above 102 F (38.9 C) develops, or as your caregiver suggests.  Facial pain, redness or swelling develops.  Any problems that may be related to the prescribed medicine develop. MAKE SURE YOU:   Understand these instructions.  Will watch your condition.  Will get help right away if you are not doing well or get worse. Document Released: 10/30/2005 Document Revised: 01/22/2012 Document Reviewed: 06/18/2008 [facility] Patient Information 2015 [place], [place]. This information is not intended to replace advice given to you by your health care provider. Make sure you discuss any questions you have with your health care provider.  Pressure Ulcer A pressure ulcer is a sore that has formed  from the breakdown of skin and exposure of deeper layers of tissue. It develops in areas of the body where there is unrelieved pressure. Pressure ulcers are usually found over a bony area, such as the shoulder blades, spine, lower back, hips, knees, ankles, and heels. Pressure ulcers vary in severity. Your health care provider may determine the severity (stage) of your pressure ulcer. The stages  include:  Stage I--The skin is red, and when the skin is pressed, it stays red.  Stage II--The top layer of skin is gone, and there is a shallow, pink ulcer.  Stage III--The ulcer becomes deeper, and it is more difficult to see the whole wound. Also, there may be yellow or brown parts, as well as pink and red parts.  Stage IV--The ulcer may be deep and red, pink, brown, white, or yellow. Bone or muscle may be seen.  Unstageable pressure ulcer--The ulcer is covered almost completely with black, brown, or yellow tissue. It is not known how deep the ulcer is or what stage it is until this covering comes off.  Suspected deep tissue injury--A person's skin can be injured from pressure or pulling on the skin when his or her position is changed. The skin appears purple or maroon. There may not be an opening in the skin, but there could be a blood-filled blister. This deep tissue injury is often difficult to see in people with darker skin tones. The site may open and become deeper in time. However, early interventions will help the area heal and may prevent the area from opening. CAUSES  Pressure ulcers are caused by pressure against the skin that limits the flow of blood to the skin and nearby tissues. There are many risk factors that can lead to pressure sores. RISK FACTORS  Decreased ability to move.  Decreased ability to feel pain or discomfort.  Excessive skin moisture from urine, stool, sweat, or secretions.  Poor nutrition.  Dehydration.  Tobacco, drug, or alcohol abuse.  Having someone pull on bedsheets that are under you, such as when health care workers are changing your position in a hospital bed.  Obesity.  Increased adult age.  Hospitalization in a critical care unit for longer than 4 days with use of medical devices.  Prolonged use of medical devices.  Critical illness.  Anemia.  Traumatic brain injury.  Spinal cord injury.  Stroke.  Diabetes.  Poor blood  glucose control.  Low blood pressure (hypotension).  Low oxygen levels.  Medicines that reduce blood flow.  Infection. DIAGNOSIS  Your health care provider will diagnose your pressure ulcer based on its appearance. The health care provider may determine the stage of your pressure ulcer as well. Tests may be done to check for infection, to assess your circulation, or to check for other diseases, such as diabetes. TREATMENT  Treatment of your pressure ulcer begins with determining what stage the ulcer is in. Your treatment team may include your health care provider, a wound care specialist, a nutritionist, a physical therapist, and a Careers adviser. Possible treatments may include:   Moving or repositioning every 1-2 hours.  Using beds or mattresses to shift your body weight and pressure points frequently.  Improving your diet.  Cleaning and bandaging (dressing) the open wound.  Giving antibiotic medicines.  Removing damaged tissue.  Surgery and sometimes skin grafts. HOME CARE INSTRUCTIONS  If you were hospitalized, follow the care plan that was started in the hospital.  Avoid staying in the same position for more than 2  hours. Use padding, devices, or mattresses to cushion your pressure points as directed by your health care provider.  Eat a well-balanced diet. Take nutritional supplements and vitamins as directed by your health care provider.  Keep all follow-up appointments.  Only take over-the-counter or prescription medicines for pain, fever, or discomfort as directed by your health care provider. SEEK MEDICAL CARE IF:   Your pressure ulcer is not improving.  You do not know how to care for your pressure ulcer.  You notice other areas of redness on your skin.  You have a fever. SEEK IMMEDIATE MEDICAL CARE IF:   You have increasing redness, swelling, or pain in your pressure ulcer.  You notice pus coming from your pressure ulcer.  You notice a bad smell coming from  the wound or dressing.  Your pressure ulcer opens up again. Document Released: 10/30/2005 Document Revised: 11/04/2013 Document Reviewed: 07/07/2013 [facility] Patient Information 2015 [place]. This information is not intended to replace advice given to you by your health care provider. Make sure you discuss any questions you have with your health care provider.

## 2015-03-08 NOTE — Progress Notes (Signed)
LCSW was called by RN for consult regarding transportation. Patient has already been discharged, waiting in waiting room.  LCSW met with patient who reports he needs help getting to his wound care appointment. Patient has medicaid, qualifies for medicaid transport with scheduled appointments.  Patient given information to call and schedule 2 days in advance as well as Orthoptist.  Patient uses the bus system and walker thus at this time will not qualify for [facility].   He was given a bus pass today and resources for transportation. Voiced understanding with regards to arranging appointments.  Patient lives in a boarding house, has no problems with returning.    No other needs at this time.  Ethan [name], MSW Clinical Social Work: Emergency Room [phone]

## 2015-03-13 ENCOUNTER — Encounter (HOSPITAL_COMMUNITY): Payer: Self-pay | Admitting: Emergency Medicine

## 2015-03-13 ENCOUNTER — Emergency Department (HOSPITAL_COMMUNITY)
Admission: EM | Admit: 2015-03-13 | Discharge: 2015-03-14 | Disposition: A | Discharge status: Home / Self Care | Payer: Medicaid Other | Attending: Emergency Medicine | Admitting: Emergency Medicine

## 2015-03-13 DIAGNOSIS — Z8701 Personal history of pneumonia (recurrent): Secondary | ICD-10-CM | POA: Diagnosis not present

## 2015-03-13 DIAGNOSIS — I10 Essential (primary) hypertension: Secondary | ICD-10-CM | POA: Diagnosis not present

## 2015-03-13 DIAGNOSIS — Z720 Tobacco use: Secondary | ICD-10-CM | POA: Diagnosis not present

## 2015-03-13 DIAGNOSIS — Z79899 Other long term (current) drug therapy: Secondary | ICD-10-CM | POA: Insufficient documentation

## 2015-03-13 DIAGNOSIS — G8929 Other chronic pain: Secondary | ICD-10-CM | POA: Insufficient documentation

## 2015-03-13 DIAGNOSIS — M1990 Unspecified osteoarthritis, unspecified site: Secondary | ICD-10-CM | POA: Insufficient documentation

## 2015-03-13 DIAGNOSIS — F419 Anxiety disorder, unspecified: Secondary | ICD-10-CM | POA: Diagnosis not present

## 2015-03-13 DIAGNOSIS — M79672 Pain in left foot: Secondary | ICD-10-CM | POA: Diagnosis not present

## 2015-03-13 DIAGNOSIS — M79673 Pain in unspecified foot: Secondary | ICD-10-CM

## 2015-03-13 NOTE — ED Notes (Addendum)
Pt arrived to the ED with a complaint of bilateral foot pain.  Pt has ulcers on both feet.  Pt has been seen for said condition and also sees a pain management clinic for lower back pain.  Pt states that the pain in his feet has intensified.  Pt states he takes 30mg  oxycodone four times a day.  Pt also took and ativan prior to arrival.  Pt is very sleepy in triage

## 2015-03-14 ENCOUNTER — Emergency Department (HOSPITAL_COMMUNITY): Payer: Medicaid Other

## 2015-03-14 LAB — CBC WITH DIFFERENTIAL/PLATELET
BASOS ABS: 0 10*3/uL (ref 0.0–0.1)
Basophils Relative: 0 % (ref 0–1)
Eosinophils Absolute: 0 10*3/uL (ref 0.0–0.7)
Eosinophils Relative: 0 % (ref 0–5)
HCT: 32.1 % — ABNORMAL LOW (ref 39.0–52.0)
Hemoglobin: 10.2 g/dL — ABNORMAL LOW (ref 13.0–17.0)
LYMPHS ABS: 0.4 10*3/uL — AB (ref 0.7–4.0)
LYMPHS PCT: 13 % (ref 12–46)
MCH: 25.4 pg — ABNORMAL LOW (ref 26.0–34.0)
MCHC: 31.8 g/dL (ref 30.0–36.0)
MCV: 79.9 fL (ref 78.0–100.0)
Monocytes Absolute: 0.5 10*3/uL (ref 0.1–1.0)
Monocytes Relative: 17 % — ABNORMAL HIGH (ref 3–12)
NEUTROS ABS: 2 10*3/uL (ref 1.7–7.7)
NEUTROS PCT: 70 % (ref 43–77)
PLATELETS: 138 10*3/uL — AB (ref 150–400)
RBC: 4.02 MIL/uL — AB (ref 4.22–5.81)
RDW: 14.5 % (ref 11.5–15.5)
WBC: 2.9 10*3/uL — AB (ref 4.0–10.5)

## 2015-03-14 LAB — BASIC METABOLIC PANEL
Anion gap: 5 (ref 5–15)
BUN: 18 mg/dL (ref 6–20)
CALCIUM: 8.1 mg/dL — AB (ref 8.9–10.3)
CO2: 27 mmol/L (ref 22–32)
Chloride: 99 mmol/L — ABNORMAL LOW (ref 101–111)
Creatinine, Ser: 0.62 mg/dL (ref 0.61–1.24)
GFR calc Af Amer: >60 mL/min (ref >60)
Glucose, Bld: 151 mg/dL — ABNORMAL HIGH (ref 70–99)
POTASSIUM: 3.2 mmol/L — AB (ref 3.5–5.1)
Sodium: 131 mmol/L — ABNORMAL LOW (ref 135–145)
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
## 2015-03-14 NOTE — ED Notes (Signed)
Pt brought back from triage immediately upon arrival to room patient begins yelling at staff for pain medication. Pt was informed that he must see the doctor. He reports that his last dose of pain meds was at 1400 on 4/30. Pt verbally aggressive towards staff and was made aware current behavior will not be tolerated.

## 2015-03-14 NOTE — ED Notes (Signed)
Pt has been discharged however he is still in room getting dressed. Provided patient with cola per his request. Made patient aware once again he is discharged.

## 2015-03-14 NOTE — ED Notes (Signed)
Bed: WA01 Expected date:  Expected time:  Means of arrival:  Comments: 

## 2015-03-14 NOTE — ED Notes (Signed)
Md at bedside

## 2015-03-14 NOTE — Discharge Instructions (Signed)

## 2015-03-14 NOTE — ED Notes (Signed)
Patient escorted to lobby with off duty Technical sales engineerofficer. Pt was provided with a bus pass.

## 2015-03-14 NOTE — ED Notes (Addendum)
Patient did not want xrays. Dr. Len Blalockame into to talk to patient and patient decided to go to xray.

## 2015-03-29 ENCOUNTER — Encounter (HOSPITAL_BASED_OUTPATIENT_CLINIC_OR_DEPARTMENT_OTHER): Payer: Medicaid Other

## 2015-04-18 ENCOUNTER — Emergency Department (HOSPITAL_COMMUNITY)
Admission: EM | Admit: 2015-04-18 | Discharge: 2015-04-18 | Disposition: A | Discharge status: Home / Self Care | Payer: Medicaid Other | Attending: Emergency Medicine | Admitting: Emergency Medicine

## 2015-04-18 DIAGNOSIS — Z8701 Personal history of pneumonia (recurrent): Secondary | ICD-10-CM | POA: Diagnosis not present

## 2015-04-18 DIAGNOSIS — S90822A Blister (nonthermal), left foot, initial encounter: Secondary | ICD-10-CM | POA: Insufficient documentation

## 2015-04-18 DIAGNOSIS — Y9389 Activity, other specified: Secondary | ICD-10-CM | POA: Diagnosis not present

## 2015-04-18 DIAGNOSIS — F419 Anxiety disorder, unspecified: Secondary | ICD-10-CM | POA: Diagnosis not present

## 2015-04-18 DIAGNOSIS — S90821A Blister (nonthermal), right foot, initial encounter: Secondary | ICD-10-CM | POA: Diagnosis not present

## 2015-04-18 DIAGNOSIS — M79672 Pain in left foot: Secondary | ICD-10-CM

## 2015-04-18 DIAGNOSIS — Y9289 Other specified places as the place of occurrence of the external cause: Secondary | ICD-10-CM | POA: Diagnosis not present

## 2015-04-18 DIAGNOSIS — M79671 Pain in right foot: Secondary | ICD-10-CM

## 2015-04-18 DIAGNOSIS — Y998 Other external cause status: Secondary | ICD-10-CM | POA: Insufficient documentation

## 2015-04-18 DIAGNOSIS — G8929 Other chronic pain: Secondary | ICD-10-CM | POA: Diagnosis not present

## 2015-04-18 DIAGNOSIS — Z79899 Other long term (current) drug therapy: Secondary | ICD-10-CM | POA: Insufficient documentation

## 2015-04-18 DIAGNOSIS — I10 Essential (primary) hypertension: Secondary | ICD-10-CM | POA: Insufficient documentation

## 2015-04-18 DIAGNOSIS — M1990 Unspecified osteoarthritis, unspecified site: Secondary | ICD-10-CM | POA: Insufficient documentation

## 2015-04-18 DIAGNOSIS — R350 Frequency of micturition: Secondary | ICD-10-CM | POA: Insufficient documentation

## 2015-04-18 DIAGNOSIS — X58XXXA Exposure to other specified factors, initial encounter: Secondary | ICD-10-CM | POA: Insufficient documentation

## 2015-04-18 DIAGNOSIS — Z720 Tobacco use: Secondary | ICD-10-CM | POA: Insufficient documentation

## 2015-04-18 LAB — URINALYSIS, ROUTINE W REFLEX MICROSCOPIC
Glucose, UA: NEGATIVE mg/dL
Hgb urine dipstick: NEGATIVE
Ketones, ur: NEGATIVE mg/dL
Nitrite: NEGATIVE
Protein, ur: NEGATIVE mg/dL
Specific Gravity, Urine: 1.029 (ref 1.005–1.030)
Urobilinogen, UA: 2 mg/dL — ABNORMAL HIGH (ref 0.0–1.0)
pH: 5.5 (ref 5.0–8.0)

## 2015-04-18 LAB — URINE MICROSCOPIC-ADD ON

## 2015-04-18 MED ORDER — ACETAMINOPHEN 325 MG PO TABS
650.0000 mg | ORAL_TABLET | Freq: Once | ORAL | Status: AC
  Administered 2015-04-18: 650 mg via ORAL
  Filled 2015-04-18: qty 2

## 2015-04-18 MED ORDER — ACETAMINOPHEN 500 MG PO TABS: 500.0000 mg | ORAL_TABLET | Freq: Four times a day (QID) | ORAL | Status: DC | PRN

## 2015-04-18 MED ORDER — IBUPROFEN 800 MG PO TABS
800.0000 mg | ORAL_TABLET | Freq: Once | ORAL | Status: DC
  Filled 2015-04-18: qty 1

## 2015-04-18 NOTE — ED Notes (Signed)
Pt is currently sleeping,  No complaints

## 2015-04-18 NOTE — ED Notes (Signed)
[facility] Expected date:  Expected time:  Means of arrival:  Comments: 54 yo M  Sores on feet

## 2015-04-18 NOTE — ED Notes (Signed)
Per EMS pt was sleeping on a bench outside of [employer] and a lady saw him laying there and stopped to ask him if he was ok,  He ask her to call 911 because he didn't feel good, his feet were hurting.

## 2015-04-18 NOTE — ED Notes (Signed)
Pt returned from bathroom,  Writer will cleanse feet now and apply medication and wrap them

## 2015-04-18 NOTE — ED Notes (Signed)
Previous staff stated that patient urine sample was sent down to lab.

## 2015-04-18 NOTE — ED Notes (Signed)
Pt is aware if he give a urine sample we will check for UTI,  One of his complaints is urinary frequency but he has been here 3 hours and no need to urinate
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
## 2015-04-30 ENCOUNTER — Emergency Department (HOSPITAL_COMMUNITY)
Admission: EM | Admit: 2015-04-30 | Discharge: 2015-05-01 | Disposition: A | Discharge status: Home / Self Care | Payer: Medicaid Other | Attending: Emergency Medicine | Admitting: Emergency Medicine

## 2015-04-30 ENCOUNTER — Encounter (HOSPITAL_COMMUNITY): Payer: Self-pay | Admitting: Emergency Medicine

## 2015-04-30 DIAGNOSIS — G8929 Other chronic pain: Secondary | ICD-10-CM | POA: Diagnosis not present

## 2015-04-30 DIAGNOSIS — M1990 Unspecified osteoarthritis, unspecified site: Secondary | ICD-10-CM | POA: Insufficient documentation

## 2015-04-30 DIAGNOSIS — R609 Edema, unspecified: Secondary | ICD-10-CM | POA: Insufficient documentation

## 2015-04-30 DIAGNOSIS — F1310 Sedative, hypnotic or anxiolytic abuse, uncomplicated: Secondary | ICD-10-CM | POA: Insufficient documentation

## 2015-04-30 DIAGNOSIS — Z79899 Other long term (current) drug therapy: Secondary | ICD-10-CM | POA: Diagnosis not present

## 2015-04-30 DIAGNOSIS — Z8701 Personal history of pneumonia (recurrent): Secondary | ICD-10-CM | POA: Diagnosis not present

## 2015-04-30 DIAGNOSIS — I10 Essential (primary) hypertension: Secondary | ICD-10-CM | POA: Insufficient documentation

## 2015-04-30 DIAGNOSIS — F419 Anxiety disorder, unspecified: Secondary | ICD-10-CM | POA: Diagnosis not present

## 2015-04-30 DIAGNOSIS — F1110 Opioid abuse, uncomplicated: Secondary | ICD-10-CM | POA: Diagnosis not present

## 2015-04-30 DIAGNOSIS — R0902 Hypoxemia: Secondary | ICD-10-CM | POA: Insufficient documentation

## 2015-04-30 DIAGNOSIS — M7989 Other specified soft tissue disorders: Secondary | ICD-10-CM | POA: Diagnosis present

## 2015-04-30 DIAGNOSIS — F1910 Other psychoactive substance abuse, uncomplicated: Secondary | ICD-10-CM

## 2015-04-30 DIAGNOSIS — Z720 Tobacco use: Secondary | ICD-10-CM | POA: Insufficient documentation

## 2015-04-30 DIAGNOSIS — F1210 Cannabis abuse, uncomplicated: Secondary | ICD-10-CM | POA: Diagnosis not present

## 2015-04-30 NOTE — ED Notes (Addendum)
Per EMS , pt. Was picked up at the bathroom at [facility] with complaint of unable to walk  Due to swelling on his bilateral lower legs. Pt. Was alert and oriented x3, denies ETOH , pt. 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 Pt. Appeared sleepy but able to answer verbal command.

## 2015-04-30 NOTE — ED Notes (Signed)
Bed: WA09 Expected date:  Expected time:  Means of arrival:  Comments: EMS 63M EDEMA LE

## 2015-05-01 ENCOUNTER — Emergency Department (HOSPITAL_COMMUNITY): Payer: Medicaid Other

## 2015-05-01 LAB — BLOOD GAS, ARTERIAL
ACID-BASE EXCESS: 3.7 mmol/L — AB (ref 0.0–2.0)
Bicarbonate: 28.2 mEq/L — ABNORMAL HIGH (ref 20.0–24.0)
DRAWN BY: 308601
O2 CONTENT: 2 L/min
O2 Saturation: 97.7 %
Patient temperature: 98.6
TCO2: 26.1 mmol/L (ref 0–100)
pCO2 arterial: 44.8 mmHg (ref 35.0–45.0)
pH, Arterial: 7.415 (ref 7.350–7.450)
pO2, Arterial: 107 mmHg — ABNORMAL HIGH (ref 80.0–100.0)

## 2015-05-01 LAB — CBC WITH DIFFERENTIAL/PLATELET
BASOS ABS: 0 10*3/uL (ref 0.0–0.1)
Basophils Relative: 0 % (ref 0–1)
Eosinophils Absolute: 0.2 10*3/uL (ref 0.0–0.7)
Eosinophils Relative: 4 % (ref 0–5)
HEMATOCRIT: 31.2 % — AB (ref 39.0–52.0)
Hemoglobin: 10.1 g/dL — ABNORMAL LOW (ref 13.0–17.0)
LYMPHS PCT: 20 % (ref 12–46)
Lymphs Abs: 1.1 10*3/uL (ref 0.7–4.0)
MCH: 26 pg (ref 26.0–34.0)
MCHC: 32.4 g/dL (ref 30.0–36.0)
MCV: 80.2 fL (ref 78.0–100.0)
Monocytes Absolute: 0.9 10*3/uL (ref 0.1–1.0)
Monocytes Relative: 17 % — ABNORMAL HIGH (ref 3–12)
NEUTROS PCT: 59 % (ref 43–77)
Neutro Abs: 3.2 10*3/uL (ref 1.7–7.7)
PLATELETS: 223 10*3/uL (ref 150–400)
RBC: 3.89 MIL/uL — ABNORMAL LOW (ref 4.22–5.81)
RDW: 16.6 % — ABNORMAL HIGH (ref 11.5–15.5)
WBC: 5.4 10*3/uL (ref 4.0–10.5)

## 2015-05-01 LAB — BASIC METABOLIC PANEL
Anion gap: 7 (ref 5–15)
BUN: 14 mg/dL (ref 6–20)
CO2: 30 mmol/L (ref 22–32)
CREATININE: 0.78 mg/dL (ref 0.61–1.24)
Calcium: 8.2 mg/dL — ABNORMAL LOW (ref 8.9–10.3)
Chloride: 98 mmol/L — ABNORMAL LOW (ref 101–111)
GFR calc Af Amer: >60 mL/min (ref >60)
GLUCOSE: 104 mg/dL — AB (ref 65–99)
Potassium: 3.3 mmol/L — ABNORMAL LOW (ref 3.5–5.1)
SODIUM: 135 mmol/L (ref 135–145)

## 2015-05-01 LAB — URINALYSIS, ROUTINE W REFLEX MICROSCOPIC
GLUCOSE, UA: NEGATIVE mg/dL
HGB URINE DIPSTICK: NEGATIVE
Ketones, ur: NEGATIVE mg/dL
Leukocytes, UA: NEGATIVE
Nitrite: NEGATIVE
PROTEIN: NEGATIVE mg/dL
SPECIFIC GRAVITY, URINE: 1.035 — AB (ref 1.005–1.030)
UROBILINOGEN UA: 1 mg/dL (ref 0.0–1.0)
pH: 5 (ref 5.0–8.0)

## 2015-05-01 LAB — RAPID URINE DRUG SCREEN, HOSP PERFORMED
AMPHETAMINES: NOT DETECTED
BENZODIAZEPINES: POSITIVE — AB
Barbiturates: NOT DETECTED
Cocaine: NOT DETECTED
Opiates: POSITIVE — AB
TETRAHYDROCANNABINOL: POSITIVE — AB

## 2015-05-01 LAB — ACETAMINOPHEN LEVEL: Acetaminophen (Tylenol), Serum: <10 ug/mL — ABNORMAL LOW (ref 10–30)

## 2015-05-01 LAB — TROPONIN I

## 2015-05-01 LAB — SALICYLATE LEVEL: Salicylate Lvl: <4 mg/dL (ref 2.8–30.0)

## 2015-05-01 LAB — AMMONIA: AMMONIA: 31 umol/L (ref 9–35)

## 2015-05-01 LAB — APTT: aPTT: 37 seconds (ref 24–37)

## 2015-05-01 LAB — BRAIN NATRIURETIC PEPTIDE: B Natriuretic Peptide: 24.7 pg/mL (ref 0.0–100.0)

## 2015-05-01 LAB — ETHANOL

## 2015-05-01 MED ORDER — FUROSEMIDE 40 MG PO TABS
20.0000 mg | ORAL_TABLET | Freq: Once | ORAL | Status: AC
  Administered 2015-05-01: 20 mg via ORAL
  Filled 2015-05-01: qty 1

## 2015-05-01 MED ORDER — FUROSEMIDE 20 MG PO TABS: 20.0000 mg | ORAL_TABLET | Freq: Every day | ORAL | Status: DC

## 2015-05-01 NOTE — ED Notes (Signed)
EKG given to EDP, Wofford,MD., for review. 

## 2015-05-01 NOTE — ED Notes (Signed)
Pt refuses to let RN check vital signs prior to discharge.

## 2015-05-01 NOTE — ED Notes (Addendum)
Pt. Made aware for the need of urine. 

## 2015-05-01 NOTE — ED Notes (Signed)
Pt. On monitor per CN,Autumn and pt. Oxygen level sat at 82%. Pt. Placed on 2 liters oxygen.
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
## 2015-05-01 NOTE — ED Notes (Signed)
MD at bedside. 

## 2015-05-01 NOTE — Discharge Instructions (Signed)
Follow up with your family md next week. °

## 2015-05-04 ENCOUNTER — Encounter (HOSPITAL_COMMUNITY): Payer: Self-pay | Admitting: Emergency Medicine

## 2015-05-04 ENCOUNTER — Emergency Department (HOSPITAL_BASED_OUTPATIENT_CLINIC_OR_DEPARTMENT_OTHER)
Admit: 2015-05-04 | Discharge: 2015-05-04 | Disposition: A | Discharge status: Home / Self Care | Payer: Medicaid Other | Attending: Emergency Medicine | Admitting: Emergency Medicine

## 2015-05-04 ENCOUNTER — Emergency Department (HOSPITAL_COMMUNITY): Payer: Medicaid Other

## 2015-05-04 ENCOUNTER — Emergency Department (HOSPITAL_COMMUNITY)
Admission: EM | Admit: 2015-05-04 | Discharge: 2015-05-04 | Disposition: A | Discharge status: Home / Self Care | Payer: Medicaid Other | Attending: Emergency Medicine | Admitting: Emergency Medicine

## 2015-05-04 DIAGNOSIS — R609 Edema, unspecified: Secondary | ICD-10-CM

## 2015-05-04 DIAGNOSIS — L97409 Non-pressure chronic ulcer of unspecified heel and midfoot with unspecified severity: Secondary | ICD-10-CM

## 2015-05-04 DIAGNOSIS — F1910 Other psychoactive substance abuse, uncomplicated: Secondary | ICD-10-CM

## 2015-05-04 DIAGNOSIS — F419 Anxiety disorder, unspecified: Secondary | ICD-10-CM | POA: Insufficient documentation

## 2015-05-04 DIAGNOSIS — G629 Polyneuropathy, unspecified: Secondary | ICD-10-CM | POA: Diagnosis not present

## 2015-05-04 DIAGNOSIS — Z79899 Other long term (current) drug therapy: Secondary | ICD-10-CM | POA: Insufficient documentation

## 2015-05-04 DIAGNOSIS — F1210 Cannabis abuse, uncomplicated: Secondary | ICD-10-CM | POA: Insufficient documentation

## 2015-05-04 DIAGNOSIS — F1310 Sedative, hypnotic or anxiolytic abuse, uncomplicated: Secondary | ICD-10-CM | POA: Insufficient documentation

## 2015-05-04 DIAGNOSIS — I10 Essential (primary) hypertension: Secondary | ICD-10-CM | POA: Insufficient documentation

## 2015-05-04 DIAGNOSIS — Z720 Tobacco use: Secondary | ICD-10-CM | POA: Diagnosis not present

## 2015-05-04 DIAGNOSIS — G8929 Other chronic pain: Secondary | ICD-10-CM | POA: Diagnosis not present

## 2015-05-04 DIAGNOSIS — Z8701 Personal history of pneumonia (recurrent): Secondary | ICD-10-CM | POA: Diagnosis not present

## 2015-05-04 DIAGNOSIS — Q899 Congenital malformation, unspecified: Secondary | ICD-10-CM

## 2015-05-04 DIAGNOSIS — R600 Localized edema: Secondary | ICD-10-CM

## 2015-05-04 DIAGNOSIS — M1990 Unspecified osteoarthritis, unspecified site: Secondary | ICD-10-CM | POA: Diagnosis not present

## 2015-05-04 DIAGNOSIS — F1110 Opioid abuse, uncomplicated: Secondary | ICD-10-CM | POA: Diagnosis not present

## 2015-05-04 DIAGNOSIS — L97422 Non-pressure chronic ulcer of left heel and midfoot with fat layer exposed: Secondary | ICD-10-CM

## 2015-05-04 HISTORY — DX: Opioid use, unspecified, uncomplicated: F11.90

## 2015-05-04 LAB — CBC WITH DIFFERENTIAL/PLATELET
Basophils Absolute: 0 10*3/uL (ref 0.0–0.1)
Basophils Relative: 0 % (ref 0–1)
EOS PCT: 3 % (ref 0–5)
Eosinophils Absolute: 0.2 10*3/uL (ref 0.0–0.7)
HCT: 30.8 % — ABNORMAL LOW (ref 39.0–52.0)
Hemoglobin: 9.7 g/dL — ABNORMAL LOW (ref 13.0–17.0)
LYMPHS ABS: 1.1 10*3/uL (ref 0.7–4.0)
Lymphocytes Relative: 19 % (ref 12–46)
MCH: 25 pg — ABNORMAL LOW (ref 26.0–34.0)
MCHC: 31.5 g/dL (ref 30.0–36.0)
MCV: 79.4 fL (ref 78.0–100.0)
MONOS PCT: 10 % (ref 3–12)
Monocytes Absolute: 0.6 10*3/uL (ref 0.1–1.0)
Neutro Abs: 3.9 10*3/uL (ref 1.7–7.7)
Neutrophils Relative %: 68 % (ref 43–77)
Platelets: 214 10*3/uL (ref 150–400)
RBC: 3.88 MIL/uL — ABNORMAL LOW (ref 4.22–5.81)
RDW: 16.3 % — AB (ref 11.5–15.5)
WBC: 5.8 10*3/uL (ref 4.0–10.5)

## 2015-05-04 LAB — ETHANOL

## 2015-05-04 LAB — RAPID URINE DRUG SCREEN, HOSP PERFORMED
Amphetamines: NOT DETECTED
BARBITURATES: NOT DETECTED
BENZODIAZEPINES: POSITIVE — AB
Cocaine: NOT DETECTED
Opiates: POSITIVE — AB
TETRAHYDROCANNABINOL: POSITIVE — AB

## 2015-05-04 LAB — COMPREHENSIVE METABOLIC PANEL
ALT: 59 U/L (ref 17–63)
ANION GAP: 6 (ref 5–15)
AST: 78 U/L — ABNORMAL HIGH (ref 15–41)
Albumin: 2.5 g/dL — ABNORMAL LOW (ref 3.5–5.0)
Alkaline Phosphatase: 171 U/L — ABNORMAL HIGH (ref 38–126)
BUN: 11 mg/dL (ref 6–20)
CO2: 33 mmol/L — ABNORMAL HIGH (ref 22–32)
Calcium: 8.2 mg/dL — ABNORMAL LOW (ref 8.9–10.3)
Chloride: 97 mmol/L — ABNORMAL LOW (ref 101–111)
Creatinine, Ser: 0.67 mg/dL (ref 0.61–1.24)
GFR calc non Af Amer: >60 mL/min (ref >60)
GLUCOSE: 85 mg/dL (ref 65–99)
Potassium: 3.5 mmol/L (ref 3.5–5.1)
Sodium: 136 mmol/L (ref 135–145)
TOTAL PROTEIN: 7 g/dL (ref 6.5–8.1)
Total Bilirubin: 1.5 mg/dL — ABNORMAL HIGH (ref 0.3–1.2)

## 2015-05-04 LAB — D-DIMER, QUANTITATIVE: D-Dimer, Quant: 1.81 ug/mL-FEU — ABNORMAL HIGH (ref 0.00–0.48)

## 2015-05-04 NOTE — ED Notes (Signed)
MD at bedside. 

## 2015-05-04 NOTE — Discharge Instructions (Signed)
Edema °Edema is an abnormal buildup of fluids in your body tissues. Edema is somewhat dependent on gravity to pull the fluid to the lowest place in your body. That makes the condition more common in the legs and thighs (lower extremities). Painless swelling of the feet and ankles is common and becomes more likely as you get older. It is also common in looser tissues, like around your eyes.  °When the affected area is squeezed, the fluid may move out of that spot and leave a dent for a few moments. This dent is called pitting.  °CAUSES  °There are many possible causes of edema. Eating too much salt and being on your feet or sitting for a long time can cause edema in your legs and ankles. Hot weather may make edema worse. Common medical causes of edema include: °· Heart failure. °· Liver disease. °· Kidney disease. °· Weak blood vessels in your legs. °· Cancer. °· An injury. °· Pregnancy. °· Some medications. °· Obesity.  °SYMPTOMS  °Edema is usually painless. Your skin may look swollen or shiny.  °DIAGNOSIS  °Your health care provider may be able to diagnose edema by asking about your medical history and doing a physical exam. You may need to have tests such as X-rays, an electrocardiogram, or blood tests to check for medical conditions that may cause edema.  °TREATMENT  °Edema treatment depends on the cause. If you have heart, liver, or kidney disease, you need the treatment appropriate for these conditions. General treatment may include: °· Elevation of the affected body part above the level of your heart. °· Compression of the affected body part. Pressure from elastic bandages or support stockings squeezes the tissues and forces fluid back into the blood vessels. This keeps fluid from entering the tissues. °· Restriction of fluid and salt intake. °· Use of a water pill (diuretic). These medications are appropriate only for some types of edema. They pull fluid out of your body and make you urinate more often. This  gets rid of fluid and reduces swelling, but diuretics can have side effects. Only use diuretics as directed by your health care provider. °HOME CARE INSTRUCTIONS  °· Keep the affected body part above the level of your heart when you are lying down.   °· Do not sit still or stand for prolonged periods.   °· Do not put anything directly under your knees when lying down. °· Do not wear constricting clothing or garters on your upper legs.   °· Exercise your legs to work the fluid back into your blood vessels. This may help the swelling go down.   °· Wear elastic bandages or support stockings to reduce ankle swelling as directed by your health care provider.   °· Eat a low-salt diet to reduce fluid if your health care provider recommends it.   °· Only take medicines as directed by your health care provider.  °SEEK MEDICAL CARE IF:  °· Your edema is not responding to treatment. °· You have heart, liver, or kidney disease and notice symptoms of edema. °· You have edema in your legs that does not improve after elevating them.   °· You have sudden and unexplained weight gain. °SEEK IMMEDIATE MEDICAL CARE IF:  °· You develop shortness of breath or chest pain.   °· You cannot breathe when you lie down. °· You develop pain, redness, or warmth in the swollen areas.   °· You have heart, liver, or kidney disease and suddenly get edema. °· You have a fever and your symptoms suddenly get worse. °MAKE SURE YOU:  °·   Understand these instructions.  Will watch your condition.  Will get help right away if you are not doing well or get worse. Document Released: 10/30/2005 Document Revised: 03/16/2014 Document Reviewed: 08/22/2013 [facility] Patient Information 2015 [place], [place]. This information is not intended to replace advice given to you by your health care provider. Make sure you discuss any questions you have with your health care provider.  Skin Ulcer A skin ulcer is an open sore that can be shallow or deep. Skin ulcers  sometimes become infected and are difficult to treat. It may be 1 month or longer before real healing progress is made. CAUSES   Injury.  Problems with the veins or arteries.  Diabetes.  Insect bites.  Bedsores.  Inflammatory conditions. SYMPTOMS   Pain, redness, swelling, and tenderness around the ulcer.  Fever.  Bleeding from the ulcer.  Yellow or clear fluid coming from the ulcer. DIAGNOSIS  There are many types of skin ulcers. Any open sores will be examined. Certain tests will be done to determine the kind of ulcer you have. The right treatment depends on the type of ulcer you have. TREATMENT  Treatment is a long-term challenge. It may include:  Wearing an elastic wrap, compression stockings, or gel cast over the ulcer area.  Taking antibiotic medicines or putting antibiotic creams on the affected area if there is an infection. HOME CARE INSTRUCTIONS  Put on your bandages (dressings), wraps, or casts over the ulcer as directed by your caregiver.  Change all dressings as directed by your caregiver.  Take all medicines as directed by your caregiver.  Keep the affected area clean and dry.  Avoid injuries to the affected area.  Eat a well-balanced, healthy diet that includes plenty of fruit and vegetables.  If you smoke, consider quitting or decreasing the amount of cigarettes you smoke.  Once the ulcer heals, get regular exercise as directed by your caregiver.  Work with your caregiver to make sure your blood pressure, cholesterol, and diabetes are well-controlled.  Keep your skin moisturized. Dry skin can crack and lead to skin ulcers. SEEK IMMEDIATE MEDICAL CARE IF:   Your pain gets worse.  You have swelling, redness, or fluids around the ulcer.  You have chills.  You have a fever. MAKE SURE YOU:   Understand these instructions.  Will watch your condition.  Will get help right away if you are not doing well or get worse. Document Released:  12/07/2004 Document Revised: 01/22/2012 Document Reviewed: 06/16/2011 [facility] Patient Information 2015 [place], [place]. This information is not intended to replace advice given to you by your health care provider. Make sure you discuss any questions you have with your health care provider.   Emergency Department Resource Guide 1) Find a Doctor and Pay Out of Pocket Although you won't have to find out who is covered by your insurance plan, it is a good idea to ask around and get recommendations. You will then need to call the office and see if the doctor you have chosen will accept you as a new patient and what types of options they offer for patients who are self-pay. Some doctors offer discounts or will set up payment plans for their patients who do not have insurance, but you will need to ask so you aren't surprised when you get to your appointment.  2) Contact Your Local Health Department Not all health departments have doctors that can see patients for sick visits, but many do, so it is worth a call to  see if yours does. If you don't know where your local health department is, you can check in your phone book. The CDC also has a tool to help you locate your state's health department, and many state websites also have listings of all of their local health departments.  3) Find a Walk-in Clinic If your illness is not likely to be very severe or complicated, you may want to try a walk in clinic. These are popping up all over the country in pharmacies, drugstores, and shopping centers. They're usually staffed by nurse practitioners or physician assistants that have been trained to treat common illnesses and complaints. They're usually fairly quick and inexpensive. However, if you have serious medical issues or chronic medical problems, these are probably not your best option.  No Primary Care Doctor: - Call Health Connect at  [phone] - they can help you locate a primary care doctor that  accepts  your insurance, provides certain services, etc. - Physician Referral Service- [phone]  Chronic Pain Problems: Organization         Address  Phone   Notes  [facility]  [phone] Patients need to be referred by their primary care doctor.   Medication Assistance: Organization         Address  Phone   Notes  [facility] Medication [facility] [address] [place] [place], [place] [address] [phone] --Must be a resident of [facility] -- Must have NO insurance coverage whatsoever (no Medicaid/ Medicare, etc.) -- The pt. MUST have a primary care doctor that directs their care regularly and follows them in the community   [facility]  [phone]   [employer]  [phone]    Agencies that provide inexpensive medical care: Organization         Address  Phone   Notes  [facility]  [phone]   [facility]    [phone]   [facility] [address] [place], [place] [address] [phone]   [facility] [address] [place] [place] [phone]   Planned Parenthood    [phone]   [facility]    [phone]   [facility]  [address], [place] Phone:  [phone], Fax:  [phone] Hours of Operation:  9 am - 6 pm, M-F.  Also accepts Medicaid/Medicare and self-pay.  [facility]  [address], [place] Phone: [phone], Fax: [phone]. Hours of Operation:  8:30 am - 5:30 pm, M-F.  Also accepts Medicaid and self-pay.  [facility] [place] [address] [place] Phone: [phone]   [facility] [address] Natasha Bence [place], [place] [phone], Ext. [phone] Mondays & Thursdays: 7-9 AM.  First 15 patients are seen on a first come, first serve basis.    Medicaid-accepting [facility] Providers:  Organization          Address  Phone   Notes  [facility] [address], [place] [phone] Also accepts self-pay patients.  [facility] [address] Laurell Josephs [place], [place]  [phone]   [facility] [address], [place] [phone]   [facility] [address], [place] [phone]   Renaye Rakers [address], [place]   [phone] Only accepts [place] Access Medicaid patients after they have their name applied to their card.   Self-Pay (no insurance) in [facility]:  Organization         Address  Phone   Notes  Sickle Cell Patients, [facility] [address] [place], [place] [phone]   [facility] [address] [place], [place] [phone]   [facility] [place]  [address], [place] [phone]   [facility]/Dr. Osei-Bonsu  [address], [place] or [address], [place] [phone] Phone number for both [place] and [place] locations is the same.  [facility]gent Medical and [facility] [address] [place] [phone]   [facility] [address], [place] or [address] [phone]   [facility] [address], [place] [phone], phone; [phone], fax Sees patients 1st and 3rd Saturday of every month.  Must not qualify for public or private insurance (i.e. Medicaid, Medicare, [place] Health Choice, Veterans' Benefits)  Household income should be no more than 200% of the poverty level The clinic cannot treat you if you are pregnant or think you are pregnant  Sexually transmitted diseases are not treated at the clinic.    Dental Care: Organization         Address  Phone  Notes  [facility] Department of [facility] [address] [place],  [place] [phone] Accepts children up to age 30 who are enrolled in [place] or [place] Health Choice; pregnant women with a Medicaid card; and children who have applied for Medicaid or [place] Health Choice, but were declined, whose parents can pay a reduced fee at time of service.  [facility] Department of [facility]  [address], [place] [phone] Accepts children up to age 72 who are enrolled in [place] or [place] Health Choice; pregnant women with a Medicaid card; and children who have applied for Medicaid or [place] Health Choice, but were declined, whose parents can pay a reduced fee at time of service.  [facility]  [address] [place], [place] [phone] Patients are seen by appointment only. Walk-ins are not accepted. [facility] will see patients 69 years of age and older. Monday - Tuesday (8am-5pm) Most Wednesdays (8:30-5pm) $30 per visit, cash only  [facility]  [address], [facility] [phone] Patients are seen by appointment only. Walk-ins are not accepted. [facility] will see patients 40 years of age and older. One Wednesday Evening (Monthly: Volunteer Based).  $30 per visit, cash only  [employer]  [phone] for adults; Children under age 76, call Graduate Pediatric Dentistry at [phone]. Children aged 47-14, please call [phone] to request a pediatric application.  Dental services are provided in all areas of dental care including fillings, crowns and bridges, complete and partial dentures, implants, gum treatment, root canals, and extractions. Preventive care is also provided. Treatment is provided to both adults and children. Patients are selected via a lottery and there is often a waiting list.   [facility] [address] [place]  [phone] [email or web address]   [facility] [address] [place], [place]  [phone], Ext. [phone] Second and Fourth Thursday of each month, opens at 6:30  AM; Clinic ends at 9 AM.  Patients are seen on a first-come first-served basis, and a limited number are seen during each clinic.   [facility]  [address] Ether Griffins [place], [place] [phone]   Eligibility Requirements You must have lived in [place], [place], or [place] counties for at least the last three months.   You cannot be eligible for state or federal sponsored [place], including [employer], [place], or [employer].   You generally cannot be eligible for healthcare insurance through your employer.    How to apply: Eligibility screenings are held every Tuesday and Wednesday afternoon from 1:00 pm until 4:00 pm. You do not need an appointment for the interview!  [facility] [address], [place], [place] [phone]   [facility] Department  [phone]   [facility] Health Department  [phone]   [facility] Health Department  [phone]    Behavioral Health Resources in the Community: Intensive Outpatient Programs Organization         Address  Phone  Notes  [facility] Services [address], [place], [place] [phone]   [facility] [address] [place], [place] [phone]   [facility] [address], [place], [place]  [phone]   [facility] Mental Health [address],  [place], [place] [phone] or [phone]   Substance Abuse Resources Organization         Address  Phone  Notes  [facility]  [phone]   [facility]  [phone]   [facility] [place]  [phone]   Floydene Flock  [phone]   Residential & Outpatient Substance Abuse Program  [phone]   Psychological Services Organization         Address  Phone  Notes  [facility]  [phone]   [facility] Services  [phone]    [facility] Mental Health [address], [place] [phone] or [phone]    Mobile Crisis Teams Organization         Address  Phone  Notes  [facility], Mobile Crisis Care Unit  [phone]   [facility]  [address] [place], [place] [phone]   Doristine Locks [address] [place] [phone]    Self-Help/Support Groups Organization         Address  Phone             Notes  [facility] [place] - variety of support groups  336- I7437963 Call for more information  Narcotics Anonymous (NA), [facility] [address], [employer] [place]  2 meetings at this location   Statistician         Address  Phone  Notes  [facility] [address],    [place]  [phone]   [facility]  [address] [place] [address]12527, [place], [place] [phone]   [facility] [address] [place], [place] [phone] Admissions: 8am-3pm M-F  [facility] [address]    [place], [place] [phone]   [facility] [address] Starling Manns [place], [place] [phone]   [facility] [address],  [place], [place] [phone]   [facility] - Intensive Outpatient [address] Laurell Josephs [address], [place], [place] [phone]   [facility] ([facility] [address] [place], [place] [phone] or [phone]  [facility] ([facility]) [address], [place], [place] [phone] Accepts Medicaid  [facility] [address]  [place] [phone] Substance Abuse/Addiction Treatment   [facility] Organization         Address  Phone  Notes  [facility]  [phone]   Angie Fava, PhD [address] [place] [address] Ervin Knack [place], [place]   [phone] or [phone]   [facility]al   [address] [place], [place] [phone]   [facility] [address], [place], [place] [phone] Insurance/Medicaid/sponsorship through [facility] and Families [address]                                    [place], [place] [phone] Therapy/tele-psych/case  [facility] [address], [place] [phone]    Dr. Lolly Mustache  [phone]   [facility]  [employer] [facility] Dept. 1) [address] [place] 2) [address], [place] 3)  [address], [place] [phone]  [phone]   [facility] Child Abuse Hotline [phone] or [phone] (After Hours)

## 2015-05-04 NOTE — Progress Notes (Addendum)
pcp is [facility] [address] [place], [place] [address] [phone]

## 2015-05-04 NOTE — ED Provider Notes (Signed)
US done. Neg for DVT b/l.   Raeford Razor, MD 05/04/15 1245

## 2015-05-04 NOTE — Progress Notes (Signed)
*  Preliminary Results* Bilateral lower extremity venous duplex completed. Visualized veins of bilateral lower extremities are negative for deep vein thrombosis. There is no evidence of Baker's cyst bilaterally.  05/04/2015  Gertie Fey, RVT, RDCS, RDMS
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
## 2015-05-04 NOTE — ED Notes (Signed)
Per EMS pt is c/o bilateral foot and leg pain and swelling  Pt has swelling noted esp to left leg  Pt is a known heroin abuser  Pt was found outside the [employer]

## 2015-06-20 ENCOUNTER — Encounter (HOSPITAL_COMMUNITY): Payer: Self-pay | Admitting: Emergency Medicine

## 2015-06-20 ENCOUNTER — Inpatient Hospital Stay (HOSPITAL_COMMUNITY)
Admission: EM | Admit: 2015-06-20 | Discharge: 2015-06-22 | DRG: 872 | Disposition: A | Discharge status: Home / Self Care | Payer: Medicaid Other | Attending: Family Medicine | Admitting: Family Medicine

## 2015-06-20 ENCOUNTER — Emergency Department (HOSPITAL_COMMUNITY): Payer: Medicaid Other

## 2015-06-20 DIAGNOSIS — M1990 Unspecified osteoarthritis, unspecified site: Secondary | ICD-10-CM | POA: Diagnosis present

## 2015-06-20 DIAGNOSIS — L98499 Non-pressure chronic ulcer of skin of other sites with unspecified severity: Secondary | ICD-10-CM

## 2015-06-20 DIAGNOSIS — J449 Chronic obstructive pulmonary disease, unspecified: Secondary | ICD-10-CM | POA: Diagnosis present

## 2015-06-20 DIAGNOSIS — L02619 Cutaneous abscess of unspecified foot: Secondary | ICD-10-CM | POA: Diagnosis present

## 2015-06-20 DIAGNOSIS — Z681 Body mass index (BMI) 19 or less, adult: Secondary | ICD-10-CM | POA: Diagnosis not present

## 2015-06-20 DIAGNOSIS — E43 Unspecified severe protein-calorie malnutrition: Secondary | ICD-10-CM | POA: Insufficient documentation

## 2015-06-20 DIAGNOSIS — Z886 Allergy status to analgesic agent status: Secondary | ICD-10-CM

## 2015-06-20 DIAGNOSIS — G629 Polyneuropathy, unspecified: Secondary | ICD-10-CM | POA: Diagnosis present

## 2015-06-20 DIAGNOSIS — Z79891 Long term (current) use of opiate analgesic: Secondary | ICD-10-CM

## 2015-06-20 DIAGNOSIS — Z79899 Other long term (current) drug therapy: Secondary | ICD-10-CM | POA: Diagnosis not present

## 2015-06-20 DIAGNOSIS — A419 Sepsis, unspecified organism: Secondary | ICD-10-CM | POA: Diagnosis not present

## 2015-06-20 DIAGNOSIS — Z981 Arthrodesis status: Secondary | ICD-10-CM

## 2015-06-20 DIAGNOSIS — M545 Low back pain, unspecified: Secondary | ICD-10-CM

## 2015-06-20 DIAGNOSIS — F1110 Opioid abuse, uncomplicated: Secondary | ICD-10-CM

## 2015-06-20 DIAGNOSIS — E440 Moderate protein-calorie malnutrition: Secondary | ICD-10-CM | POA: Diagnosis present

## 2015-06-20 DIAGNOSIS — F419 Anxiety disorder, unspecified: Secondary | ICD-10-CM | POA: Diagnosis present

## 2015-06-20 DIAGNOSIS — I272 Other secondary pulmonary hypertension: Secondary | ICD-10-CM | POA: Diagnosis present

## 2015-06-20 DIAGNOSIS — F1190 Opioid use, unspecified, uncomplicated: Secondary | ICD-10-CM | POA: Diagnosis present

## 2015-06-20 DIAGNOSIS — L97529 Non-pressure chronic ulcer of other part of left foot with unspecified severity: Secondary | ICD-10-CM | POA: Diagnosis present

## 2015-06-20 DIAGNOSIS — L02419 Cutaneous abscess of limb, unspecified: Secondary | ICD-10-CM

## 2015-06-20 DIAGNOSIS — M869 Osteomyelitis, unspecified: Secondary | ICD-10-CM

## 2015-06-20 DIAGNOSIS — L03119 Cellulitis of unspecified part of limb: Secondary | ICD-10-CM

## 2015-06-20 DIAGNOSIS — I10 Essential (primary) hypertension: Secondary | ICD-10-CM | POA: Diagnosis present

## 2015-06-20 DIAGNOSIS — F17210 Nicotine dependence, cigarettes, uncomplicated: Secondary | ICD-10-CM | POA: Diagnosis present

## 2015-06-20 DIAGNOSIS — L0390 Cellulitis, unspecified: Secondary | ICD-10-CM | POA: Diagnosis present

## 2015-06-20 DIAGNOSIS — G8929 Other chronic pain: Secondary | ICD-10-CM | POA: Diagnosis present

## 2015-06-20 DIAGNOSIS — Z590 Homelessness: Secondary | ICD-10-CM

## 2015-06-20 DIAGNOSIS — M7989 Other specified soft tissue disorders: Secondary | ICD-10-CM | POA: Diagnosis not present

## 2015-06-20 LAB — I-STAT CHEM 8, ED
BUN: 9 mg/dL (ref 6–20)
CHLORIDE: 99 mmol/L — AB (ref 101–111)
CREATININE: 1 mg/dL (ref 0.61–1.24)
Calcium, Ion: 1.12 mmol/L (ref 1.12–1.23)
Glucose, Bld: 117 mg/dL — ABNORMAL HIGH (ref 65–99)
HCT: 32 % — ABNORMAL LOW (ref 39.0–52.0)
HEMOGLOBIN: 10.9 g/dL — AB (ref 13.0–17.0)
POTASSIUM: 3.5 mmol/L (ref 3.5–5.1)
Sodium: 137 mmol/L (ref 135–145)
TCO2: 24 mmol/L (ref 0–100)

## 2015-06-20 LAB — CBC WITH DIFFERENTIAL/PLATELET
BASOS ABS: 0 10*3/uL (ref 0.0–0.1)
Basophils Relative: 1 % (ref 0–1)
Eosinophils Absolute: 0.1 10*3/uL (ref 0.0–0.7)
Eosinophils Relative: 1 % (ref 0–5)
HCT: 31.2 % — ABNORMAL LOW (ref 39.0–52.0)
HEMOGLOBIN: 10.2 g/dL — AB (ref 13.0–17.0)
LYMPHS ABS: 1 10*3/uL (ref 0.7–4.0)
Lymphocytes Relative: 13 % (ref 12–46)
MCH: 25.4 pg — ABNORMAL LOW (ref 26.0–34.0)
MCHC: 32.7 g/dL (ref 30.0–36.0)
MCV: 77.6 fL — AB (ref 78.0–100.0)
Monocytes Absolute: 0.9 10*3/uL (ref 0.1–1.0)
Monocytes Relative: 12 % (ref 3–12)
NEUTROS PCT: 73 % (ref 43–77)
Neutro Abs: 5.6 10*3/uL (ref 1.7–7.7)
PLATELETS: 206 10*3/uL (ref 150–400)
RBC: 4.02 MIL/uL — ABNORMAL LOW (ref 4.22–5.81)
RDW: 15.4 % (ref 11.5–15.5)
WBC: 7.6 10*3/uL (ref 4.0–10.5)

## 2015-06-20 MED ORDER — LISINOPRIL 10 MG PO TABS
10.0000 mg | ORAL_TABLET | Freq: Every day | ORAL | Status: DC
  Administered 2015-06-20 – 2015-06-22 (×3): 10 mg via ORAL
  Filled 2015-06-20 (×3): qty 1

## 2015-06-20 MED ORDER — ACETAMINOPHEN 325 MG PO TABS: 650.0000 mg | ORAL_TABLET | Freq: Four times a day (QID) | ORAL | Status: DC | PRN

## 2015-06-20 MED ORDER — ENOXAPARIN SODIUM 40 MG/0.4ML ~~LOC~~ SOLN
40.0000 mg | SUBCUTANEOUS | Status: DC
Start: 2015-06-20 — End: 2015-06-22
  Administered 2015-06-20 – 2015-06-21 (×2): 40 mg via SUBCUTANEOUS
  Filled 2015-06-20 (×3): qty 0.4

## 2015-06-20 MED ORDER — SODIUM CHLORIDE 0.9 % IV SOLN
INTRAVENOUS | Status: DC
  Administered 2015-06-20: 08:00:00 via INTRAVENOUS

## 2015-06-20 MED ORDER — ONDANSETRON HCL 4 MG/2ML IJ SOLN: 4.0000 mg | Freq: Four times a day (QID) | INTRAMUSCULAR | Status: DC | PRN

## 2015-06-20 MED ORDER — ACETAMINOPHEN 650 MG RE SUPP: 650.0000 mg | Freq: Four times a day (QID) | RECTAL | Status: DC | PRN

## 2015-06-20 MED ORDER — TETANUS-DIPHTH-ACELL PERTUSSIS 5-2.5-18.5 LF-MCG/0.5 IM SUSP
0.5000 mL | Freq: Once | INTRAMUSCULAR | Status: AC
  Administered 2015-06-20: 0.5 mL via INTRAMUSCULAR
  Filled 2015-06-20: qty 0.5

## 2015-06-20 MED ORDER — PIPERACILLIN-TAZOBACTAM 3.375 G IVPB 30 MIN
3.3750 g | Freq: Once | INTRAVENOUS | Status: AC
  Administered 2015-06-20: 3.375 g via INTRAVENOUS
  Filled 2015-06-20: qty 50

## 2015-06-20 MED ORDER — VANCOMYCIN HCL IN DEXTROSE 1-5 GM/200ML-% IV SOLN
1000.0000 mg | Freq: Once | INTRAVENOUS | Status: AC
  Administered 2015-06-20: 1000 mg via INTRAVENOUS
  Filled 2015-06-20: qty 200

## 2015-06-20 MED ORDER — PIPERACILLIN-TAZOBACTAM 3.375 G IVPB
3.3750 g | Freq: Three times a day (TID) | INTRAVENOUS | Status: DC
  Administered 2015-06-20 – 2015-06-21 (×4): 3.375 g via INTRAVENOUS
  Filled 2015-06-20 (×5): qty 50

## 2015-06-20 MED ORDER — ENSURE ENLIVE PO LIQD
237.0000 mL | Freq: Two times a day (BID) | ORAL | Status: DC
  Administered 2015-06-20 – 2015-06-21 (×2): 237 mL via ORAL

## 2015-06-20 MED ORDER — ACETAMINOPHEN 500 MG PO TABS
1000.0000 mg | ORAL_TABLET | Freq: Once | ORAL | Status: AC
  Administered 2015-06-20: 1000 mg via ORAL
  Filled 2015-06-20: qty 2

## 2015-06-20 MED ORDER — OXYCODONE HCL 5 MG PO TABS
30.0000 mg | ORAL_TABLET | Freq: Four times a day (QID) | ORAL | Status: DC
  Administered 2015-06-20 – 2015-06-22 (×9): 30 mg via ORAL
  Filled 2015-06-20 (×9): qty 6

## 2015-06-20 MED ORDER — ALUM & MAG HYDROXIDE-SIMETH 200-200-20 MG/5ML PO SUSP: 30.0000 mL | Freq: Four times a day (QID) | ORAL | Status: DC | PRN

## 2015-06-20 MED ORDER — ONDANSETRON HCL 4 MG PO TABS
4.0000 mg | ORAL_TABLET | Freq: Four times a day (QID) | ORAL | Status: DC | PRN
Start: 2015-06-20 — End: 2015-06-22

## 2015-06-20 MED ORDER — VANCOMYCIN HCL IN DEXTROSE 750-5 MG/150ML-% IV SOLN
750.0000 mg | Freq: Two times a day (BID) | INTRAVENOUS | Status: DC
  Administered 2015-06-20 – 2015-06-21 (×2): 750 mg via INTRAVENOUS
  Filled 2015-06-20 (×3): qty 150

## 2015-06-20 MED ORDER — NICOTINE 21 MG/24HR TD PT24
21.0000 mg | MEDICATED_PATCH | Freq: Every day | TRANSDERMAL | Status: DC
  Administered 2015-06-20 – 2015-06-22 (×3): 21 mg via TRANSDERMAL
  Filled 2015-06-20 (×3): qty 1

## 2015-06-20 MED ORDER — LORAZEPAM 0.5 MG PO TABS
0.2500 mg | ORAL_TABLET | Freq: Three times a day (TID) | ORAL | Status: DC | PRN
  Administered 2015-06-20 – 2015-06-21 (×2): 0.25 mg via ORAL
  Filled 2015-06-20 (×3): qty 1
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
## 2015-06-20 NOTE — ED Notes (Signed)
Patient is a homeless [place] with c/o leg pain and swelling.  Pt reported that he has been having left leg swelling "to the point that he can not walk today".  Pt reports that symptoms have been bothering him for 3 days; pt also has ulcer to left heel which is chronic---- been there for 5 months.

## 2015-06-20 NOTE — Progress Notes (Signed)
Utilization review completed.  

## 2015-06-20 NOTE — Progress Notes (Signed)
ANTIBIOTIC CONSULT NOTE - INITIAL  Pharmacy Consult for vancomycin/Zosyn  Indication: Cellulitis of foot/possible osteomyelitis  Allergies  Allergen Reactions  . Motrin [Ibuprofen] Nausea Only    Patient Measurements: Weight: 133 lb (60.328 kg) Adjusted Body Weight:    Vital Signs: Temp: 98.6 F (37 C) (08/07 0644) Temp Source: Oral (08/07 0644) BP: 120/77 mmHg (08/07 0644) Pulse Rate: 66 (08/07 0644) Intake/Output from previous day:   Intake/Output from this shift:    Labs:  Recent Labs  06/20/15 0305 06/20/15 0309  WBC 7.6  --   HGB 10.2* 10.9*  PLT 206  --   CREATININE  --  1.00   Estimated Creatinine Clearance: 72 mL/min (by C-G formula based on Cr of 1). No results for input(s): VANCOTROUGH, VANCOPEAK, VANCORANDOM, GENTTROUGH, GENTPEAK, GENTRANDOM, TOBRATROUGH, TOBRAPEAK, TOBRARND, AMIKACINPEAK, AMIKACINTROU, AMIKACIN in the last 72 hours.   Microbiology: No results found for this or any previous visit (from the past 720 hour(s)).  Medical History: Past Medical History  Diagnosis Date  . Pneumonia 08/24/2014  . Hypertension   . Anxiety   . Arthritis     "feet" (08/24/2014)  . Chronic lower back pain     "most of the time; sometimes it goes up into my upper back" (08/24/2014)  . Neuropathy   . Heroin use     Medications:  Scheduled:  . enoxaparin (LOVENOX) injection  40 mg Subcutaneous Q24H  . lisinopril  10 mg Oral Daily  . oxycodone  30 mg Oral QID  . piperacillin-tazobactam (ZOSYN)  IV  3.375 g Intravenous Q8H  . vancomycin  750 mg Intravenous Q12H   Assessment: Pt is a 54 yo male with cellulitis/abcess of heel that started five months ago for which the patient was self-treating with peroxide. Pt also a hx of heroin use. Pt to undergo MRI to rule out osteomyelitis.     PLAN:  Received vancomycin 1000 mg IV x1 in ER on 8/7, will start vancomycin 750 mg IV q12h. Received Zosyn 3.375 gr IV x1 in ER, will start Zosyn 3.375 gr IV q8h EI.  BMP  is AM.   Goal of Therapy:  Vancomycin trough level 15-20 mcg/ml  Plan:  Follow up culture results  Follow up MRI results   Adalberto Cole PharmD, BCPS 06/20/15, 7:49

## 2015-06-20 NOTE — ED Notes (Signed)
Bed: ZO10 Expected date:  Expected time:  Means of arrival:  Comments: EMS Pitting edema

## 2015-06-20 NOTE — Progress Notes (Signed)
Ethan [name] VWU:[id number] DOB: January 08, 1961 DOA: 06/20/2015 PCP: Pcp Not In System  Brief narrative: 54 y/o ? 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 Dutch Quint Admission 08/29/14 R multifocal PNA Tob use/ COPD pulm htn on echo 08/2014-EF 65% c Diastolic dysfunction  homelessness state History of fall off 100 foot bridge 5 years prior to admission-broke all of the bones in his body and has been in restrooms and nursing homes since that time No family Lives on the street   Admitted after being found in parking lot of [place]  Past medical history-As per Problem list Chart reviewed as below-   Consultants:    Procedures:    Antibiotics:     Subjective  Alert pleasant oriented no apparent distress Has been dressing with napkins from a restaurant for the past 4 months Area has actually started to improve Got worse recently over the past 1-2 weeks--started to have purulent drainage Has never been treated with antibiotics for this Last emergency room visit per patient has been 1-1-1/2 month ago and the area looked clean   Objective    Interim History:   Telemetry: Normal sinus rhythm   Objective: Filed Vitals:   06/20/15 0149 06/20/15 0406 06/20/15 0644 06/20/15 0731  BP: 127/74 124/76 120/77   Pulse: 80 81 66   Temp: 98.5 F (36.9 C) 98.5 F (36.9 C) 98.6 F (37 C)   TempSrc: Oral Oral Oral   Resp: Weight:    60.328 kg (133 lb)  SpO2: 100% 99% 98%    No intake or output data in the 24 hours ending 06/20/15 0807  Exam:  General: EOMI NCAT, flat affect Cardiovascular: S1-S2 no murmur rub or gallop Respiratory: Clinically clear no added sound Abdomen: Soft nontender nondistended no rebound or guarding Skin no lower extremity edema on the right side, left side is significantly swollen and erythematous-I did not examine the foot ulcer as it has been wrapped this morning Neuro intact  Data Reviewed: Basic Metabolic  Panel:  Recent Labs Lab 06/20/15 0309  NA 137  K 3.5  CL 99*  GLUCOSE 117*  BUN 9  CREATININE 1.00   Liver Function Tests: No results for input(s): AST, ALT, ALKPHOS, BILITOT, PROT, ALBUMIN in the last 168 hours. No results for input(s): LIPASE, AMYLASE in the last 168 hours. No results for input(s): AMMONIA in the last 168 hours. CBC:  Recent Labs Lab 06/20/15 0305 06/20/15 0309  WBC 7.6  --   NEUTROABS 5.6  --   HGB 10.2* 10.9*  HCT 31.2* 32.0*  MCV 77.6*  --   PLT 206  --    Cardiac Enzymes: No results for input(s): CKTOTAL, CKMB, CKMBINDEX, TROPONINI in the last 168 hours. BNP: Invalid input(s): POCBNP CBG: No results for input(s): GLUCAP in the last 168 hours.  No results found for this or any previous visit (from the past 240 hour(s)).   Studies:              All Imaging reviewed and is as per above notation   Scheduled Meds: . enoxaparin (LOVENOX) injection  40 mg Subcutaneous Q24H  . feeding supplement (ENSURE ENLIVE)  237 mL Oral BID BM  . lisinopril  10 mg Oral Daily  . oxycodone  30 mg Oral QID  . piperacillin-tazobactam (ZOSYN)  IV  3.375 g Intravenous Q8H  . vancomycin  750 mg Intravenous Q12H   Continuous Infusions: . sodium chloride 75 mL/hr at 06/20/15  0800     Assessment/Plan:  Principal Problem:   Cellulitis and abscess of foot Active Problems:   Foot ulcer, left   Chronic lower back pain   Hypertension   Heroin use  Agree with plan of care as per Dr. Loyal Jacobson spectrum vancomycin/Zosyn for now As not a diabetic would not treat as a polymicrobial infection Consider x-ray/plain film to rule out osteomyelitis if no better Area to be marked out by nursing was aware Repeat CBC plus differential in a.m. Social work needs to see the patient as an outpatient to give resources-patient has no family members nor other resources [place]   Disposition Plan: ? DVT prophylaxis: SCD Consultants:   Pleas Koch,  MD  [facility] Pager [phone] 06/20/2015, 8:07 AM    LOS: 0 days

## 2015-06-20 NOTE — ED Provider Notes (Signed)
CSN: [id number]     Arrival date & time 06/20/15  0144 History  This chart was scribed for Vanesha Athens, MD by Doreatha Martin, ED Scribe. This patient was seen in room WA12/WA12 and the patient's care was started at 2:30 AM.     Chief Complaint  Patient presents with  . Leg Swelling   Patient is a 54 y.o. male presenting with leg pain. The history is provided by the patient. No language interpreter was used.  Leg Pain Location:  Foot and leg Time since incident:  4 months Leg location:  L leg Foot location:  L foot Pain details:    Quality:  Aching   Radiates to:  Does not radiate   Severity:  Moderate   Onset quality:  Gradual   Timing:  Constant   Progression:  Worsening Chronicity:  Chronic Foreign body present:  No foreign bodies Prior injury to area:  Yes Relieved by:  Nothing Worsened by:  Bearing weight and activity Ineffective treatments:  None tried Associated symptoms: swelling   Associated symptoms: no numbness   Risk factors: no concern for non-accidental trauma     HPI Comments: Ethan [name] is a 54 y.o. male BIBA with Hx of homelessness, HTN, chronic lower back pain, heroin use, neuropathy who presents to the Emergency Department complaining of moderate, gradual onset left leg swelling onset 4 days ago. Pt states associated left leg pain. He states 3 prior episodes of similar symptoms. Pt notes that he walks around all day. He denies antibiotic use for this issue.  Past Medical History  Diagnosis Date  . Pneumonia 08/24/2014  . Hypertension   . Anxiety   . Arthritis     "feet" (08/24/2014)  . Chronic lower back pain     "most of the time; sometimes it goes up into my upper back" (08/24/2014)  . Neuropathy   . Heroin use    Past Surgical History  Procedure Laterality Date  . Back surgery      JUMPED OFF A BRIDGE 2013  . Leg surgery      PINS/ PLATES  . Abdominal surgery    . Hernia repair    . Posterior lumbar fusion  2013    "got a broken rod and  screw in there now" (08/24/2014)   History reviewed. No pertinent family history. History  Substance Use Topics  . Smoking status: Current Every Day Smoker -- 1.50 packs/day for 45 years    Types: Cigarettes  . Smokeless tobacco: Never Used  . Alcohol Use: Yes     Comment: "08/24/2014 "1-3 drinks of  alcohol rarely"    Review of Systems  Cardiovascular: Positive for leg swelling.  Skin: Positive for color change and wound.  All other systems reviewed and are negative.  Allergies  Motrin  Home Medications   Prior to Admission medications   Medication Sig Start Date End Date Taking? Authorizing Provider  acetaminophen (TYLENOL) 500 MG tablet Take 1 tablet (500 mg total) by mouth every 6 (six) hours as needed. Patient taking differently: Take 500 mg by mouth every 6 (six) hours as needed for mild pain.  04/18/15   Junius Finner, PA-C  furosemide (LASIX) 20 MG tablet Take 1 tablet (20 mg total) by mouth daily. 05/01/15   Bethann Berkshire, MD  lisinopril (PRINIVIL,ZESTRIL) 10 MG tablet Take 10 mg by mouth daily.    Historical Provider, MD  LISINOPRIL PO Take 1 tablet by mouth daily.    Historical Provider, MD  [facility] HospitalDorris Fetch18 Gurneyresearch o [email or web address] [phone] FurbishLeonia CoronaAldona Lento lliot GurneyClinical research associateVictory DakinLeone Payor31mBlythedale Children'S HospitalDorris Fetch28 rita [phone] FurbishLeonia CoronaAldona Lento Elliot GurneyClinical research associateVictory DakinLeone Payor41mElliot 1 Day Surgery CenterDorris Fetch49 Brita [phone] FurbishLeonia CoronaAldona Lento Elliot GurneyClinical research associateVictory DakinLeone Payor59mBoyton Beach Ambulatory Surgery CenterDorris Fetch68 Brita [phone] FurbishLeonia CoronaAldona Lento Elliot GurneyClinical research associateVictory DakinLeone Payor21mLafayette Behavioral Health UnitDorris Fetch75 Brita [phone] FurbishLeonia CoronaAldona Lento Elliot GurneyClinical research associateVictory DakinLeone Payor77mBeaumont Hospital TroyDorris Fetch35 Brita Romp  TCO2 24 0 - 100 mmol/L   Hemoglobin 10.9 (L) 13.0 - 17.0 g/dL   HCT 40.9 (L) 81.1 - 91.4 %   Dg Ankle Complete Left  06/20/2015   CLINICAL DATA:  LEFT leg pain and swelling, unable to ambulate. History of chronic heel ulcer.  EXAM: LEFT ANKLE COMPLETE - 3+ VIEW  COMPARISON:  LEFT ankle radiograph May 04, 2015  FINDINGS: Chronic he healed displaced distal tibia and fibular fractures. Chronic, healed talocalcaneal fractures. Ankle mortise appears congruent. No acute fracture deformity. No dislocation. Osteopenia. Small amount of apparent periosteal reaction associated with cortical irregularity of the the plantar aspect of the calcaneus associated with soft tissue defect/ulceration. No subcutaneous gas or radiopaque foreign bodies.  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  Old hindfoot and ankle fractures. No acute fracture deformity or dislocation.   Electronically Signed   By: Awilda Metro M.D.   On: 06/20/2015 03:15   Dg Foot Complete Left  06/20/2015   CLINICAL DATA:  Left foot pain and swelling, acute in nature. Chronic heel ulceration. Initial encounter.  EXAM: LEFT FOOT - COMPLETE 3+ VIEW  COMPARISON:  Left foot radiographs performed 05/04/2015  FINDINGS: There is no evidence of fracture or dislocation. There is marked chronic deformity involving the distal tibia and fibula, and the calcaneus. There is diffuse osteopenia of visualized osseous structures. No definite osseous erosion is seen to suggest osteomyelitis, though evaluation for osteomyelitis is limited on radiograph.  The joint spaces are grossly unchanged from the prior study.  A soft tissue ulceration is noted at the heel.  IMPRESSION: 1. No evidence of fracture or dislocation.  2. Marked chronic deformity involving the distal tibia and fibula, and the calcaneus. No definite osseous erosion seen to suggest osteomyelitis, though evaluation for osteomyelitis is limited on radiograph. 3. Diffuse osteopenia of visualized osseous structures. 4. Soft tissue ulceration at the heel.   Electronically Signed   By: Roanna Raider M.D.   On: 06/20/2015 03:10    Medications  vancomycin (VANCOCIN) IVPB 1000 mg/200 mL premix (1,000 mg Intravenous New Bag/Given 06/20/15 0517)  Tdap (BOOSTRIX) injection 0.5 mL (0.5 mLs Intramuscular Given 06/20/15 0311)  acetaminophen (TYLENOL) tablet 1,000 mg (1,000 mg Oral Given 06/20/15 0310)  piperacillin-tazobactam (ZOSYN) IVPB 3.375 g (0 g Intravenous Stopped 06/20/15 0517)    I personally performed the services described in this documentation, which was scribed in my presence. The recorded information has been reviewed and is accurate.    Cy Blamer, MD 06/20/15 [phone]

## 2015-06-21 DIAGNOSIS — E43 Unspecified severe protein-calorie malnutrition: Secondary | ICD-10-CM | POA: Insufficient documentation

## 2015-06-21 LAB — COMPREHENSIVE METABOLIC PANEL
ALT: 45 U/L (ref 17–63)
ANION GAP: 3 — AB (ref 5–15)
AST: 44 U/L — ABNORMAL HIGH (ref 15–41)
Albumin: 2.2 g/dL — ABNORMAL LOW (ref 3.5–5.0)
Alkaline Phosphatase: 137 U/L — ABNORMAL HIGH (ref 38–126)
BUN: 11 mg/dL (ref 6–20)
CO2: 30 mmol/L (ref 22–32)
Calcium: 8.5 mg/dL — ABNORMAL LOW (ref 8.9–10.3)
Chloride: 100 mmol/L — ABNORMAL LOW (ref 101–111)
Creatinine, Ser: 0.73 mg/dL (ref 0.61–1.24)
GLUCOSE: 89 mg/dL (ref 65–99)
Potassium: 4.4 mmol/L (ref 3.5–5.1)
SODIUM: 133 mmol/L — AB (ref 135–145)
TOTAL PROTEIN: 6.6 g/dL (ref 6.5–8.1)
Total Bilirubin: 0.6 mg/dL (ref 0.3–1.2)

## 2015-06-21 LAB — CBC
HEMATOCRIT: 32.9 % — AB (ref 39.0–52.0)
HEMOGLOBIN: 10.3 g/dL — AB (ref 13.0–17.0)
MCH: 24.6 pg — AB (ref 26.0–34.0)
MCHC: 31.3 g/dL (ref 30.0–36.0)
MCV: 78.7 fL (ref 78.0–100.0)
Platelets: 203 10*3/uL (ref 150–400)
RBC: 4.18 MIL/uL — ABNORMAL LOW (ref 4.22–5.81)
RDW: 15.3 % (ref 11.5–15.5)
WBC: 4 10*3/uL (ref 4.0–10.5)

## 2015-06-21 MED ORDER — OXYCODONE HCL 5 MG PO TABS: 30.0000 mg | ORAL_TABLET | Freq: Every day | ORAL | Status: DC

## 2015-06-21 MED ORDER — SILVER SULFADIAZINE 1 % EX CREA
TOPICAL_CREAM | Freq: Two times a day (BID) | CUTANEOUS | Status: DC
  Administered 2015-06-21 (×2): via TOPICAL
  Administered 2015-06-22: 1 via TOPICAL
  Filled 2015-06-21: qty 85

## 2015-06-21 MED ORDER — ENSURE ENLIVE PO LIQD
237.0000 mL | Freq: Three times a day (TID) | ORAL | Status: DC
  Administered 2015-06-21 – 2015-06-22 (×4): 237 mL via ORAL

## 2015-06-21 MED ORDER — CLINDAMYCIN HCL 300 MG PO CAPS
300.0000 mg | ORAL_CAPSULE | Freq: Three times a day (TID) | ORAL | Status: DC
  Administered 2015-06-21 – 2015-06-22 (×4): 300 mg via ORAL
  Filled 2015-06-21 (×6): qty 1

## 2015-06-21 MED ORDER — LORAZEPAM 2 MG/ML IJ SOLN
0.5000 mg | Freq: Every day | INTRAMUSCULAR | Status: DC
  Administered 2015-06-21: 0.5 mg via INTRAMUSCULAR
  Filled 2015-06-21: qty 1

## 2015-06-21 NOTE — Consult Note (Signed)
WOC wound consult note Reason for Consult: Wound on left plantar heel-full thickness. Patient with neuropathy from extensive injuries sustained 5 years ago in a 149ft fall-fractured ankles, arms, pelvis. He is currently homeless and has been caring for his ulcer with bottled water and napkins from fast food establishments-typically applying a "dressing"  Three to six times a day. Wound type: neuropathic, not pressure injury Pressure Ulcer POA: No Measurement: 1cm x 2cm with 1cm deep area in a sickle-shaped defect from 5-7 o'clock (indicative of infection) Wound bed:red, moist and free of necrotic tissue. Drainage (amount, consistency, odor) small amount of light yellow exudate, no odor Periwound: Intact, dry Dressing procedure/placement/frequency: I will implement a twice daily silver sulfadiazine dressing in hopes that he will be able to take the remainder of the tube with him upon discharge and continue therapy. Orders provided for Nursing. Thank you for this consultation. The WOC nursing team will not follow, but will remain available to this patient, the nursing and medical teams  Please re-consult if needed. Thanks, Ladona Mow, MSN, RN, GNP, [place], CWON-AP [phone])

## 2015-06-21 NOTE — Progress Notes (Signed)
Initial Nutrition Assessment  DOCUMENTATION CODES:   Non-severe (moderate) malnutrition in context of social or environmental circumstances  INTERVENTION:  - Will increase Ensure Enlive to TID per pt request - RD will continue to monitor for needs  NUTRITION DIAGNOSIS:   Malnutrition related to social / environmental circumstances as evidenced by moderate depletions of muscle mass, moderate to severe fluid accumulation, moderate depletion of body fat.   GOAL:   Patient will meet greater than or equal to 90% of their needs  MONITOR:   PO intake, Supplement acceptance, Weight trends, Labs  REASON FOR ASSESSMENT:   Malnutrition Screening Tool  ASSESSMENT:   54 y.o. male with a history of Heroin Use, and HTN who presents to the ED with complaints of increased pain and swelling of his Left foot extending into the left leg over the past 2-3 days, and difficulty walking the last 24 hours due to the pain. He reports that he developed a wound on his left heel 5 months ago and has been caring for it by cleansing with Peroxide 4 times a day and wetting paper napkins to dress the wound. He denies any fevers or chills. He was evaluated in the ED and X-rays of the left foot were performed. Patient was placed on IV Vancomycin and Zosyn and referred for admission.  Pt seen for MST. BMI is borderline normal weight status and underweight. Pt has eaten 100% of all meals since admission. He indicates good appetite and no abdominal pain or nausea with intakes.  Pt drank an Ensure yesterday and liked it. He requests Ensure order be increased to TID because he would like to gain weight during admission. He states over the past 3-4 months he has lost ~20 lbs. Per weight hx review, pt has lost 27 lbs (17% body weight) in the past 4 months which is significant for time frame.  Moderate muscle and fat wasting noted. Meeting needs currently but likely unable to PTA due to homelessness. Medications  reviewed. Labs reviewed; Na: 133 mmol/l, Cl: 100 mmol/l, Ca: 8.5 mg/dL.  Diet Order:  Diet Heart Room service appropriate?: Yes; Fluid consistency:: Thin  Skin:  Wound (see comment) (L heel ulcer)  Last BM:  PTA  Height:   Ht Readings from Last 1 Encounters:  03/07/15  (1.803 m)    Weight:   Wt Readings from Last 1 Encounters:  06/20/15 133 lb (60.328 kg)    Ideal Body Weight:     BMI:  Body mass index is 18.56 kg/(m^2).  Estimated Nutritional Needs:   Kcal:  1600-1800  Protein:  72-85 grams  Fluid:  2.2 L/day  EDUCATION NEEDS:   No education needs identified at this time     Trenton Gammon, RD, LDN Inpatient Clinical Dietitian Pager # [phone] After hours/weekend pager # [phone]

## 2015-06-21 NOTE — Clinical Documentation Improvement (Signed)
  Please specify the medical condition related to the below findings if any:  Malnutrition- Mild Malnutrition- Moderate Malnutrition- Severe Other______ Unable to suspect or determine  H&P:  "Pleasant, thin 54 yo Caucasian male." BMI= 18.56 Hx homelessness  Thank you!  Sharyn Creamer, BSN, RN [place] HIM/Clinical Documentation Specialist [email or web address] [phone]

## 2015-06-21 NOTE — Clinical Documentation Improvement (Signed)
  Please specify the medical condition if any related to the below findings:   Anemia due to acute blood loss Anemia due to chronic blood loss Anemia due to iron deficiency Anemia due to chronic disease Chronic anemia  06/20/15:  Hgb= 10.2; Hct= 31.2  Thank you!  Sharyn Creamer, BSN, RN [place] HIM/Clinical Documentation Specialist [email or web address] [phone]

## 2015-06-21 NOTE — Progress Notes (Signed)
Ethan [name] AOZ:[id number] DOB: [birth year] DOA: 06/20/2015 PCP: Pcp Not In System  Brief narrative: 54 y/o ? 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 Dutch Quint Admission 08/29/14 R multifocal PNA Tob use/ COPD pulm htn on echo 08/2014-EF 65% c Diastolic dysfunction  homelessness state History of fall off 100 foot bridge 5 years prior to admission-broke all of the bones in his body and has been in restrooms and nursing homes since that time No family Lives on the street   Admitted after being found in parking lot of [place]  Past medical history-As per Problem list Chart reviewed as below-   Consultants:    Procedures:    Antibiotics:     Subjective   Better Asking for pain meds as well as for anxiolytics No other issues   Objective    Interim History:   Telemetry: Normal sinus rhythm   Objective: Filed Vitals:   06/20/15 2112 06/21/15 0246 06/21/15 0541 06/21/15 1003  BP: 119/73 134/78 137/84 144/87  Pulse: 79 66 71 87  Temp: 98.5 F (36.9 C) 98.3 F (36.8 C) 98.4 F (36.9 C) 98.3 F (36.8 C)  TempSrc: Oral Oral Oral Oral  Resp: 16 18 16 18   Weight:      SpO2: 100% 100% 100% 100%    Intake/Output Summary (Last 24 hours) at 06/21/15 1024 Last data filed at 06/21/15 0535  Gross per 24 hour  Intake   1755 ml  Output      2 ml  Net   1753 ml    Exam:  General: EOMI NCAT, flat affect Cardiovascular: S1-S2 no murmur rub or gallop Respiratory: Clinically clear no added sound Abdomen: Soft nontender nondistended no rebound or guarding Skin RLE Neuro intact  Data Reviewed: Basic Metabolic Panel:  Recent Labs Lab 06/20/15 0309 06/21/15 0515  NA 137 133*  K 3.5 4.4  CL 99* 100*  CO2  --  30  GLUCOSE 117* 89  BUN 9 11  CREATININE 1.00 0.73  CALCIUM  --  8.5*   Liver Function Tests:  Recent Labs Lab 06/21/15 0515  AST 44*  ALT 45  ALKPHOS 137*  BILITOT 0.6  PROT 6.6  ALBUMIN 2.2*   No results for  input(s): LIPASE, AMYLASE in the last 168 hours. No results for input(s): AMMONIA in the last 168 hours. CBC:  Recent Labs Lab 06/20/15 0305 06/20/15 0309 06/21/15 0515  WBC 7.6  --  4.0  NEUTROABS 5.6  --   --   HGB 10.2* 10.9* 10.3*  HCT 31.2* 32.0* 32.9*  MCV 77.6*  --  78.7  PLT 206  --  203   Cardiac Enzymes: No results for input(s): CKTOTAL, CKMB, CKMBINDEX, TROPONINI in the last 168 hours. BNP: Invalid input(s): POCBNP CBG: No results for input(s): GLUCAP in the last 168 hours.  No results found for this or any previous visit (from the past 240 hour(s)).   Studies:              All Imaging reviewed and is as per above notation   Scheduled Meds: . enoxaparin (LOVENOX) injection  40 mg Subcutaneous Q24H  . feeding supplement (ENSURE ENLIVE)  237 mL Oral BID BM  . lisinopril  10 mg Oral Daily  . nicotine  21 mg Transdermal Daily  . oxycodone  30 mg Oral QID  . piperacillin-tazobactam (ZOSYN)  IV  3.375 g Intravenous Q8H  . vancomycin  750 mg Intravenous Q12H   Continuous Infusions: .  sodium chloride 75 mL/hr at 06/21/15 0535     Assessment/Plan:     Cellulitis and abscess of foot Vanc/Zosyn --->PO CLindamycin 300 q8 Aquacel dressing per WOC nurse Follows c Dr. Levada Schilling need close f/u c him    Foot ulcer, left -chronic Healed to appearance to me    Chronic lower back pain need Dr. Jeannetta Nap to refill meds NO NEW NARCOTIC RX TO BE GICEN ON D/C     Hypertension -continue lisinopril 10 daily    Heroin use -quiescent   Disposition Plan: ? DVT prophylaxis: SCD Consultants:   Pleas Koch, MD  [facility] Pager [phone] 06/21/2015, 10:24 AM    LOS: 1 day

## 2015-06-22 MED ORDER — CLINDAMYCIN HCL 300 MG PO CAPS: 300.0000 mg | ORAL_CAPSULE | Freq: Three times a day (TID) | ORAL | Status: DC

## 2015-06-22 MED ORDER — SILVER SULFADIAZINE 1 % EX CREA: TOPICAL_CREAM | Freq: Two times a day (BID) | CUTANEOUS | Status: DC

## 2015-06-22 MED ORDER — NICOTINE 21 MG/24HR TD PT24: 21.0000 mg | MEDICATED_PATCH | Freq: Every day | TRANSDERMAL | Status: DC
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
## 2015-06-25 LAB — CULTURE, BLOOD (ROUTINE X 2)
CULTURE: NO GROWTH
CULTURE: NO GROWTH

## 2015-06-28 ENCOUNTER — Emergency Department (HOSPITAL_COMMUNITY)
Admission: EM | Admit: 2015-06-28 | Discharge: 2015-06-29 | Disposition: A | Discharge status: Home / Self Care | Payer: Medicaid Other | Attending: Emergency Medicine | Admitting: Emergency Medicine

## 2015-06-28 ENCOUNTER — Emergency Department (HOSPITAL_BASED_OUTPATIENT_CLINIC_OR_DEPARTMENT_OTHER): Admit: 2015-06-28 | Discharge: 2015-06-28 | Disposition: A | Discharge status: Home / Self Care | Payer: Medicaid Other

## 2015-06-28 DIAGNOSIS — R5383 Other fatigue: Secondary | ICD-10-CM | POA: Diagnosis present

## 2015-06-28 DIAGNOSIS — M79606 Pain in leg, unspecified: Secondary | ICD-10-CM

## 2015-06-28 DIAGNOSIS — Z8739 Personal history of other diseases of the musculoskeletal system and connective tissue: Secondary | ICD-10-CM | POA: Insufficient documentation

## 2015-06-28 DIAGNOSIS — G8929 Other chronic pain: Secondary | ICD-10-CM | POA: Diagnosis not present

## 2015-06-28 DIAGNOSIS — Z8701 Personal history of pneumonia (recurrent): Secondary | ICD-10-CM | POA: Diagnosis not present

## 2015-06-28 DIAGNOSIS — R400 Somnolence: Secondary | ICD-10-CM

## 2015-06-28 DIAGNOSIS — Z8659 Personal history of other mental and behavioral disorders: Secondary | ICD-10-CM | POA: Insufficient documentation

## 2015-06-28 DIAGNOSIS — Z720 Tobacco use: Secondary | ICD-10-CM | POA: Insufficient documentation

## 2015-06-28 DIAGNOSIS — Z79899 Other long term (current) drug therapy: Secondary | ICD-10-CM | POA: Insufficient documentation

## 2015-06-28 DIAGNOSIS — I10 Essential (primary) hypertension: Secondary | ICD-10-CM | POA: Insufficient documentation

## 2015-06-28 DIAGNOSIS — R2242 Localized swelling, mass and lump, left lower limb: Secondary | ICD-10-CM | POA: Insufficient documentation

## 2015-06-28 DIAGNOSIS — M7989 Other specified soft tissue disorders: Secondary | ICD-10-CM

## 2015-06-28 LAB — CBC WITH DIFFERENTIAL/PLATELET
Basophils Absolute: 0 10*3/uL (ref 0.0–0.1)
Basophils Relative: 0 % (ref 0–1)
Eosinophils Absolute: 0.2 10*3/uL (ref 0.0–0.7)
Eosinophils Relative: 3 % (ref 0–5)
HCT: 32.7 % — ABNORMAL LOW (ref 39.0–52.0)
HEMOGLOBIN: 10.4 g/dL — AB (ref 13.0–17.0)
Lymphocytes Relative: 15 % (ref 12–46)
Lymphs Abs: 0.8 10*3/uL (ref 0.7–4.0)
MCH: 25.5 pg — ABNORMAL LOW (ref 26.0–34.0)
MCHC: 31.8 g/dL (ref 30.0–36.0)
MCV: 80.1 fL (ref 78.0–100.0)
MONO ABS: 0.5 10*3/uL (ref 0.1–1.0)
MONOS PCT: 9 % (ref 3–12)
NEUTROS ABS: 3.9 10*3/uL (ref 1.7–7.7)
NEUTROS PCT: 73 % (ref 43–77)
Platelets: 222 10*3/uL (ref 150–400)
RBC: 4.08 MIL/uL — ABNORMAL LOW (ref 4.22–5.81)
RDW: 15.6 % — AB (ref 11.5–15.5)
WBC: 5.4 10*3/uL (ref 4.0–10.5)

## 2015-06-28 LAB — I-STAT CG4 LACTIC ACID, ED
LACTIC ACID, VENOUS: 0.81 mmol/L (ref 0.5–2.0)
Lactic Acid, Venous: 0.44 mmol/L — ABNORMAL LOW (ref 0.5–2.0)

## 2015-06-28 LAB — BASIC METABOLIC PANEL
Anion gap: 7 (ref 5–15)
BUN: 12 mg/dL (ref 6–20)
CO2: 30 mmol/L (ref 22–32)
CREATININE: 0.72 mg/dL (ref 0.61–1.24)
Calcium: 8.4 mg/dL — ABNORMAL LOW (ref 8.9–10.3)
Chloride: 101 mmol/L (ref 101–111)
GFR calc non Af Amer: >60 mL/min (ref >60)
Glucose, Bld: 99 mg/dL (ref 65–99)
POTASSIUM: 3.5 mmol/L (ref 3.5–5.1)
Sodium: 138 mmol/L (ref 135–145)

## 2015-06-28 LAB — CBG MONITORING, ED: GLUCOSE-CAPILLARY: 98 mg/dL (ref 65–99)

## 2015-06-28 LAB — SALICYLATE LEVEL

## 2015-06-28 LAB — ETHANOL

## 2015-06-28 MED ORDER — ACETAMINOPHEN 325 MG PO TABS
650.0000 mg | ORAL_TABLET | Freq: Once | ORAL | Status: DC
  Filled 2015-06-28: qty 2

## 2015-06-28 MED ORDER — ACETAMINOPHEN 650 MG RE SUPP
650.0000 mg | Freq: Once | RECTAL | Status: AC
  Administered 2015-06-28: 650 mg via RECTAL
  Filled 2015-06-28: qty 1

## 2015-06-28 NOTE — Progress Notes (Signed)
VASCULAR LAB PRELIMINARY  PRELIMINARY  PRELIMINARY  PRELIMINARY  Bilateral lower extremity venous duplex completed.    Preliminary report:  There is no obvious evidence of DVT or SVT noted in the visualized veins of the bilateral lower extremities.   Tishia Maestre, RVT 06/28/2015, 7:45 PM

## 2015-06-28 NOTE — ED Notes (Signed)
Bed: WA14 Expected date:  Expected time:  Means of arrival:  Comments: RES B 

## 2015-06-28 NOTE — ED Notes (Signed)
Attempted to give PO Tylenol but pt will not stay awake; deferred administration until pt is more alert.

## 2015-06-28 NOTE — Discharge Instructions (Signed)
Continue to take antibiotics as prescribed. Return without fail for worsening symptoms, including fever, vomiting unable to keep down food or fluids, worsening swelling or pain in her legs, or any other symptoms concerning to you.
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
## 2015-06-28 NOTE — ED Notes (Signed)
Bed: RESB Expected date:  Expected time:  Means of arrival:  Comments: Hold 

## 2015-06-29 ENCOUNTER — Encounter (HOSPITAL_COMMUNITY): Payer: Self-pay | Admitting: Emergency Medicine

## 2015-06-29 NOTE — ED Notes (Addendum)
Nurse was trying to take patient iv because patient was discharged. Patient was in his belongings trying to get something. Patient wanted a few minutes before the IV was taken out. Patient started cussing and using obscene words. Patient would not let the nurse take the IV out. The patient finally let the nurse take the IV out when the patient saw the nurse was not going to leave. An 43 gauge was taken out of the left ac.

## 2015-07-04 ENCOUNTER — Encounter (HOSPITAL_COMMUNITY): Payer: Self-pay | Admitting: Emergency Medicine

## 2015-07-04 ENCOUNTER — Emergency Department (HOSPITAL_COMMUNITY): Payer: Medicaid Other

## 2015-07-04 ENCOUNTER — Emergency Department (HOSPITAL_COMMUNITY)
Admission: EM | Admit: 2015-07-04 | Discharge: 2015-07-05 | Disposition: A | Discharge status: Home / Self Care | Payer: Medicaid Other | Attending: Emergency Medicine | Admitting: Emergency Medicine

## 2015-07-04 DIAGNOSIS — Z792 Long term (current) use of antibiotics: Secondary | ICD-10-CM | POA: Diagnosis not present

## 2015-07-04 DIAGNOSIS — Z8669 Personal history of other diseases of the nervous system and sense organs: Secondary | ICD-10-CM | POA: Insufficient documentation

## 2015-07-04 DIAGNOSIS — M1990 Unspecified osteoarthritis, unspecified site: Secondary | ICD-10-CM | POA: Diagnosis not present

## 2015-07-04 DIAGNOSIS — F419 Anxiety disorder, unspecified: Secondary | ICD-10-CM | POA: Insufficient documentation

## 2015-07-04 DIAGNOSIS — I10 Essential (primary) hypertension: Secondary | ICD-10-CM | POA: Diagnosis not present

## 2015-07-04 DIAGNOSIS — Z79899 Other long term (current) drug therapy: Secondary | ICD-10-CM | POA: Diagnosis not present

## 2015-07-04 DIAGNOSIS — Z8701 Personal history of pneumonia (recurrent): Secondary | ICD-10-CM | POA: Insufficient documentation

## 2015-07-04 DIAGNOSIS — Z720 Tobacco use: Secondary | ICD-10-CM | POA: Insufficient documentation

## 2015-07-04 DIAGNOSIS — L97529 Non-pressure chronic ulcer of other part of left foot with unspecified severity: Secondary | ICD-10-CM

## 2015-07-04 DIAGNOSIS — L089 Local infection of the skin and subcutaneous tissue, unspecified: Secondary | ICD-10-CM | POA: Diagnosis present

## 2015-07-04 DIAGNOSIS — G8929 Other chronic pain: Secondary | ICD-10-CM | POA: Diagnosis not present

## 2015-07-04 NOTE — ED Notes (Signed)
Bed: WA09 Expected date:  Expected time:  Means of arrival:  Comments: Ems-leg swelling and infection

## 2015-07-04 NOTE — ED Notes (Signed)
Pt is a homeless male, brought in by EMS with c/o wound infection.  Pt reports that he was seen and admitted here on the 15th for "bone infection"and was subsequently discharged home with antibiotic prescription.  Pt reports "wound did not get any better and it's getting worse"---- pt reports increased pain and swelling to left foot/leg.
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
## 2015-07-05 LAB — CBC WITH DIFFERENTIAL/PLATELET
Basophils Absolute: 0 10*3/uL (ref 0.0–0.1)
Basophils Relative: 0 % (ref 0–1)
EOS ABS: 0.1 10*3/uL (ref 0.0–0.7)
Eosinophils Relative: 2 % (ref 0–5)
HCT: 32.3 % — ABNORMAL LOW (ref 39.0–52.0)
Hemoglobin: 10.3 g/dL — ABNORMAL LOW (ref 13.0–17.0)
Lymphocytes Relative: 12 % (ref 12–46)
Lymphs Abs: 0.7 10*3/uL (ref 0.7–4.0)
MCH: 25.3 pg — ABNORMAL LOW (ref 26.0–34.0)
MCHC: 31.9 g/dL (ref 30.0–36.0)
MCV: 79.4 fL (ref 78.0–100.0)
MONO ABS: 0.7 10*3/uL (ref 0.1–1.0)
MONOS PCT: 11 % (ref 3–12)
Neutro Abs: 4.7 10*3/uL (ref 1.7–7.7)
Neutrophils Relative %: 75 % (ref 43–77)
Platelets: 217 10*3/uL (ref 150–400)
RBC: 4.07 MIL/uL — ABNORMAL LOW (ref 4.22–5.81)
RDW: 15.4 % (ref 11.5–15.5)
WBC: 6.2 10*3/uL (ref 4.0–10.5)

## 2015-07-05 LAB — BASIC METABOLIC PANEL
ANION GAP: 7 (ref 5–15)
BUN: 10 mg/dL (ref 6–20)
CO2: 28 mmol/L (ref 22–32)
CREATININE: 0.83 mg/dL (ref 0.61–1.24)
Calcium: 8.2 mg/dL — ABNORMAL LOW (ref 8.9–10.3)
Chloride: 99 mmol/L — ABNORMAL LOW (ref 101–111)
GFR calc Af Amer: >60 mL/min (ref >60)
GFR calc non Af Amer: >60 mL/min (ref >60)
GLUCOSE: 94 mg/dL (ref 65–99)
Potassium: 3.6 mmol/L (ref 3.5–5.1)
Sodium: 134 mmol/L — ABNORMAL LOW (ref 135–145)

## 2015-07-05 LAB — C-REACTIVE PROTEIN: CRP: 6.4 mg/dL — AB (ref <1.0)

## 2015-07-05 LAB — SEDIMENTATION RATE: Sed Rate: 70 mm/hr — ABNORMAL HIGH (ref 0–16)

## 2015-07-05 MED ORDER — OXYCODONE-ACETAMINOPHEN 5-325 MG PO TABS
2.0000 | ORAL_TABLET | Freq: Once | ORAL | Status: AC
Start: 2015-07-05 — End: 2015-07-05
  Administered 2015-07-05: 2 via ORAL
  Filled 2015-07-05: qty 2

## 2015-07-05 MED ORDER — CLINDAMYCIN HCL 300 MG PO CAPS
300.0000 mg | ORAL_CAPSULE | Freq: Once | ORAL | Status: AC
  Administered 2015-07-05: 300 mg via ORAL
  Filled 2015-07-05: qty 1

## 2015-07-05 MED ORDER — CLINDAMYCIN HCL 300 MG PO CAPS: 300.0000 mg | ORAL_CAPSULE | Freq: Three times a day (TID) | ORAL | Status: DC

## 2015-07-05 MED ORDER — SILVER SULFADIAZINE 1 % EX CREA
TOPICAL_CREAM | Freq: Once | CUTANEOUS | Status: AC
  Administered 2015-07-05: 02:00:00 via TOPICAL
  Filled 2015-07-05: qty 50

## 2015-07-05 MED ORDER — SILVER SULFADIAZINE 1 % EX CREA: TOPICAL_CREAM | Freq: Two times a day (BID) | CUTANEOUS | Status: DC

## 2015-07-05 NOTE — Discharge Instructions (Signed)
Skin Ulcer A skin ulcer is an open sore that can be shallow or deep. Skin ulcers sometimes become infected and are difficult to treat. It may be 1 month or longer before real healing progress is made. CAUSES   Injury.  Problems with the veins or arteries.  Diabetes.  Insect bites.  Bedsores.  Inflammatory conditions. SYMPTOMS   Pain, redness, swelling, and tenderness around the ulcer.  Fever.  Bleeding from the ulcer.  Yellow or clear fluid coming from the ulcer. DIAGNOSIS  There are many types of skin ulcers. Any open sores will be examined. Certain tests will be done to determine the kind of ulcer you have. The right treatment depends on the type of ulcer you have. TREATMENT  Treatment is a long-term challenge. It may include:  Wearing an elastic wrap, compression stockings, or gel cast over the ulcer area.  Taking antibiotic medicines or putting antibiotic creams on the affected area if there is an infection. HOME CARE INSTRUCTIONS  Put on your bandages (dressings), wraps, or casts over the ulcer as directed by your caregiver.  Wet to dry dressing twice a day, using cream as directed  Change all dressings as directed by your caregiver.  Take all medicines as directed by your caregiver.  Keep the affected area clean and dry.  Avoid injuries to the affected area.  Eat a well-balanced, healthy diet that includes plenty of fruit and vegetables.  If you smoke, consider quitting or decreasing the amount of cigarettes you smoke.  Once the ulcer heals, get regular exercise as directed by your caregiver.  Work with your caregiver to make sure your blood pressure, cholesterol, and diabetes are well-controlled.  Keep your skin moisturized. Dry skin can crack and lead to skin ulcers. SEEK IMMEDIATE MEDICAL CARE IF:   Your pain gets worse.  You have swelling, redness, or fluids around the ulcer.  You have chills.  You have a fever. MAKE SURE YOU:   Understand  these instructions.  Will watch your condition.  Will get help right away if you are not doing well or get worse. Document Released: 12/07/2004 Document Revised: 01/22/2012 Document Reviewed: 06/16/2011 [facility] Patient Information 2015 [place], [place]. This information is not intended to replace advice given to you by your health care provider. Make sure you discuss any questions you have with your health care provider.

## 2015-07-10 LAB — CULTURE, BLOOD (ROUTINE X 2)
CULTURE: NO GROWTH
CULTURE: NO GROWTH
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
## 2015-11-01 ENCOUNTER — Other Ambulatory Visit (HOSPITAL_COMMUNITY): Payer: Self-pay | Admitting: Orthopedic Surgery

## 2015-11-02 ENCOUNTER — Encounter (HOSPITAL_COMMUNITY): Payer: Self-pay | Admitting: Emergency Medicine

## 2015-11-02 ENCOUNTER — Inpatient Hospital Stay (HOSPITAL_COMMUNITY)
Admission: EM | Admit: 2015-11-02 | Discharge: 2015-11-10 | DRG: 475 | Disposition: A | Discharge status: Skilled Nursing Facility | Payer: Medicaid Other | Attending: Orthopedic Surgery | Admitting: Orthopedic Surgery

## 2015-11-02 DIAGNOSIS — M545 Low back pain: Secondary | ICD-10-CM | POA: Diagnosis present

## 2015-11-02 DIAGNOSIS — I10 Essential (primary) hypertension: Secondary | ICD-10-CM | POA: Diagnosis present

## 2015-11-02 DIAGNOSIS — L97424 Non-pressure chronic ulcer of left heel and midfoot with necrosis of bone: Secondary | ICD-10-CM | POA: Diagnosis present

## 2015-11-02 DIAGNOSIS — G8929 Other chronic pain: Secondary | ICD-10-CM | POA: Diagnosis present

## 2015-11-02 DIAGNOSIS — Z886 Allergy status to analgesic agent status: Secondary | ICD-10-CM

## 2015-11-02 DIAGNOSIS — M86672 Other chronic osteomyelitis, left ankle and foot: Principal | ICD-10-CM | POA: Diagnosis present

## 2015-11-02 DIAGNOSIS — Z79899 Other long term (current) drug therapy: Secondary | ICD-10-CM

## 2015-11-02 DIAGNOSIS — Z590 Homelessness: Secondary | ICD-10-CM

## 2015-11-02 DIAGNOSIS — L02619 Cutaneous abscess of unspecified foot: Secondary | ICD-10-CM

## 2015-11-02 DIAGNOSIS — G629 Polyneuropathy, unspecified: Secondary | ICD-10-CM | POA: Diagnosis present

## 2015-11-02 DIAGNOSIS — IMO0002 Reserved for concepts with insufficient information to code with codable children: Secondary | ICD-10-CM

## 2015-11-02 DIAGNOSIS — F17210 Nicotine dependence, cigarettes, uncomplicated: Secondary | ICD-10-CM | POA: Diagnosis present

## 2015-11-02 DIAGNOSIS — L03119 Cellulitis of unspecified part of limb: Secondary | ICD-10-CM

## 2015-11-02 DIAGNOSIS — F419 Anxiety disorder, unspecified: Secondary | ICD-10-CM | POA: Diagnosis present

## 2015-11-02 DIAGNOSIS — Z981 Arthrodesis status: Secondary | ICD-10-CM

## 2015-11-02 MED ORDER — CEFAZOLIN SODIUM-DEXTROSE 2-3 GM-% IV SOLR: 2.0000 g | INTRAVENOUS | Status: DC

## 2015-11-02 MED ORDER — SODIUM CHLORIDE 0.9 % IV SOLN
INTRAVENOUS | Status: DC
  Administered 2015-11-02: 23:00:00 via INTRAVENOUS

## 2015-11-02 MED ORDER — MORPHINE SULFATE (PF) 4 MG/ML IV SOLN
4.0000 mg | Freq: Once | INTRAVENOUS | Status: AC
  Administered 2015-11-02: 4 mg via INTRAVENOUS
  Filled 2015-11-02: qty 1

## 2015-11-02 MED ORDER — CHLORHEXIDINE GLUCONATE 4 % EX LIQD: 60.0000 mL | Freq: Once | CUTANEOUS | Status: DC

## 2015-11-02 NOTE — Progress Notes (Addendum)
The phone number listed as Mr Ethan [name] phone number is his friends Lorn JunesJames Dillard. [name] drives patient to the doctor and hospital.  Mr Ethan [name] is homeless.  [name] is suppose to pick patient up tonight and bring him in to the hospital in am. [name] had not receive a call from patient.  [name] is going to look for patient and if he finds patient he will have him call me.  I instructed [name] of arrival time for patient of 7:30 and that patient should not have anything to eat or drink after midnight.  Medications in am Tylenol and Ativan if needed, with a sip of water.

## 2015-11-02 NOTE — ED Notes (Signed)
MD at bedside. 
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
## 2015-11-02 NOTE — ED Notes (Signed)
The patient is complaining of foot pain and called EMS.  He is homeless [facility] [employer] [address] [place].  He says he is scheduled for surgery in the morning for an amputation of his left foot.  The patient was given antibiotics as  prophylactic for the surgery.  We have confirmed he is having surgery with Dr. Lajoyce Cornersuda for tomorrow morning.  He rates his pain.
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
## 2015-11-03 ENCOUNTER — Encounter (HOSPITAL_COMMUNITY): Payer: Self-pay | Admitting: Anesthesiology

## 2015-11-03 ENCOUNTER — Encounter (HOSPITAL_COMMUNITY)
Admission: EM | Disposition: A | Discharge status: Skilled Nursing Facility | Payer: Self-pay | Source: Home / Self Care | Attending: Orthopedic Surgery

## 2015-11-03 ENCOUNTER — Emergency Department (HOSPITAL_COMMUNITY): Payer: Medicaid Other | Admitting: Anesthesiology

## 2015-11-03 ENCOUNTER — Inpatient Hospital Stay (HOSPITAL_COMMUNITY): Admission: RE | Admit: 2015-11-03 | Payer: Medicaid Other | Source: Ambulatory Visit | Admitting: Orthopedic Surgery

## 2015-11-03 DIAGNOSIS — M545 Low back pain: Secondary | ICD-10-CM | POA: Diagnosis present

## 2015-11-03 DIAGNOSIS — F419 Anxiety disorder, unspecified: Secondary | ICD-10-CM | POA: Diagnosis present

## 2015-11-03 DIAGNOSIS — I10 Essential (primary) hypertension: Secondary | ICD-10-CM | POA: Diagnosis present

## 2015-11-03 DIAGNOSIS — Z981 Arthrodesis status: Secondary | ICD-10-CM | POA: Diagnosis not present

## 2015-11-03 DIAGNOSIS — M86672 Other chronic osteomyelitis, left ankle and foot: Secondary | ICD-10-CM | POA: Diagnosis present

## 2015-11-03 DIAGNOSIS — G629 Polyneuropathy, unspecified: Secondary | ICD-10-CM | POA: Diagnosis present

## 2015-11-03 DIAGNOSIS — F17210 Nicotine dependence, cigarettes, uncomplicated: Secondary | ICD-10-CM | POA: Diagnosis present

## 2015-11-03 DIAGNOSIS — L03119 Cellulitis of unspecified part of limb: Secondary | ICD-10-CM | POA: Diagnosis present

## 2015-11-03 DIAGNOSIS — L97424 Non-pressure chronic ulcer of left heel and midfoot with necrosis of bone: Secondary | ICD-10-CM | POA: Diagnosis present

## 2015-11-03 DIAGNOSIS — G8929 Other chronic pain: Secondary | ICD-10-CM | POA: Diagnosis present

## 2015-11-03 DIAGNOSIS — Z79899 Other long term (current) drug therapy: Secondary | ICD-10-CM | POA: Diagnosis not present

## 2015-11-03 DIAGNOSIS — Z886 Allergy status to analgesic agent status: Secondary | ICD-10-CM | POA: Diagnosis not present

## 2015-11-03 DIAGNOSIS — IMO0002 Reserved for concepts with insufficient information to code with codable children: Secondary | ICD-10-CM

## 2015-11-03 DIAGNOSIS — Z590 Homelessness: Secondary | ICD-10-CM | POA: Diagnosis not present

## 2015-11-03 HISTORY — PX: AMPUTATION: SHX166

## 2015-11-03 HISTORY — PX: BELOW KNEE LEG AMPUTATION: SUR23

## 2015-11-03 LAB — BASIC METABOLIC PANEL
Anion gap: 10 (ref 5–15)
BUN: 18 mg/dL (ref 6–20)
CHLORIDE: 101 mmol/L (ref 101–111)
CO2: 24 mmol/L (ref 22–32)
Calcium: 8.8 mg/dL — ABNORMAL LOW (ref 8.9–10.3)
Creatinine, Ser: 0.76 mg/dL (ref 0.61–1.24)
GFR calc non Af Amer: >60 mL/min (ref >60)
Glucose, Bld: 111 mg/dL — ABNORMAL HIGH (ref 65–99)
POTASSIUM: 3.3 mmol/L — AB (ref 3.5–5.1)
SODIUM: 135 mmol/L (ref 135–145)

## 2015-11-03 LAB — CBC WITH DIFFERENTIAL/PLATELET
BASOS PCT: 0 %
Basophils Absolute: 0 10*3/uL (ref 0.0–0.1)
EOS ABS: 0 10*3/uL (ref 0.0–0.7)
Eosinophils Relative: 0 %
HEMATOCRIT: 38.4 % — AB (ref 39.0–52.0)
HEMOGLOBIN: 12.2 g/dL — AB (ref 13.0–17.0)
LYMPHS ABS: 1.3 10*3/uL (ref 0.7–4.0)
Lymphocytes Relative: 14 %
MCH: 25.2 pg — ABNORMAL LOW (ref 26.0–34.0)
MCHC: 31.8 g/dL (ref 30.0–36.0)
MCV: 79.2 fL (ref 78.0–100.0)
Monocytes Absolute: 0.8 10*3/uL (ref 0.1–1.0)
Monocytes Relative: 10 %
NEUTROS ABS: 6.7 10*3/uL (ref 1.7–7.7)
NEUTROS PCT: 76 %
Platelets: 237 10*3/uL (ref 150–400)
RBC: 4.85 MIL/uL (ref 4.22–5.81)
RDW: 15.3 % (ref 11.5–15.5)
WBC: 8.8 10*3/uL (ref 4.0–10.5)
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
## 2015-11-03 SURGERY — AMPUTATION BELOW KNEE
Anesthesia: General | Site: Leg Lower | Laterality: Left

## 2015-11-03 MED ORDER — CEFAZOLIN SODIUM-DEXTROSE 2-3 GM-% IV SOLR
2.0000 g | INTRAVENOUS | Status: AC
  Administered 2015-11-03: 2 g via INTRAVENOUS
  Filled 2015-11-03: qty 50

## 2015-11-03 MED ORDER — ACETAMINOPHEN 650 MG RE SUPP: 650.0000 mg | Freq: Four times a day (QID) | RECTAL | Status: DC | PRN

## 2015-11-03 MED ORDER — MEPERIDINE HCL 25 MG/ML IJ SOLN: 6.2500 mg | INTRAMUSCULAR | Status: DC | PRN

## 2015-11-03 MED ORDER — OXYCODONE-ACETAMINOPHEN 5-325 MG PO TABS: 1.0000 | ORAL_TABLET | ORAL | Status: DC | PRN

## 2015-11-03 MED ORDER — GABAPENTIN 300 MG PO CAPS
300.0000 mg | ORAL_CAPSULE | Freq: Three times a day (TID) | ORAL | Status: DC
  Administered 2015-11-03 – 2015-11-04 (×5): 300 mg via ORAL
  Filled 2015-11-03 (×5): qty 1

## 2015-11-03 MED ORDER — PROMETHAZINE HCL 25 MG/ML IJ SOLN: 6.2500 mg | INTRAMUSCULAR | Status: DC | PRN

## 2015-11-03 MED ORDER — LIDOCAINE HCL (CARDIAC) 20 MG/ML IV SOLN
INTRAVENOUS | Status: DC | PRN
  Administered 2015-11-03: 100 mg via INTRAVENOUS

## 2015-11-03 MED ORDER — METOCLOPRAMIDE HCL 5 MG/ML IJ SOLN
5.0000 mg | Freq: Three times a day (TID) | INTRAMUSCULAR | Status: DC | PRN
  Filled 2015-11-03: qty 2

## 2015-11-03 MED ORDER — ONDANSETRON HCL 4 MG PO TABS: 4.0000 mg | ORAL_TABLET | Freq: Four times a day (QID) | ORAL | Status: DC | PRN

## 2015-11-03 MED ORDER — ASPIRIN EC 325 MG PO TBEC
325.0000 mg | DELAYED_RELEASE_TABLET | Freq: Every day | ORAL | Status: DC
  Administered 2015-11-03 – 2015-11-10 (×8): 325 mg via ORAL
  Filled 2015-11-03 (×8): qty 1

## 2015-11-03 MED ORDER — LACTATED RINGERS IV SOLN
INTRAVENOUS | Status: DC
  Administered 2015-11-03: 10:00:00 via INTRAVENOUS

## 2015-11-03 MED ORDER — HYDROMORPHONE HCL 1 MG/ML IJ SOLN
1.0000 mg | INTRAMUSCULAR | Status: DC | PRN
  Administered 2015-11-03: 1 mg via INTRAVENOUS
  Filled 2015-11-03: qty 1

## 2015-11-03 MED ORDER — OXYCODONE HCL 5 MG PO TABS
5.0000 mg | ORAL_TABLET | ORAL | Status: DC | PRN
  Administered 2015-11-03 (×2): 10 mg via ORAL
  Filled 2015-11-03: qty 2

## 2015-11-03 MED ORDER — METOCLOPRAMIDE HCL 5 MG PO TABS: 5.0000 mg | ORAL_TABLET | Freq: Three times a day (TID) | ORAL | Status: DC | PRN

## 2015-11-03 MED ORDER — NICOTINE 21 MG/24HR TD PT24
21.0000 mg | MEDICATED_PATCH | Freq: Once | TRANSDERMAL | Status: AC
  Administered 2015-11-03: 21 mg via TRANSDERMAL
  Filled 2015-11-03: qty 1

## 2015-11-03 MED ORDER — MIDAZOLAM HCL 5 MG/5ML IJ SOLN
INTRAMUSCULAR | Status: DC | PRN
  Administered 2015-11-03: 2 mg via INTRAVENOUS

## 2015-11-03 MED ORDER — MORPHINE SULFATE (PF) 4 MG/ML IV SOLN
4.0000 mg | INTRAVENOUS | Status: DC | PRN
  Administered 2015-11-03: 4 mg via INTRAVENOUS
  Filled 2015-11-03: qty 1

## 2015-11-03 MED ORDER — LORAZEPAM 0.5 MG PO TABS: 0.2500 mg | ORAL_TABLET | Freq: Three times a day (TID) | ORAL | Status: DC | PRN

## 2015-11-03 MED ORDER — LISINOPRIL 20 MG PO TABS: 20.0000 mg | ORAL_TABLET | Freq: Every day | ORAL | Status: DC

## 2015-11-03 MED ORDER — LACTATED RINGERS IV SOLN
INTRAVENOUS | Status: DC | PRN
  Administered 2015-11-03: 10:00:00 via INTRAVENOUS

## 2015-11-03 MED ORDER — HYDROMORPHONE HCL 1 MG/ML IJ SOLN
0.2500 mg | INTRAMUSCULAR | Status: DC | PRN
  Administered 2015-11-03 (×4): 0.5 mg via INTRAVENOUS

## 2015-11-03 MED ORDER — KETOROLAC TROMETHAMINE 30 MG/ML IJ SOLN
30.0000 mg | Freq: Four times a day (QID) | INTRAMUSCULAR | Status: AC | PRN
  Administered 2015-11-03 – 2015-11-05 (×5): 30 mg via INTRAVENOUS
  Filled 2015-11-03 (×7): qty 1

## 2015-11-03 MED ORDER — HYDROMORPHONE HCL 1 MG/ML IJ SOLN
INTRAMUSCULAR | Status: AC
  Filled 2015-11-03: qty 1

## 2015-11-03 MED ORDER — INFLUENZA VAC SPLIT QUAD 0.5 ML IM SUSY
0.5000 mL | PREFILLED_SYRINGE | INTRAMUSCULAR | Status: AC
  Administered 2015-11-04: 0.5 mL via INTRAMUSCULAR
  Filled 2015-11-03: qty 0.5

## 2015-11-03 MED ORDER — POLYETHYLENE GLYCOL 3350 17 G PO PACK: 17.0000 g | PACK | Freq: Every day | ORAL | Status: DC | PRN

## 2015-11-03 MED ORDER — METHOCARBAMOL 500 MG PO TABS
500.0000 mg | ORAL_TABLET | Freq: Four times a day (QID) | ORAL | Status: DC | PRN
  Administered 2015-11-04 – 2015-11-10 (×18): 500 mg via ORAL
  Filled 2015-11-03 (×19): qty 1

## 2015-11-03 MED ORDER — CEFAZOLIN SODIUM 1-5 GM-% IV SOLN
1.0000 g | Freq: Four times a day (QID) | INTRAVENOUS | Status: AC
Start: 2015-11-03 — End: 2015-11-04
  Administered 2015-11-03 – 2015-11-04 (×3): 1 g via INTRAVENOUS
  Filled 2015-11-03 (×4): qty 50

## 2015-11-03 MED ORDER — HYDROMORPHONE HCL 1 MG/ML IJ SOLN
2.0000 mg | INTRAMUSCULAR | Status: DC | PRN
  Administered 2015-11-03 – 2015-11-07 (×27): 2 mg via INTRAVENOUS
  Filled 2015-11-03 (×28): qty 2

## 2015-11-03 MED ORDER — SODIUM CHLORIDE 0.9 % IV SOLN
INTRAVENOUS | Status: DC
  Administered 2015-11-03: 14:00:00 via INTRAVENOUS

## 2015-11-03 MED ORDER — ONDANSETRON HCL 4 MG/2ML IJ SOLN: 4.0000 mg | Freq: Four times a day (QID) | INTRAMUSCULAR | Status: DC | PRN

## 2015-11-03 MED ORDER — HYDROMORPHONE HCL 1 MG/ML IJ SOLN
INTRAMUSCULAR | Status: DC | PRN
  Administered 2015-11-03 (×2): 0.5 mg via INTRAVENOUS

## 2015-11-03 MED ORDER — PROPOFOL 10 MG/ML IV BOLUS
INTRAVENOUS | Status: DC | PRN
  Administered 2015-11-03: 200 mg via INTRAVENOUS

## 2015-11-03 MED ORDER — DOCUSATE SODIUM 100 MG PO CAPS
100.0000 mg | ORAL_CAPSULE | Freq: Two times a day (BID) | ORAL | Status: DC
  Administered 2015-11-03 – 2015-11-10 (×15): 100 mg via ORAL
  Filled 2015-11-03 (×15): qty 1

## 2015-11-03 MED ORDER — 0.9 % SODIUM CHLORIDE (POUR BTL) OPTIME
TOPICAL | Status: DC | PRN
  Administered 2015-11-03: 1000 mL

## 2015-11-03 MED ORDER — CHLORHEXIDINE GLUCONATE 4 % EX LIQD
60.0000 mL | Freq: Once | CUTANEOUS | Status: AC
  Administered 2015-11-03: 4 via TOPICAL
  Filled 2015-11-03: qty 60

## 2015-11-03 MED ORDER — METHOCARBAMOL 1000 MG/10ML IJ SOLN
500.0000 mg | Freq: Four times a day (QID) | INTRAVENOUS | Status: DC | PRN
  Administered 2015-11-03: 500 mg via INTRAVENOUS
  Filled 2015-11-03 (×2): qty 5

## 2015-11-03 MED ORDER — OXYCODONE HCL 5 MG PO TABS
30.0000 mg | ORAL_TABLET | Freq: Four times a day (QID) | ORAL | Status: DC
  Administered 2015-11-03: 30 mg via ORAL
  Filled 2015-11-03: qty 6

## 2015-11-03 MED ORDER — OXYCODONE HCL 5 MG PO TABS
20.0000 mg | ORAL_TABLET | ORAL | Status: DC | PRN
  Administered 2015-11-03 – 2015-11-05 (×11): 20 mg via ORAL
  Filled 2015-11-03 (×11): qty 4

## 2015-11-03 MED ORDER — MIDAZOLAM HCL 2 MG/2ML IJ SOLN
0.5000 mg | Freq: Once | INTRAMUSCULAR | Status: AC | PRN
  Administered 2015-11-03 (×2): 0.5 mg via INTRAVENOUS

## 2015-11-03 MED ORDER — FENTANYL CITRATE (PF) 100 MCG/2ML IJ SOLN
INTRAMUSCULAR | Status: DC | PRN
  Administered 2015-11-03 (×8): 50 ug via INTRAVENOUS
  Administered 2015-11-03: 100 ug via INTRAVENOUS

## 2015-11-03 MED ORDER — ONDANSETRON HCL 4 MG/2ML IJ SOLN
INTRAMUSCULAR | Status: DC | PRN
  Administered 2015-11-03: 4 mg via INTRAVENOUS

## 2015-11-03 MED ORDER — ACETAMINOPHEN 325 MG PO TABS: 650.0000 mg | ORAL_TABLET | Freq: Four times a day (QID) | ORAL | Status: DC | PRN

## 2015-11-03 SURGICAL SUPPLY — 38 items
BLADE SAW RECIP 87.9 MT (BLADE) ×2 IMPLANT
BLADE SURG 21 STRL SS (BLADE) ×2 IMPLANT
BNDG COHESIVE 6X5 TAN STRL LF (GAUZE/BANDAGES/DRESSINGS) ×3 IMPLANT
BNDG GAUZE ELAST 4 BULKY (GAUZE/BANDAGES/DRESSINGS) ×4 IMPLANT
COVER SURGICAL LIGHT HANDLE (MISCELLANEOUS) ×4 IMPLANT
CUFF TOURNIQUET SINGLE 34IN LL (TOURNIQUET CUFF) IMPLANT
CUFF TOURNIQUET SINGLE 44IN (TOURNIQUET CUFF) IMPLANT
DRAPE EXTREMITY T 121X128X90 (DRAPE) ×2 IMPLANT
DRAPE PROXIMA HALF (DRAPES) ×4 IMPLANT
DRAPE U-SHAPE 47X51 STRL (DRAPES) ×2 IMPLANT
DRSG ADAPTIC 3X8 NADH LF (GAUZE/BANDAGES/DRESSINGS) ×2 IMPLANT
DRSG KUZMA FLUFF (GAUZE/BANDAGES/DRESSINGS) ×1 IMPLANT
DRSG PAD ABDOMINAL 8X10 ST (GAUZE/BANDAGES/DRESSINGS) ×3 IMPLANT
DURAPREP 26ML APPLICATOR (WOUND CARE) ×2 IMPLANT
ELECT REM PT RETURN 9FT ADLT (ELECTROSURGICAL) ×2
ELECTRODE REM PT RTRN 9FT ADLT (ELECTROSURGICAL) ×1 IMPLANT
GAUZE SPONGE 4X4 12PLY STRL (GAUZE/BANDAGES/DRESSINGS) ×2 IMPLANT
GLOVE BIOGEL PI IND STRL 9 (GLOVE) ×1 IMPLANT
GLOVE BIOGEL PI INDICATOR 9 (GLOVE) ×1
GLOVE SURG ORTHO 9.0 STRL STRW (GLOVE) ×2 IMPLANT
GOWN STRL REUS W/ TWL XL LVL3 (GOWN DISPOSABLE) ×2 IMPLANT
GOWN STRL REUS W/TWL XL LVL3 (GOWN DISPOSABLE) ×4
KIT BASIN OR (CUSTOM PROCEDURE TRAY) ×2 IMPLANT
KIT ROOM TURNOVER OR (KITS) ×2 IMPLANT
MANIFOLD NEPTUNE II (INSTRUMENTS) ×2 IMPLANT
NS IRRIG 1000ML POUR BTL (IV SOLUTION) ×2 IMPLANT
PACK GENERAL/GYN (CUSTOM PROCEDURE TRAY) ×2 IMPLANT
PAD ARMBOARD 7.5X6 YLW CONV (MISCELLANEOUS) ×4 IMPLANT
SPONGE GAUZE 4X4 12PLY STER LF (GAUZE/BANDAGES/DRESSINGS) ×1 IMPLANT
SPONGE LAP 18X18 X RAY DECT (DISPOSABLE) IMPLANT
STAPLER VISISTAT 35W (STAPLE) ×1 IMPLANT
STOCKINETTE IMPERVIOUS LG (DRAPES) ×2 IMPLANT
SUT SILK 2 0 (SUTURE) ×2
SUT SILK 2-0 18XBRD TIE 12 (SUTURE) ×1 IMPLANT
SUT VIC AB 1 CTX 27 (SUTURE) IMPLANT
TOWEL OR 17X24 6PK STRL BLUE (TOWEL DISPOSABLE) ×2 IMPLANT
TOWEL OR 17X26 10 PK STRL BLUE (TOWEL DISPOSABLE) ×2 IMPLANT
WATER STERILE IRR 1000ML POUR (IV SOLUTION) ×2 IMPLANT

## 2015-11-03 NOTE — Interval H&P Note (Signed)
History and Physical Interval Note:  11/03/2015 6:45 AM  Ethan [name]  has presented today for surgery, with the diagnosis of Osteomyelitis Left Calcaneus  The various methods of treatment have been discussed with the patient and family. After consideration of risks, benefits and other options for treatment, the patient has consented to  Procedure(s): Left Below Knee Amputation (Left) as a surgical intervention .  The patient's history has been reviewed, patient examined, no change in status, stable for surgery.  I have reviewed the patient's chart and labs.  Questions were answered to the patient's satisfaction.     DUDA,MARCUS V

## 2015-11-03 NOTE — Anesthesia Postprocedure Evaluation (Signed)
Anesthesia Post Note  Patient: Ethan [name]  Procedure(s) Performed: Procedure(s) (LRB): Left Below Knee Amputation (Left)  Patient location during evaluation: PACU Anesthesia Type: General Level of consciousness: awake and alert, oriented and patient cooperative Pain management: pain level controlled Vital Signs Assessment: post-procedure vital signs reviewed and stable Respiratory status: spontaneous breathing, nonlabored ventilation, respiratory function stable and patient connected to nasal cannula oxygen Cardiovascular status: blood pressure returned to baseline and stable Postop Assessment: no signs of nausea or vomiting Anesthetic complications: no    Last Vitals:  Filed Vitals:   11/03/15 1300 11/03/15 1305  BP:    Pulse: 73   Temp:  36.6 C  Resp: 11     Last Pain:  Filed Vitals:   11/03/15 1327  PainSc: Asleep                 Kynsie Falkner,E. Undray Allman

## 2015-11-03 NOTE — Anesthesia Preprocedure Evaluation (Addendum)
Anesthesia Evaluation  Patient identified by MRN, date of birth, ID band Patient awake    Reviewed: Allergy & Precautions, NPO status , Patient's Chart, lab work & pertinent test results  History of Anesthesia Complications Negative for: history of anesthetic complications  Airway Mallampati: II  TM Distance: >3 FB Neck ROM: Full    Dental  (+) Edentulous Upper, Edentulous Lower   Pulmonary COPD, Current Smoker,    breath sounds clear to auscultation       Cardiovascular hypertension, Pt. on medications (-) angina+ Peripheral Vascular Disease   Rhythm:Regular Rate:Normal     Neuro/Psych Anxiety Chronic back pain: narcotic use    GI/Hepatic GERD  Controlled,(+)     substance abuse (heroin daily)  IV drug use,   Endo/Other  negative endocrine ROS  Renal/GU negative Renal ROS     Musculoskeletal  (+) Arthritis , Osteoarthritis,    Abdominal   Peds  Hematology negative hematology ROS (+)   Anesthesia Other Findings   Reproductive/Obstetrics                          Anesthesia Physical Anesthesia Plan  ASA: III  Anesthesia Plan: General   Post-op Pain Management:    Induction: Intravenous  Airway Management Planned: LMA  Additional Equipment:   Intra-op Plan:   Post-operative Plan:   Informed Consent: I have reviewed the patients History and Physical, chart, labs and discussed the procedure including the risks, benefits and alternatives for the proposed anesthesia with the patient or authorized representative who has indicated his/her understanding and acceptance.     Plan Discussed with: CRNA and Surgeon  Anesthesia Plan Comments: (Plan routine monitors, GA- LMA OK)        Anesthesia Quick Evaluation

## 2015-11-03 NOTE — Progress Notes (Signed)
Utilization review completed.  

## 2015-11-03 NOTE — ED Notes (Signed)
Message sent to pharmacy about Hibiclens.

## 2015-11-03 NOTE — ED Notes (Signed)
Dr. Lajoyce Cornersuda paged to [phone] @ 0630.

## 2015-11-03 NOTE — Op Note (Signed)
   Date of Surgery: 11/03/2015  INDICATIONS: [name] is a 54 y.o.-year-old male who has been undergoing wound care at the [facility]. 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 Due to failure of conservative treatment patient presents at this time for transtibial amputation.Marland Kitchen.  PREOPERATIVE DIAGNOSIS: Chronic osteomyelitis left calcaneus  POSTOPERATIVE DIAGNOSIS: Same.  PROCEDURE: Transtibial amputation  SURGEON: Lajoyce Cornersuda, M.D.  ANESTHESIA:  general  IV FLUIDS AND URINE: See anesthesia.  ESTIMATED BLOOD LOSS: Minimal mL.  COMPLICATIONS: None.  DESCRIPTION OF PROCEDURE: The patient was brought to the operating room and underwent a general anesthetic. After adequate levels of anesthesia were obtained patient's lower extremity was prepped using DuraPrep draped into a sterile field. A timeout was called.  A transverse incision was made 11 cm distal to the tibial tubercle. This curved proximally and a large posterior flap was created. The tibia was transected 1 cm proximal to the skin incision. The fibula was transected just proximal to the tibial incision. The tibia was beveled anteriorly. A large posterior flap was created. The sciatic nerve was pulled cut and allowed to retract. The vascular bundles were suture ligated with 2-0 silk. The deep and superficial fascial layers were closed using #1 Vicryl. The skin was closed using staples and 2-0 nylon. The wound was covered with Adaptic orthopedic sponges AB dressing Kerlix and Coban. Patient was extubated taken to the PACU in stable condition.  Aldean BakerMarcus Duda, MD [facility] 11:05 AM

## 2015-11-03 NOTE — OR Nursing (Signed)
[name] is c/o 10/10 pain, yelling, cursing, demanding assistance.  Dilaudid 0.5 mg (1.5mg  total) and Versed 0.5mg  given.

## 2015-11-03 NOTE — Progress Notes (Signed)
Patient ID: Ethan [name], male   DOB: 07/05/1961, 54 y.o.   MRN: [id number] Patient is homeless and will require discharge to skilled nursing facility. Prescriptions on the chart for discharge.

## 2015-11-03 NOTE — Transfer of Care (Signed)
Immediate Anesthesia Transfer of Care Note  Patient: Ethan [name]  Procedure(s) Performed: Procedure(s): Left Below Knee Amputation (Left)  Patient Location: PACU  Anesthesia Type:General  Level of Consciousness: sedated  Airway & Oxygen Therapy: Patient Spontanous Breathing and Patient connected to face mask oxygen  Post-op Assessment: Report given to RN, Post -op Vital signs reviewed and stable and Patient moving all extremities  Post vital signs: Reviewed and stable  Last Vitals:  Filed Vitals:   11/03/15 0730 11/03/15 0800  BP: 148/80 138/86  Pulse: 79 77  Temp:    Resp:      Complications: No apparent anesthesia complications

## 2015-11-03 NOTE — Anesthesia Procedure Notes (Signed)
Procedure Name: LMA Insertion Date/Time: 11/03/2015 10:34 AM Performed by: Sharlene DoryWALKER, Elin Seats E Pre-anesthesia Checklist: Patient identified, Emergency Drugs available, Suction available, Patient being monitored and Timeout performed Patient Re-evaluated:Patient Re-evaluated prior to inductionOxygen Delivery Method: Circle system utilized Preoxygenation: Pre-oxygenation with 100% oxygen Intubation Type: IV induction LMA: LMA inserted LMA Size: 4.0 Number of attempts: 1 Placement Confirmation: positive ETCO2 and breath sounds checked- equal and bilateral Tube secured with: Tape Dental Injury: Teeth and Oropharynx as per pre-operative assessment

## 2015-11-03 NOTE — ED Notes (Signed)
Pt given shower in Pod C shower room with this RN and Vernona RiegerLaura NT. Pt cleaned with body wash first and then with the hibiclens soap afterwards. Pt was dried off and placed in clean clothing. Pts bed linens changed.

## 2015-11-04 ENCOUNTER — Encounter (HOSPITAL_COMMUNITY): Payer: Self-pay | Admitting: Orthopedic Surgery

## 2015-11-04 NOTE — Progress Notes (Signed)
Patient is stable with dry dressings Pain controlled Up with PT when able SNF pending - patient is homeless  N. Glee ArvinMichael Jeanae Whitmill, MD [facility] [phone] 7:39 AM

## 2015-11-04 NOTE — Clinical Social Work Note (Signed)
CSW spoke with patient about going to SNF for short term rehab.  Patient stated he has been in rehab in the past, and he is aware of what to expect.  CSW was given permission from patient to begin SNF search process.  CSW explained to patient that because he only has Medicaid his choices will be limited on where he can go.  CSW informed patient that [place] of [place], patient expressed he is okay with that as long as a place can be found for him.  CSW to continue to follow patient's progress, formal assessment to follow.  Ervin KnackEric R. Lauriel Helin, MSW, Theresia MajorsLCSWA [phone] 11/04/2015 5:28 PM

## 2015-11-04 NOTE — Evaluation (Signed)
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  PMH significant for COPD, PLIF (10/15), peripheral neuropathy, LBP.     Clinical Impression   Pt reports he was independent with ADLs PTA. Currently pt is overall min assist for ADLs and functional mobility. Pt in significant pain at this time but agreeable to participate in therapy. Began safety and ADL education; pt verbalized understanding. Recommending d/c to SNF for further rehab to maximize independence and safety with ADLs and functional mobility. Pt would benefit from continued skilled OT in order to increase independence and safety with standing ADL tasks, toilet, and shower transfers.    Follow Up Recommendations  SNF;Supervision/Assistance - 24 hour    Equipment Recommendations  Other (comment) (TBD)    Recommendations for Other Services       Precautions / Restrictions Precautions Precautions: Fall Restrictions Weight Bearing Restrictions: Yes RLE Weight Bearing: Non weight bearing      Mobility Bed Mobility Overal bed mobility: Modified Independent             General bed mobility comments: Pt OOB in chair  Transfers Overall transfer level: Needs assistance Equipment used: Rolling walker (2 wheeled) Transfers: Sit to/from Stand Sit to Stand: Min assist         General transfer comment: Min assist to boost up from chair and for balance in standing. Good hand placement and technique.    Balance Overall balance assessment: Needs assistance Sitting-balance support: No upper extremity supported Sitting balance-Leahy Scale: Good Sitting balance - Comments: able to don socks and shoes without asssitance or loss of balance   Standing balance support: Bilateral upper extremity supported Standing balance-Leahy Scale:  Poor Standing balance comment: RW for support                            ADL Overall ADL's : Needs assistance/impaired Eating/Feeding: Set up;Sitting   Grooming: Set up;Sitting       Lower Body Bathing: Minimal assistance;Sit to/from stand       Lower Body Dressing: Minimal assistance;Sit to/from stand Lower Body Dressing Details (indicate cue type and reason): Educated on compensatory strategies for LB ADLs. Pt able to reach R foot to don/doff socks/shoes. Assist required to manage clothing in standing Toilet Transfer: Minimal assistance;Ambulation;Comfort height toilet;RW   Toileting- Clothing Manipulation and Hygiene: Minimal assistance;Sit to/from stand       Functional mobility during ADLs: Minimal assistance;Rolling walker General ADL Comments: No family present for OT eval. Educated on safety with RW.     Vision     Perception     Praxis      Pertinent Vitals/Pain Pain Assessment: 0-10 Pain Score: 9  Pain Location: L leg  Pain Descriptors / Indicators: Aching;Grimacing;Guarding;Crying Pain Intervention(s): Limited activity within patient's tolerance;Monitored during session;Patient requesting pain meds-RN notified     Hand Dominance     Extremity/Trunk Assessment Upper Extremity Assessment Upper Extremity Assessment: Overall WFL for tasks assessed   Lower Extremity Assessment Lower Extremity Assessment: Defer to PT evaluation   Cervical / Trunk Assessment Cervical / Trunk Assessment: Kyphotic   Communication Communication Communication: No difficulties   Cognition Arousal/Alertness: Awake/alert Behavior During Therapy: WFL for tasks assessed/performed Overall Cognitive Status: Within Functional Limits for tasks assessed  General Comments       Exercises       Shoulder Instructions      Home Living Family/patient expects to be discharged to:: Shelter/Homeless                                  Additional Comments: plans are for discharge to SNF      Prior Functioning/Environment Level of Independence: Independent with assistive device(s)        Comments: uses rollator for ambulation    OT Diagnosis: Acute pain;Generalized weakness   OT Problem List: Impaired balance (sitting and/or standing);Decreased safety awareness;Decreased knowledge of use of DME or AE;Decreased knowledge of precautions;Pain   OT Treatment/Interventions: Self-care/ADL training;DME and/or AE instruction;Patient/family education    OT Goals(Current goals can be found in the care plan section) Acute Rehab OT Goals Patient Stated Goal: stop hurting so bad OT Goal Formulation: With patient Time For Goal Achievement: 11/18/15 Potential to Achieve Goals: Good ADL Goals Pt Will Perform Grooming: with supervision;standing Pt Will Transfer to Toilet: with supervision;ambulating;regular height toilet Pt Will Perform Toileting - Clothing Manipulation and hygiene: with supervision;sit to/from stand;sitting/lateral leans Pt Will Perform Tub/Shower Transfer: Tub transfer;ambulating;rolling walker;Shower transfer;3 in 1  OT Frequency: Min 2X/week   Barriers to D/C: Other (comment) (pt is homeless)          Co-evaluation              End of Session Equipment Utilized During Treatment: Gait belt;Rolling walker Nurse Communication: Patient requests pain meds  Activity Tolerance: Patient limited by pain Patient left: in chair;with call bell/phone within reach   Time: 1202-1215 OT Time Calculation (min): 13 min Charges:  OT General Charges $OT Visit: 1 Procedure OT Evaluation $Initial OT Evaluation Tier I: 1 Procedure G-Codes:     Gaye Alken M.S., OTR/L Pager: [phone]  11/04/2015, 12:31 PM

## 2015-11-04 NOTE — Clinical Social Work Note (Addendum)
Clinical Social Work Assessment  Patient Details  Name: Ethan [name] MRN: [id number] Date of Birth: January 14, 1961  Date of referral:  11/04/15               Reason for consult:  Facility Placement                Permission sought to share information with:    Permission granted to share information::     Name::        Agency::  SNF admissions  Relationship::     Contact Information:     Housing/Transportation Living arrangements for the past 2 months:  Homeless Source of Information:  Patient Patient Interpreter Needed:  None Criminal Activity/Legal Involvement Pertinent to Current Situation/Hospitalization:  No - Comment as needed Significant Relationships:  None Lives with:  Other (Comment) (Patient is currently homeless) Do you feel safe going back to the place where you live?  No (Patient needs some short term rehab before he can be released to community) Need for family participation in patient care:  No (Coment)  Care giving concerns:  Patient is homeless and has some medical needs .   Social Worker assessment / plan:  Patient is a 54 year old male who is homeless patient states he has a tent which he stays in.  Patient expressed that he is used to staying in a tent and has been staying in shelters when he needs to.  Patient expressed he used to live in a house and then was kicked out due to the house being foreclosed on.  Patient states he has some friends which he stays with if he can.  Patient stated that he does not have any kids or family members.  Patient was explained that since he only has Medicaid, his options will be limited, patient stated he is okay with that, but would prefer [facility] SNF placement if possible.  Patient expressed that he has been to a SNF in [facility], but is willing to go other places if needed.  Patient expressed that he has Medicaid and has applied for disability, but has not been approved yet.  Patient is hopeful for a SNF bed placement for  short term rehab.  Patient was asked if he had any other questions, and patient stated he was hoping he could stay until after Christmas.  CSW explained to patient it is up to physician to determine when he is ready for discharge.  Patient expressed gratitude for social working trying to find patient a SNF to discharge to.   Employment status:  Unemployed Health and safety inspector:  Medicaid In [place] PT Recommendations:  Skilled Nursing Facility Information / Referral to community resources:  Skilled Nursing Facility  Patient/Family's Response to care:  Patient in agreement to going SNF for short term rehab.  Patient/Family's Understanding of and Emotional Response to Diagnosis, Current Treatment, and Prognosis:  Patient is aware of current diagnosis and treatment plan  Emotional Assessment Appearance:  Appears stated age Attitude/Demeanor/Rapport:  Unable to Assess Affect (typically observed):  Appropriate, Pleasant Orientation:  Oriented to  Time, Oriented to Place, oriented to situation, oriented to person Alcohol / Substance use:  Illicit Drugs, tobacco use Psych involvement (Current and /or in the community):  No (Comment)  Discharge Needs  Concerns to be addressed:  Lack of Support Readmission within the last 30 days:  No Current discharge risk:  None, Lack of support system Barriers to Discharge:  Homeless with medical needs   Devanie Galanti, Ervin Knack,  LCSWA [phone]

## 2015-11-04 NOTE — Care Management Note (Signed)
Case Management Note  Patient Details  Name: Wyline MoodJackie R Bazen MRN: [id number] Date of Birth: [birth year]  Subjective/Objective:       S/p right below the knee amputation             Action/Plan: PT/OT recommending SNF. Referral made to CSW.  Expected Discharge Date:                  Expected Discharge Plan:  Skilled Nursing Facility  In-House Referral:  Clinical Social Work  Discharge planning Services  CM Consult  Post Acute Care Choice:    Choice offered to:     DME Arranged:    DME Agency:     HH Arranged:    HH Agency:     Status of Service:  In process, will continue to follow  Medicare Important Message Given:    Date Medicare IM Given:    Medicare IM give by:    Date Additional Medicare IM Given:    Additional Medicare Important Message give by:     If discussed at Long Length of Stay Meetings, dates discussed:    Additional Comments:  Monica BectonKrieg, Atziri Zubiate Watson, RN 11/04/2015, 4:05 PM

## 2015-11-04 NOTE — Evaluation (Signed)
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  PMH significant for COPD, PLIF (10/15), peripheral neuropathy, LBP.    Clinical Impression  Patient presents s/p BKA with dependencies in mobility, limited by increased pain.  Patient overall did well with the mechanics of mobility, but still requires assistance for balance.  Patient will benefit from continued PT to progress mobility and increase independence.  Agree with SNF placement to progress mobility and ensure patient safety.      Follow Up Recommendations SNF    Equipment Recommendations  Rolling walker with 5" wheels    Recommendations for Other Services       Precautions / Restrictions Precautions Precautions: Fall Restrictions Weight Bearing Restrictions: No      Mobility  Bed Mobility Overal bed mobility: Modified Independent             General bed mobility comments: used railing  Transfers Overall transfer level: Needs assistance Equipment used: Rolling walker (2 wheeled) Transfers: Sit to/from Stand Sit to Stand: Min assist         General transfer comment: required cueing and education for hand placement, walker use  Ambulation/Gait Ambulation/Gait assistance: Min assist Ambulation Distance (Feet): 3 Feet Assistive device: Rolling walker (2 wheeled) Gait Pattern/deviations: Trunk flexed Gait velocity: decreased   General Gait Details: ambulates on lateral side of right foot.    Stairs            Wheelchair Mobility    Modified Rankin (Stroke Patients Only)       Balance Overall balance assessment: Needs assistance Sitting-balance support: No upper extremity supported Sitting balance-Leahy Scale: Good Sitting balance - Comments: able to don socks and shoes without asssitance or loss of balance   Standing  balance support: Bilateral upper extremity supported Standing balance-Leahy Scale: Poor Standing balance comment: requires use of RW for balance                             Pertinent Vitals/Pain Pain Assessment: 0-10 Pain Score: 7  Pain Location: left leg and back Pain Descriptors / Indicators: Burning;Aching;Discomfort;Throbbing Pain Intervention(s): Limited activity within patient's tolerance;Monitored during session;Repositioned;Patient requesting pain meds-RN notified    Home Living Family/patient expects to be discharged to:: Shelter/Homeless                 Additional Comments: plans are for discharge to SNF    Prior Function Level of Independence: Independent with assistive device(s)         Comments: uses rollator for ambulation     Hand Dominance        Extremity/Trunk Assessment   Upper Extremity Assessment: Defer to OT evaluation           Lower Extremity Assessment: Generalized weakness      Cervical / Trunk Assessment: Kyphotic  Communication   Communication: No difficulties  Cognition Arousal/Alertness: Awake/alert Behavior During Therapy: WFL for tasks assessed/performed Overall Cognitive Status: Within Functional Limits for tasks assessed                      General Comments      Exercises        Assessment/Plan    PT Assessment Patient needs continued PT services  PT Diagnosis Difficulty walking;Generalized weakness;Acute pain   PT Problem List Decreased  strength;Decreased range of motion;Decreased activity tolerance;Decreased balance;Decreased mobility;Decreased knowledge of use of DME;Decreased knowledge of precautions;Pain;Decreased skin integrity  PT Treatment Interventions DME instruction;Gait training;Functional mobility training;Therapeutic activities;Therapeutic exercise;Wheelchair mobility training;Patient/family education;Balance training   PT Goals (Current goals can be found in the Care Plan  section) Acute Rehab PT Goals Patient Stated Goal: stop hurting so bad PT Goal Formulation: With patient Time For Goal Achievement: 11/18/15 Potential to Achieve Goals: Good    Frequency Min 3X/week   Barriers to discharge Other (comment) patient is homeless, plans to discharge to SNF    Co-evaluation               End of Session Equipment Utilized During Treatment: Gait belt Activity Tolerance: Patient limited by pain Patient left: in chair;with nursing/sitter in room;with call bell/phone within reach Nurse Communication: Mobility status (communicated to nurse tech)         Time: [id number] PT Time Calculation (min) (ACUTE ONLY): 23 min   Charges:   PT Evaluation $Initial PT Evaluation Tier I: 1 Procedure PT Treatments $Gait Training: 8-22 mins   PT G CodesOlivia Canter:        Mayank Teuscher M 11/04/2015, 11:30 AM  11/04/2015 Corlis HoveMargie Ryleah Miramontes, PT [phone]

## 2015-11-04 NOTE — Progress Notes (Signed)
On entering patients room, there was smoke in the room and it smelled like cigarette. Patient was asked if he was smoking in the room and he said no but his visitors smoked before coming to visit and that was why the room was smelling with cigarette. Later, the butt of cigarette was found in the trash with brown substance that looks like tobacco. When patient was asked if he was smoking in the room, he replied no, he wasn't smoking and he will never smoke in the room. He added, he opens the cigarette and chews the tobacco.

## 2015-11-04 NOTE — Progress Notes (Signed)
Patients left eye cloudy.  Patient states he has ulcer in his eye

## 2015-11-05 MED ORDER — OXYCODONE HCL 5 MG PO TABS
30.0000 mg | ORAL_TABLET | Freq: Four times a day (QID) | ORAL | Status: DC | PRN
  Administered 2015-11-05 – 2015-11-08 (×12): 30 mg via ORAL
  Filled 2015-11-05 (×12): qty 6

## 2015-11-05 MED ORDER — PREGABALIN 75 MG PO CAPS
75.0000 mg | ORAL_CAPSULE | Freq: Two times a day (BID) | ORAL | Status: DC
  Administered 2015-11-05 – 2015-11-07 (×5): 75 mg via ORAL
  Filled 2015-11-05 (×5): qty 1

## 2015-11-05 NOTE — Patient Instructions (Signed)
Quad Set / Hip Extension ° ° °With towel roll under calf of residual limb, tighten thigh muscle to straighten knee. Push down into towel roll to lift buttocks. °Hold 3 seconds. Repeat 10 times. Do 2 sessions per day. ° ° ° °Hip Adduction ° ° °With towel roll between thighs, gently squeeze thighs together and down. °Hold 3 seconds. Repeat 10 times. Do 2 sessions per day. ° ° ° ° ° ° ° ° ° ° ° °Knee AROM: Flexion / Extension ° ° °With towel roll behind knee, gently bend and straighten knee over the towel roll. °Repeat 10 times. Do 2 sessions per day. ° ° ° °Hip / Knee AROM: Abduction / Adduction ° ° °Roll to sound side. Lift residual limb straight up and down while keeping hip straight. °Repeat 10 times. Do 2 sessions per day. ° ° ° ° ° ° ° ° °  °Hip / Knee AROM: Flexion / Extension ° ° °Roll to sound side. Bring knee to chest while bending knee. Reach limb back as far as possible while straightening knee. °Repeat 10 times. Do 2 sessions per day. ° °Copyright © [employer]. All rights reserved.  ° ° °

## 2015-11-05 NOTE — Progress Notes (Signed)
Patient's pain meds adjusted Dressing dry He is stable for dc to SNF when bed available  N. Glee ArvinMichael Xu, MD [facility] [phone] 7:32 AM

## 2015-11-05 NOTE — [place] FL2 (Addendum)
[place] MEDICAID FL2 LEVEL OF CARE SCREENING TOOL     IDENTIFICATION  Patient Name: Ethan [name] Birthdate: April 19, 1961 Sex: male Admission Date (Current Location): 11/02/2015  [place] and [place] Number:  [name] [id number] [address] Facility and Address:  [facility] [place], [address] [place], [place] [address]      Provider Number: [id number]  Attending Physician Name and Address:  Ethan Mustard, MD  Relative Name and Phone Number:  Ethan [name] [phone] or Ethan [name], Brother [phone]    Current Level of Care: Hospital Recommended Level of Care: Skilled Nursing Facility Prior Approval Number:    Date Approved/Denied:   PASRR Number:  [id number]  Discharge Plan: SNF    Current Diagnoses: Patient Active Problem List   Diagnosis Date Noted  . Below knee amputation status (HCC) 11/03/2015  . Protein-calorie malnutrition, severe (HCC) 06/21/2015  . Cellulitis and abscess of foot 06/20/2015  . Foot ulcer, left (HCC) 06/20/2015  . Chronic lower back pain 06/20/2015  . Hypertension 06/20/2015  . Heroin use 06/20/2015  . Acute respiratory failure (HCC) 08/29/2014  . Brain mass 08/29/2014  . Community acquired pneumonia 08/24/2014  . Sepsis (HCC) 08/24/2014  . Bilateral leg edema 08/24/2014  . CAP (community acquired pneumonia) 08/24/2014  . Acute encephalopathy 08/24/2014  . Anemia 08/24/2014    Orientation RESPIRATION BLADDER Height & Weight    Self, Time, Situation, Place  Normal Continent  (180.3 cm) 132 lbs.  BEHAVIORAL SYMPTOMS/MOOD NEUROLOGICAL BOWEL NUTRITION STATUS      Continent Diet (Regular)  AMBULATORY STATUS COMMUNICATION OF NEEDS Skin   Limited Assist Verbally PU Stage and Appropriate Care, Surgical wounds     PU Stage 3 Dressing: Daily                 Personal Care Assistance Level of Assistance  Bathing, Dressing Bathing Assistance: Limited assistance   Dressing Assistance: Limited  assistance     Functional Limitations Info             SPECIAL CARE FACTORS FREQUENCY  PT (By licensed PT)     PT Frequency: 5x a week              Contractures      Additional Factors Info  Code Status, Allergies Code Status Info: Full Code Allergies Info: Motrin           Current Medications (11/05/2015):  This is the current hospital active medication list Current Facility-Administered Medications  Medication Dose Route Frequency Provider Last Rate Last Dose  . 0.9 %  sodium chloride infusion   Intravenous Continuous Ethan Mustard, MD 10 mL/hr at 11/03/15 1426    . acetaminophen (TYLENOL) tablet 650 mg  650 mg Oral Q6H PRN Ethan Mustard, MD       Or  . acetaminophen (TYLENOL) suppository 650 mg  650 mg Rectal Q6H PRN Ethan Mustard, MD      . aspirin EC tablet 325 mg  325 mg Oral Daily Ethan Mustard, MD   325 mg at 11/04/15 1022  . docusate sodium (COLACE) capsule 100 mg  100 mg Oral BID Ethan Mustard, MD   100 mg at 11/04/15 2105  . gabapentin (NEURONTIN) capsule 300 mg  300 mg Oral TID Ethan Mustard, MD   300 mg at 11/04/15 2105  . HYDROmorphone (DILAUDID) injection 2 mg  2 mg Intravenous Q2H PRN Tarry Kos, MD   2 mg at 11/04/15 2107  .  ketorolac (TORADOL) 30 MG/ML injection 30 mg  30 mg Intravenous Q6H PRN Tarry KosNaiping M Xu, MD   30 mg at 11/04/15 1518  . methocarbamol (ROBAXIN) tablet 500 mg  500 mg Oral Q6H PRN Ethan MustardMarcus Duda V, MD   500 mg at 11/04/15 1432   Or  . methocarbamol (ROBAXIN) 500 mg in dextrose 5 % 50 mL IVPB  500 mg Intravenous Q6H PRN Ethan MustardMarcus Duda V, MD   500 mg at 11/03/15 1202  . metoCLOPramide (REGLAN) tablet 5-10 mg  5-10 mg Oral Q8H PRN Ethan MustardMarcus Duda V, MD       Or  . metoCLOPramide (REGLAN) injection 5-10 mg  5-10 mg Intravenous Q8H PRN Ethan MustardMarcus Duda V, MD      . ondansetron [facility](ZOFRAN) tablet 4 mg  4 mg Oral Q6H PRN Ethan MustardMarcus Duda V, MD       Or  . ondansetron [facility](ZOFRAN) injection 4 mg  4 mg Intravenous Q6H PRN Ethan MustardMarcus Duda V, MD      . oxyCODONE (Oxy  IR/ROXICODONE) immediate release tablet 20 mg  20 mg Oral Q3H PRN Ethan MustardMarcus Duda V, MD   20 mg at 11/05/15 0032  . polyethylene glycol (MIRALAX / GLYCOLAX) packet 17 g  17 g Oral Daily PRN Ethan MustardMarcus Duda V, MD         Discharge Medications: Please see discharge summary for a list of discharge medications.  Relevant Imaging Results:  Relevant Lab Results:   Additional Information SSN [id number] patient is currently homeless  Ethan [name], Ethan [name], [address]CSWA

## 2015-11-05 NOTE — Clinical Social Work Note (Signed)
Barrier to Discharge: [place] MUST Level II PASARR currently under manual review. [place] MUST will be closed/ unable to review PASARR until December 27th. Patient currently does NOT have bed offers for SNF.   [place] MUST requiring H/P, FL-2, and 30 day note before review. 30 day note to be placed on chart for MD signature.   CSW remains available as needed.   Derenda FennelBashira Cass Vandermeulen, MSW, LCSWA [phone] 11/05/2015 8:44 AM

## 2015-11-05 NOTE — Progress Notes (Signed)
[facility] noticed a lump on Pt Left lower back. I asked pt if the lump is something new. Pt stated "it had been there for 3 years and it's a broken rod from when I fell". Will continue to monitor

## 2015-11-05 NOTE — Progress Notes (Signed)
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  PMH significant for COPD, PLIF (10/15), peripheral neuropathy, LBP.      PT Comments    Patient steadily progressing.  Still limited by severe pain.  Did will with exercises demonstrating good strength and ROM.  Patient's independence in mobility limited by unusual gait pattern - premorbidly patient ambulated on lateral edge of right foot.  Will need continued PT for ambulation/mobility progression and strenght/ROM training.  Follow Up Recommendations  SNF     Equipment Recommendations  Rolling walker with 5" wheels    Recommendations for Other Services       Precautions / Restrictions Precautions Precautions: Fall    Mobility  Bed Mobility Overal bed mobility: Modified Independent                Transfers Overall transfer level: Needs assistance Equipment used: Rolling walker (2 wheeled) Transfers: Sit to/from Stand Sit to Stand: Min assist         General transfer comment: facilitation for power up to standing and to regain balance in standing  Ambulation/Gait Ambulation/Gait assistance: Min assist Ambulation Distance (Feet): 10 Feet Assistive device: Rolling walker (2 wheeled) Gait Pattern/deviations: Trunk flexed Gait velocity: decreased Gait velocity interpretation: Below normal speed for age/gender General Gait Details: ambulates on lateral side of right foot.     Stairs            Wheelchair Mobility    Modified Rankin (Stroke Patients Only)       Balance Overall balance assessment: Needs assistance Sitting-balance support: No upper extremity supported Sitting balance-Leahy Scale: Normal     Standing balance support: Bilateral upper extremity supported Standing balance-Leahy Scale: Poor Standing balance  comment: requires RW for balance                    Cognition Arousal/Alertness: Awake/alert Behavior During Therapy: WFL for tasks assessed/performed Overall Cognitive Status: Within Functional Limits for tasks assessed                      Exercises Amputee Exercises Hip Extension: AROM;Left;10 reps;Supine Hip ABduction/ADduction: AROM;Left;10 reps;Supine Hip Flexion/Marching: AROM;Left;10 reps;Supine Knee Flexion: AROM;Left;10 reps;Supine Straight Leg Raises: AROM;Left;10 reps;Supine    General Comments        Pertinent Vitals/Pain Pain Assessment: 0-10 Pain Score: 9  Pain Location: Left leg Pain Descriptors / Indicators: Aching;Grimacing;Spasm;Sore Pain Intervention(s): Limited activity within patient's tolerance;Monitored during session;Patient requesting pain meds-RN notified    Home Living                      Prior Function            PT Goals (current goals can now be found in the care plan section) Progress towards PT goals: Progressing toward goals    Frequency  Min 3X/week    PT Plan Current plan remains appropriate    Co-evaluation             End of Session Equipment Utilized During Treatment: Gait belt Activity Tolerance: Patient limited by pain Patient left: in chair;with call bell/phone within reach     Time: 1202-1224 PT Time Calculation (min) (ACUTE ONLY): 22 min  Charges:  $Therapeutic Exercise: 8-22 mins  G Codes:      Ethan [name], Ethan [name] 11/05/2015, 2:56 PM  11/05/2015 Ethan [name], PT [phone]

## 2015-11-05 NOTE — Clinical Social Work Note (Signed)
30-day note for [place] MUST PASARR review placed on chart for MD signature.   CSW remains available as needed.  Derenda FennelBashira Amar Sippel, MSW, LCSWA [phone] 11/05/2015 11:09 AM

## 2015-11-05 NOTE — Progress Notes (Signed)
Patient called for assistance and this writer went in to assist him. On entering the room, some one shouted from the bathroom asking "where is the towel". This Clinical research associatewriter asked patient who was in the bathroom and he said, it was his girlfriend in there, taking a shower and she needs towel. He added, they are homeless and they clean up at every opportunity they get. Towels were provided to patient and he gave them to her.

## 2015-11-06 MED ORDER — LORAZEPAM 1 MG PO TABS
1.0000 mg | ORAL_TABLET | Freq: Once | ORAL | Status: AC
  Administered 2015-11-06: 1 mg via ORAL
  Filled 2015-11-06: qty 1

## 2015-11-06 NOTE — Progress Notes (Signed)
MD on call informed that pt c/o his pain not being managed received new orders and will continue to monitor. Ilean SkillVeronica Kirat Mezquita LPN

## 2015-11-06 NOTE — Progress Notes (Signed)
Provided Pt with his bed offer [facility] [place]  Awaiting PASAAR.  Providence CrosbyAmanda Zaylei Mullane, LCSW Clinical Social Work [phone]

## 2015-11-07 MED ORDER — PREGABALIN 100 MG PO CAPS
100.0000 mg | ORAL_CAPSULE | Freq: Two times a day (BID) | ORAL | Status: DC
  Administered 2015-11-07 – 2015-11-10 (×6): 100 mg via ORAL
  Filled 2015-11-07 (×6): qty 1

## 2015-11-07 MED ORDER — HYDROMORPHONE HCL 2 MG PO TABS
2.0000 mg | ORAL_TABLET | Freq: Four times a day (QID) | ORAL | Status: DC | PRN
Start: 2015-11-07 — End: 2015-11-10
  Administered 2015-11-07 – 2015-11-10 (×11): 4 mg via ORAL
  Filled 2015-11-07 (×12): qty 2

## 2015-11-08 MED ORDER — OXYCODONE HCL 5 MG PO TABS
30.0000 mg | ORAL_TABLET | ORAL | Status: DC | PRN
  Administered 2015-11-08 – 2015-11-10 (×12): 30 mg via ORAL
  Filled 2015-11-08 (×12): qty 6

## 2015-11-08 MED ORDER — LORAZEPAM 0.5 MG PO TABS
0.5000 mg | ORAL_TABLET | Freq: Two times a day (BID) | ORAL | Status: DC | PRN
  Administered 2015-11-08 – 2015-11-10 (×3): 0.5 mg via ORAL
  Filled 2015-11-08 (×4): qty 1

## 2015-11-08 NOTE — Progress Notes (Signed)
     Subjective: 5 Days Post-Op Procedure(s) (LRB): Left Below Knee Amputation (Left) Awake, alert and oriented x 4. States that he has chronic pain, takes Roxicodone 20mg  at a time at home, here on 20 mg every 6 hours, he would like it more frequently. Also notes that he take ativan 0.5-1 mg every 12 hours for nerve Pain, Neurontin, gabapentin and lyrica do not help.  Patient reports pain as mild.    Objective:   VITALS:  Temp:  [98.5 F (36.9 C)-98.9 F (37.2 C)] 98.5 F (36.9 C) (12/26 0513) Pulse Rate:  [72-77] 72 (12/26 0513) Resp:  [17-18] 18 (12/26 0513) BP: (100-129)/(58-82) 129/82 mmHg (12/26 0513) SpO2:  [98 %-100 %] 98 % (12/26 0513)  Neurologically intact ABD soft Neurovascular intact Sensation intact distally Incision: dressing C/D/I   LABS No results for input(s): HGB, WBC, PLT in the last 72 hours. No results for input(s): NA, K, CL, CO2, BUN, CREATININE, GLUCOSE in the last 72 hours. No results for input(s): LABPT, INR in the last 72 hours.   Assessment/Plan: 5 Days Post-Op Procedure(s) (LRB): Left Below Knee Amputation (Left)  Advance diet Up with therapy Discharge to SNF  He has no help at home and lives alone. With left BKA will need SNF until he is more independent. Nursing notes that he has had significant history of drug use.  Tytiana Coles E 11/08/2015, 11:20 AM

## 2015-11-08 NOTE — Progress Notes (Signed)
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  PMH significant for COPD, PLIF (10/15), peripheral neuropathy, LBP.      PT Comments    Patient continues to progress with mobility and independence.  Not as much pain today.  Overall, feel patient will be w/c bound and be able to ambulate short distances due to back pain and mechanics of right foot (ambulating on the outside of foot).  Doubt patient with have endurance or balance to ambulate long distances, unless he is able to get prosthesis.  Will benefit from continued to PT to address mobility, instruct in w/c use and facilitate strengthening and ROM of left LE.    Follow Up Recommendations  SNF     Equipment Recommendations  Rolling walker with 5" wheels    Recommendations for Other Services       Precautions / Restrictions Precautions Precautions: Fall Restrictions RLE Weight Bearing: Non weight bearing    Mobility  Bed Mobility               General bed mobility comments: Pt OOB in chair  Transfers Overall transfer level: Needs assistance Equipment used: Rolling walker (2 wheeled) Transfers: Sit to/from Stand Sit to Stand: Min guard         General transfer comment: able to reach upright without assistance, min guard for safety/balance  Ambulation/Gait Ambulation/Gait assistance: Min assist Ambulation Distance (Feet): 20 Feet Assistive device: Rolling walker (2 wheeled) Gait Pattern/deviations: Trunk flexed Gait velocity: decreased   General Gait Details: ambulates on lateral side of right foot.  Min assist for balance as balance can be thrown off by ambulating on side of foot.   Stairs            Wheelchair Mobility    Modified Rankin (Stroke Patients Only)       Balance Overall balance assessment: Needs  assistance         Standing balance support: Bilateral upper extremity supported Standing balance-Leahy Scale: Poor Standing balance comment: requires RW for balance in standing                     Cognition Arousal/Alertness: Awake/alert Behavior During Therapy: WFL for tasks assessed/performed Overall Cognitive Status: Within Functional Limits for tasks assessed                      Exercises Amputee Exercises Hip Extension: AROM;Left;10 reps;Seated Hip ABduction/ADduction: AROM;Left;10 reps;Seated Hip Flexion/Marching: AROM;Left;10 reps;Seated Knee Flexion: AROM;Left;10 reps;Seated Knee Extension: AROM;Left;10 reps;Seated Straight Leg Raises: AROM;Left;10 reps;Seated    General Comments        Pertinent Vitals/Pain Pain Assessment: 0-10 Pain Score: 4  Pain Location: left leg Pain Descriptors / Indicators: Discomfort Pain Intervention(s): Monitored during session    Home Living                      Prior Function            PT Goals (current goals can now be found in the care plan section) Progress towards PT goals: Progressing toward goals    Frequency  Min 3X/week    PT Plan Current plan remains appropriate    Co-evaluation             End of Session Equipment Utilized During Treatment: Gait belt Activity Tolerance: Patient  tolerated treatment well;No increased pain Patient left: in chair;with call bell/phone within reach     Time: 1152-1210 PT Time Calculation (min) (ACUTE ONLY): 18 min  Charges:  $Therapeutic Activity: 8-22 mins                    G Codes:      Olivia Canter 2015/12/05, 12:14 PM  12/05/2015 Corlis Hove, PT [phone]

## 2015-11-09 NOTE — Progress Notes (Signed)
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  PMH significant for COPD, PLIF (10/15), peripheral neuropathy, LBP.     OT comments  Pt making progress toward OT goals. Able to stand at sink to complete grooming tasks with min assist provided for balance and pt leaning with forearms on sink. Pt able to don shoe with min guard for balance sitting EOB. Continues to require min assist for functional mobility in bedroom and bathroom. Will continue to follow acutely.    Follow Up Recommendations  SNF;Supervision/Assistance - 24 hour    Equipment Recommendations  Other (comment) (TBD)    Recommendations for Other Services      Precautions / Restrictions Precautions Precautions: Fall Restrictions Weight Bearing Restrictions: Yes RLE Weight Bearing: Non weight bearing       Mobility Bed Mobility Overal bed mobility: Modified Independent                Transfers Overall transfer level: Needs assistance Equipment used: Rolling walker (2 wheeled) Transfers: Sit to/from Stand Sit to Stand: Min guard         General transfer comment: Min guard for safety with sit to stand. VC for hand placement    Balance Overall balance assessment: Needs assistance Sitting-balance support: No upper extremity supported Sitting balance-Leahy Scale: Fair Sitting balance - Comments: to don shoe sitting EOB   Standing balance support: During functional activity;No upper extremity supported Standing balance-Leahy Scale: Poor Standing balance comment: Leaning on sink for support while standing at sink to complete grooming tasks                   ADL Overall ADL's : Needs assistance/impaired     Grooming: Minimal assistance;Standing;Wash/dry hands;Wash/dry face;Oral care Grooming Details (indicate cue  type and reason): Min assist for balance in standing.             Lower Body Dressing: Min guard Lower Body Dressing Details (indicate cue type and reason): pt able to don R shoe sitting EOB. Min guard given for balance in sitting.             Functional mobility during ADLs: Minimal assistance;Rolling walker General ADL Comments: No family present during OT session. Pt able to stand at sink for 7-10 minutes to complete grooming tasks.      Vision                     Perception     Praxis      Cognition   Behavior During Therapy: WFL for tasks assessed/performed Overall Cognitive Status: Within Functional Limits for tasks assessed                       Extremity/Trunk Assessment               Exercises     Shoulder Instructions       General Comments      Pertinent Vitals/ Pain       Pain Assessment: 0-10 Pain Score: 9  Pain Location: L leg (phantom pain) Pain Descriptors / Indicators: Aching;Grimacing;Guarding Pain Intervention(s): Limited activity within patient's tolerance;Monitored during session;Repositioned;Patient requesting pain meds-RN notified;RN gave pain meds during session  Home Living  Prior Functioning/Environment              Frequency Min 2X/week     Progress Toward Goals  OT Goals(current goals can now be found in the care plan section)  Progress towards OT goals: Progressing toward goals  Acute Rehab OT Goals Patient Stated Goal: reduce pain OT Goal Formulation: With patient  Plan Discharge plan remains appropriate    Co-evaluation                 End of Session Equipment Utilized During Treatment: Gait belt;Rolling walker   Activity Tolerance Patient tolerated treatment well   Patient Left in chair;with call bell/phone within reach   Nurse Communication Patient requests pain meds        Time: 1610-9604 OT Time Calculation  (min): 23 min  Charges: OT General Charges $OT Visit: 1 Procedure OT Treatments $Self Care/Home Management : 23-37 mins  Gaye Alken M.S., OTR/L Pager: [phone]  11/09/2015, 10:44 AM

## 2015-11-09 NOTE — Progress Notes (Signed)
Patient ID: Ethan [name], male   DOB: 1961-03-17, 54 y.o.   MRN: [id number] PASARR note has been signed.  Will be able to be discharged to facility tomorrow 12/28.  Left BKA dressing looks great.  Overall, patient is stable.

## 2015-11-09 NOTE — Clinical Social Work Note (Signed)
30 day note for PASARR remains on patient's chart for MD signature. [place] (PAS[place]) closed on 11/09/2015.  Patient can not discharge to SNF until PASARR authorized.  Marcelline Deistmily Xzavior Reinig, LCSW [phone] Orthopedics: [id number] Surgical: [phone]

## 2015-11-10 MED ORDER — OXYCODONE HCL 30 MG PO TABS: 30.0000 mg | ORAL_TABLET | ORAL | Status: DC | PRN

## 2015-11-10 NOTE — Progress Notes (Signed)
Ethan [name] discharged to SNF [facility] per MD order. All questions and concerns answered. Copy of instructions and scripts sent with patient. Patient's walker could not be transported with patient. Blue and black walker is labeled with patient's name and placed in dirty utility on 5N. [facility] or have someone pick up the walker for him. [name] at [phone] to give report. No response. No answering machine. Will wait for a call from SNF to give report.  [facility] No distress noted upon discharge.   Lorin PicketScott, [place]any R 11/10/2015 3:10 PM

## 2015-11-10 NOTE — Discharge Summary (Signed)
Physician Discharge Summary  Patient ID: KEONA SHEFFLER MRN: [id number] DOB/AGE: May 27, 1961 54 y.o.  Admit date: 11/02/2015 Discharge date: 11/10/2015  Admission Diagnoses: Abscess osteomyelitis left lower extremity  Discharge Diagnoses:  Active Problems:   Below knee amputation status [facility])   Discharged Condition: stable  Hospital Course: Patient's hospital course was essentially unremarkable. He underwent a transtibial amputation. Postoperatively patient was unable to be independently discharged patient did not have a home. Patient required prolonged stay for paperwork completion for discharge to skilled nursing.  Consults: None  Significant Diagnostic Studies: labs: Routine labs  Treatments: surgery: See operative note  Discharge Exam: Blood pressure 121/71, pulse 83, temperature 98.5 F (36.9 C), temperature source Oral, resp. rate 16, height  (1.803 m), weight 60.3 kg (132 lb 15 oz), SpO2 98 %. Incision/Wound: dressing clean and dry  Disposition: 01-Home or Self Care  Discharge Instructions    Call MD / Call 911    Complete by:  As directed   If you experience chest pain or shortness of breath, CALL 911 and be transported to the hospital emergency room.  If you develope a fever above 101 F, pus (white drainage) or increased drainage or redness at the wound, or calf pain, call your surgeon's office.     Call MD / Call 911    Complete by:  As directed   If you experience chest pain or shortness of breath, CALL 911 and be transported to the hospital emergency room.  If you develope a fever above 101.5 F, pus (white drainage) or increased drainage or redness at the wound, or calf pain, call your surgeon's office.     Call MD / Call 911    Complete by:  As directed   If you experience chest pain or shortness of breath, CALL 911 and be transported to the hospital emergency room.  If you develope a fever above 101 F, pus (white drainage) or increased drainage or redness  at the wound, or calf pain, call your surgeon's office.     Constipation Prevention    Complete by:  As directed   Drink plenty of fluids.  Prune juice may be helpful.  You may use a stool softener, such as Colace (over the counter) 100 mg twice a day.  Use MiraLax (over the counter) for constipation as needed.     Constipation Prevention    Complete by:  As directed   Drink plenty of fluids.  Prune juice may be helpful.  You may use a stool softener, such as Colace (over the counter) 100 mg twice a day.  Use MiraLax (over the counter) for constipation as needed.     Constipation Prevention    Complete by:  As directed   Drink plenty of fluids.  Prune juice may be helpful.  You may use a stool softener, such as Colace (over the counter) 100 mg twice a day.  Use MiraLax (over the counter) for constipation as needed.     Diet - low sodium heart healthy    Complete by:  As directed      Diet - low sodium heart healthy    Complete by:  As directed      Diet - low sodium heart healthy    Complete by:  As directed      Diet general    Complete by:  As directed      Driving restrictions    Complete by:  As directed   No driving while  taking narcotic pain meds.     Increase activity slowly as tolerated    Complete by:  As directed      Increase activity slowly as tolerated    Complete by:  As directed      Increase activity slowly as tolerated    Complete by:  As directed             Medication List    TAKE these medications        acetaminophen 500 MG tablet  Commonly known as:  TYLENOL  Take 1 tablet (500 mg total) by mouth every 6 (six) hours as needed.     lisinopril 10 MG tablet  Commonly known as:  PRINIVIL,ZESTRIL  Take 20 mg by mouth daily. Reported on 11/02/2015     LORazepam 0.5 MG tablet  Commonly known as:  ATIVAN  Take 0.25 mg by mouth every 8 (eight) hours as needed for anxiety. Reported on 11/02/2015     oxycodone 30 MG immediate release tablet  Commonly known  as:  ROXICODONE  Take 30 mg by mouth 4 (four) times daily. Reported on 11/02/2015     oxycodone 30 MG immediate release tablet  Commonly known as:  ROXICODONE  Take 1 tablet (30 mg total) by mouth every 4 (four) hours as needed (pain).     oxyCODONE-acetaminophen 5-325 MG tablet  Commonly known as:  ROXICET  Take 1 tablet by mouth every 4 (four) hours as needed for severe pain.           Follow-up Information    Follow up with DUDA,MARCUS V, MD In 2 weeks.   Specialty:  Orthopedic Surgery   Contact information:   [address] [place] [address] [phone]       Signed: Nadara MustardDUDA,MARCUS V 11/10/2015, 6:27 AM

## 2015-11-10 NOTE — Clinical Social Work Note (Signed)
Patient to be d[facility] today [facility] [place].  Patient and family agreeable to plans will transport via ems RN to call report to [phone]   Windell MouldingEric Karynn Deblasi, MSW, Theresia MajorsLCSWA [phone]

## 2015-11-10 NOTE — Progress Notes (Signed)
PT Cancellation Note  Patient Details Name: Ethan MoodJackie R Baus MRN: [id number] DOB: Mar 04, 1961   Cancelled Treatment:    Reason Eval/Treat Not Completed: Patient declined, no reason specified Pt reported that his pain is too bad to move right now. Repositioned pt's L LE for more comfortable position in bed. Pt d/c to SNF today.    Derek MoundKellyn R Yides Saidi Tyleigh Mahn, PTA Pager: [phone]   11/10/2015, 9:43 AM

## 2015-11-10 NOTE — Progress Notes (Signed)
Patient expressed complaints of cloudiness in left eye, and back pain related to broken screws and plates. Nurse also noticed urine was cloudy. MD aware. Patient should alert SNF on arrival so he can be seen by an Opthamologist, the patient already has a pain clinic for chronic back pain. MD states that urine is concentrated from lack of water intake. Will encourage patient to increase water intake.

## 2016-02-20 ENCOUNTER — Emergency Department (HOSPITAL_COMMUNITY): Payer: Medicaid Other

## 2016-02-20 ENCOUNTER — Emergency Department (HOSPITAL_COMMUNITY)
Admission: EM | Admit: 2016-02-20 | Discharge: 2016-02-20 | Disposition: A | Discharge status: Home / Self Care | Payer: Medicaid Other | Attending: Emergency Medicine | Admitting: Emergency Medicine

## 2016-02-20 ENCOUNTER — Encounter (HOSPITAL_COMMUNITY): Payer: Self-pay

## 2016-02-20 DIAGNOSIS — I10 Essential (primary) hypertension: Secondary | ICD-10-CM | POA: Insufficient documentation

## 2016-02-20 DIAGNOSIS — F17210 Nicotine dependence, cigarettes, uncomplicated: Secondary | ICD-10-CM | POA: Diagnosis not present

## 2016-02-20 DIAGNOSIS — G8929 Other chronic pain: Secondary | ICD-10-CM | POA: Diagnosis not present

## 2016-02-20 DIAGNOSIS — Z8701 Personal history of pneumonia (recurrent): Secondary | ICD-10-CM | POA: Insufficient documentation

## 2016-02-20 DIAGNOSIS — G4710 Hypersomnia, unspecified: Secondary | ICD-10-CM | POA: Insufficient documentation

## 2016-02-20 DIAGNOSIS — M19071 Primary osteoarthritis, right ankle and foot: Secondary | ICD-10-CM | POA: Insufficient documentation

## 2016-02-20 DIAGNOSIS — R531 Weakness: Secondary | ICD-10-CM | POA: Diagnosis present

## 2016-02-20 DIAGNOSIS — F419 Anxiety disorder, unspecified: Secondary | ICD-10-CM | POA: Insufficient documentation

## 2016-02-20 DIAGNOSIS — R400 Somnolence: Secondary | ICD-10-CM

## 2016-02-20 DIAGNOSIS — M19072 Primary osteoarthritis, left ankle and foot: Secondary | ICD-10-CM | POA: Diagnosis not present

## 2016-02-20 LAB — CBC WITH DIFFERENTIAL/PLATELET
BASOS PCT: 0 %
Basophils Absolute: 0 10*3/uL (ref 0.0–0.1)
Eosinophils Absolute: 0.3 10*3/uL (ref 0.0–0.7)
Eosinophils Relative: 3 %
HEMATOCRIT: 33.2 % — AB (ref 39.0–52.0)
HEMOGLOBIN: 11.2 g/dL — AB (ref 13.0–17.0)
LYMPHS PCT: 14 %
Lymphs Abs: 1.2 10*3/uL (ref 0.7–4.0)
MCH: 26.9 pg (ref 26.0–34.0)
MCHC: 33.7 g/dL (ref 30.0–36.0)
MCV: 79.6 fL (ref 78.0–100.0)
MONOS PCT: 11 %
Monocytes Absolute: 1 10*3/uL (ref 0.1–1.0)
NEUTROS ABS: 6.4 10*3/uL (ref 1.7–7.7)
Neutrophils Relative %: 72 %
Platelets: 213 10*3/uL (ref 150–400)
RBC: 4.17 MIL/uL — ABNORMAL LOW (ref 4.22–5.81)
RDW: 15.8 % — ABNORMAL HIGH (ref 11.5–15.5)
WBC: 8.9 10*3/uL (ref 4.0–10.5)

## 2016-02-20 LAB — BLOOD GAS, VENOUS
Acid-Base Excess: 2.5 mmol/L — ABNORMAL HIGH (ref 0.0–2.0)
BICARBONATE: 29.3 meq/L — AB (ref 20.0–24.0)
O2 Saturation: 55.1 %
PATIENT TEMPERATURE: 98.6
TCO2: 25.8 mmol/L (ref 0–100)
pCO2, Ven: 55.5 mmHg — ABNORMAL HIGH (ref 45.0–50.0)
pH, Ven: 7.343 — ABNORMAL HIGH (ref 7.250–7.300)
pO2, Ven: 32.9 mmHg (ref 31.0–45.0)

## 2016-02-20 LAB — COMPREHENSIVE METABOLIC PANEL
ALBUMIN: 3.4 g/dL — AB (ref 3.5–5.0)
ALK PHOS: 72 U/L (ref 38–126)
ALT: 16 U/L — AB (ref 17–63)
ANION GAP: 9 (ref 5–15)
AST: 29 U/L (ref 15–41)
BUN: 14 mg/dL (ref 6–20)
CALCIUM: 8.9 mg/dL (ref 8.9–10.3)
CHLORIDE: 99 mmol/L — AB (ref 101–111)
CO2: 28 mmol/L (ref 22–32)
Creatinine, Ser: 0.75 mg/dL (ref 0.61–1.24)
GFR calc Af Amer: >60 mL/min (ref >60)
GFR calc non Af Amer: >60 mL/min (ref >60)
GLUCOSE: 83 mg/dL (ref 65–99)
Potassium: 3.7 mmol/L (ref 3.5–5.1)
Sodium: 136 mmol/L (ref 135–145)
Total Bilirubin: 1.3 mg/dL — ABNORMAL HIGH (ref 0.3–1.2)
Total Protein: 7.4 g/dL (ref 6.5–8.1)

## 2016-02-20 LAB — CBG MONITORING, ED
GLUCOSE-CAPILLARY: 101 mg/dL — AB (ref 65–99)
Glucose-Capillary: 79 mg/dL (ref 65–99)

## 2016-02-20 LAB — AMMONIA: AMMONIA: 25 umol/L (ref 9–35)

## 2016-02-20 LAB — ETHANOL: Alcohol, Ethyl (B): <5 mg/dL (ref <5)

## 2016-02-20 NOTE — ED Notes (Signed)
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
## 2016-02-20 NOTE — ED Notes (Signed)
He remains in no distress and remains quite drowsy; falling asleep almost immediately whenever we are finished speaking with him.  He falls asleep sometimes during conversation also.

## 2016-02-20 NOTE — ED Notes (Signed)
He awakens and is very profane and is angry and states "I want all of my ativan and all the other medicines that were taken from me".  I assured him that neither I, nor anyone else here has taken anything from him; for indeed I have made a point of keeping all of his belongings (clothing; a w/c and a backpack) with him this entire E.D. Visit. A [employer] Officer is here speaking with him as I write this.

## 2016-02-20 NOTE — ED Notes (Signed)
Our CT tech. Sees him and upon being awakened, he makes it known he does not intend to undergo CT--he is quite adamant about this.  ~4 min. After this conversation he again falls soundly to sleep.  His skin continues to be normal warm and dry and he continues to breath normally.

## 2016-02-22 ENCOUNTER — Other Ambulatory Visit: Payer: Self-pay | Admitting: Adult Health

## 2016-02-24 ENCOUNTER — Other Ambulatory Visit: Payer: Self-pay | Admitting: Adult Health

## 2016-02-24 DIAGNOSIS — M545 Low back pain: Secondary | ICD-10-CM

## 2016-03-25 ENCOUNTER — Other Ambulatory Visit: Payer: Medicaid Other

## 2016-03-27 ENCOUNTER — Inpatient Hospital Stay: Admission: RE | Admit: 2016-03-27 | Payer: Medicaid Other | Source: Ambulatory Visit

## 2016-04-08 ENCOUNTER — Inpatient Hospital Stay: Admission: RE | Admit: 2016-04-08 | Payer: Medicaid Other | Source: Ambulatory Visit

## 2016-04-08 IMAGING — CR DG FOOT COMPLETE 3+V*L*
3 series · 3 of 3 positions shown · non-contrast
Comparison: 06/20/2015

CLINICAL DATA: Wound infection. Seen here on the fifteenth for bone
infection was discharged home with antibiotics. Wound is worsening.
Increased swelling.

EXAM:
LEFT FOOT - COMPLETE 3+ VIEW

[x foot ap left]
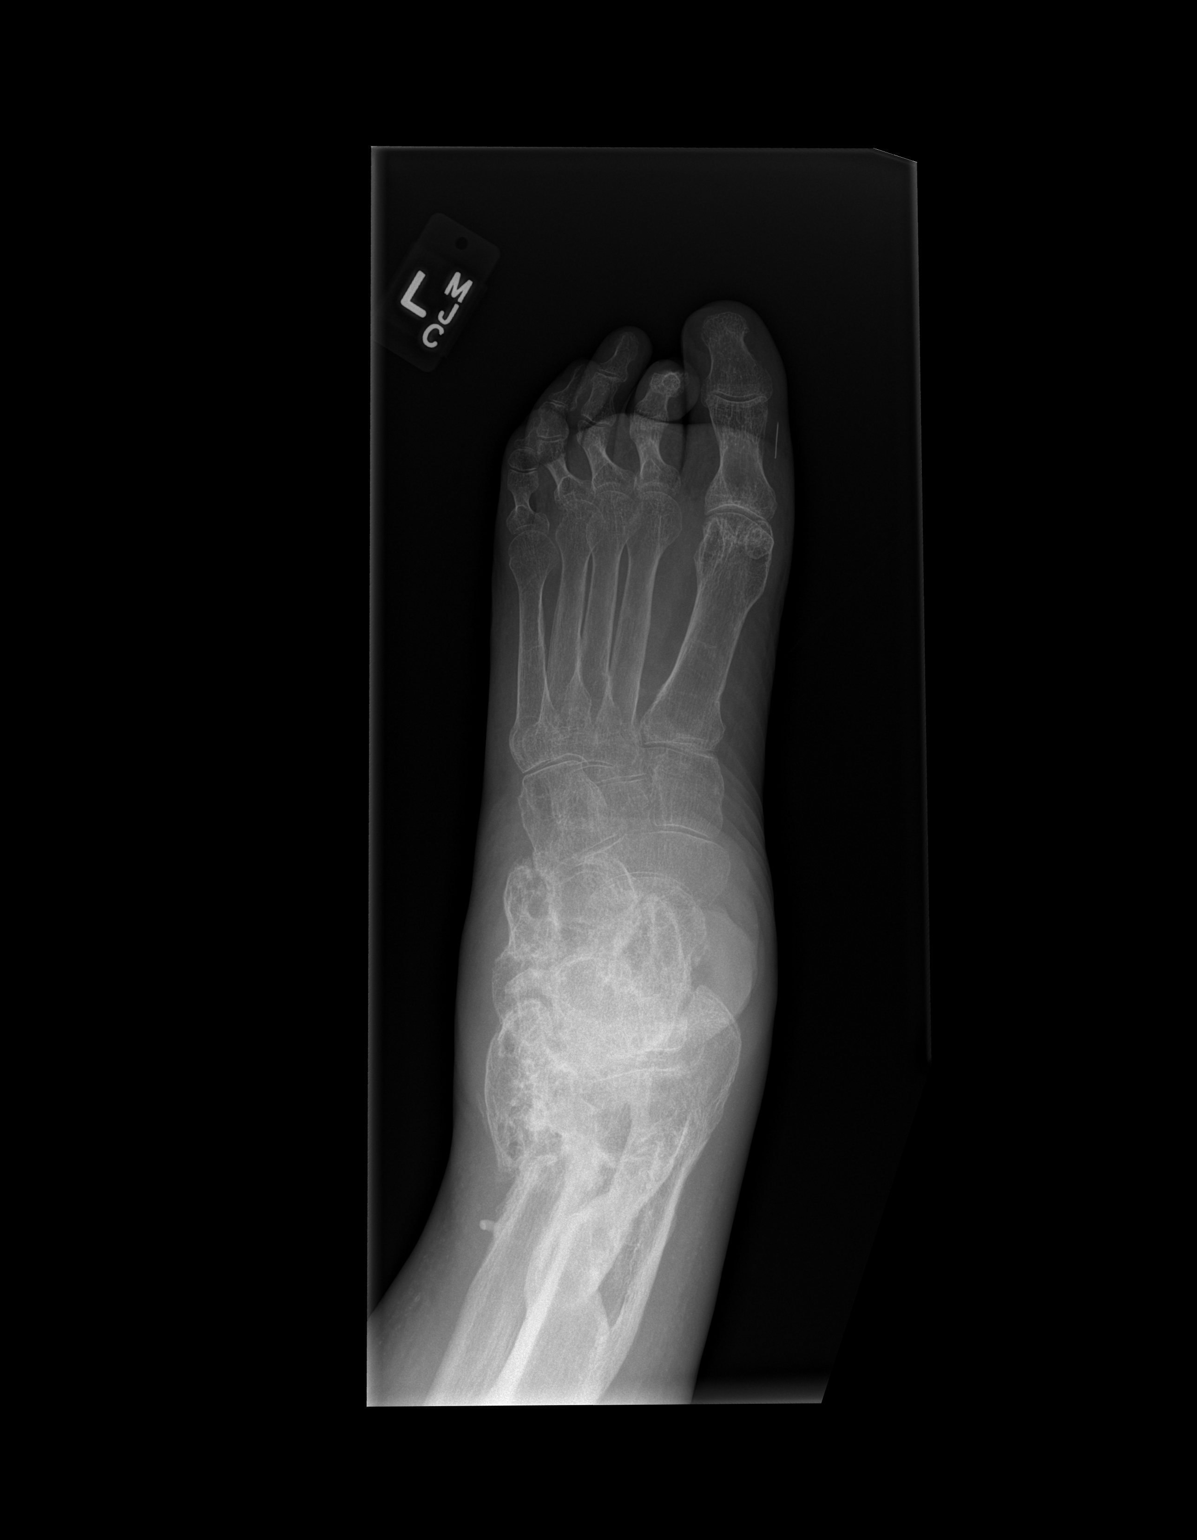

[x foot obl left]
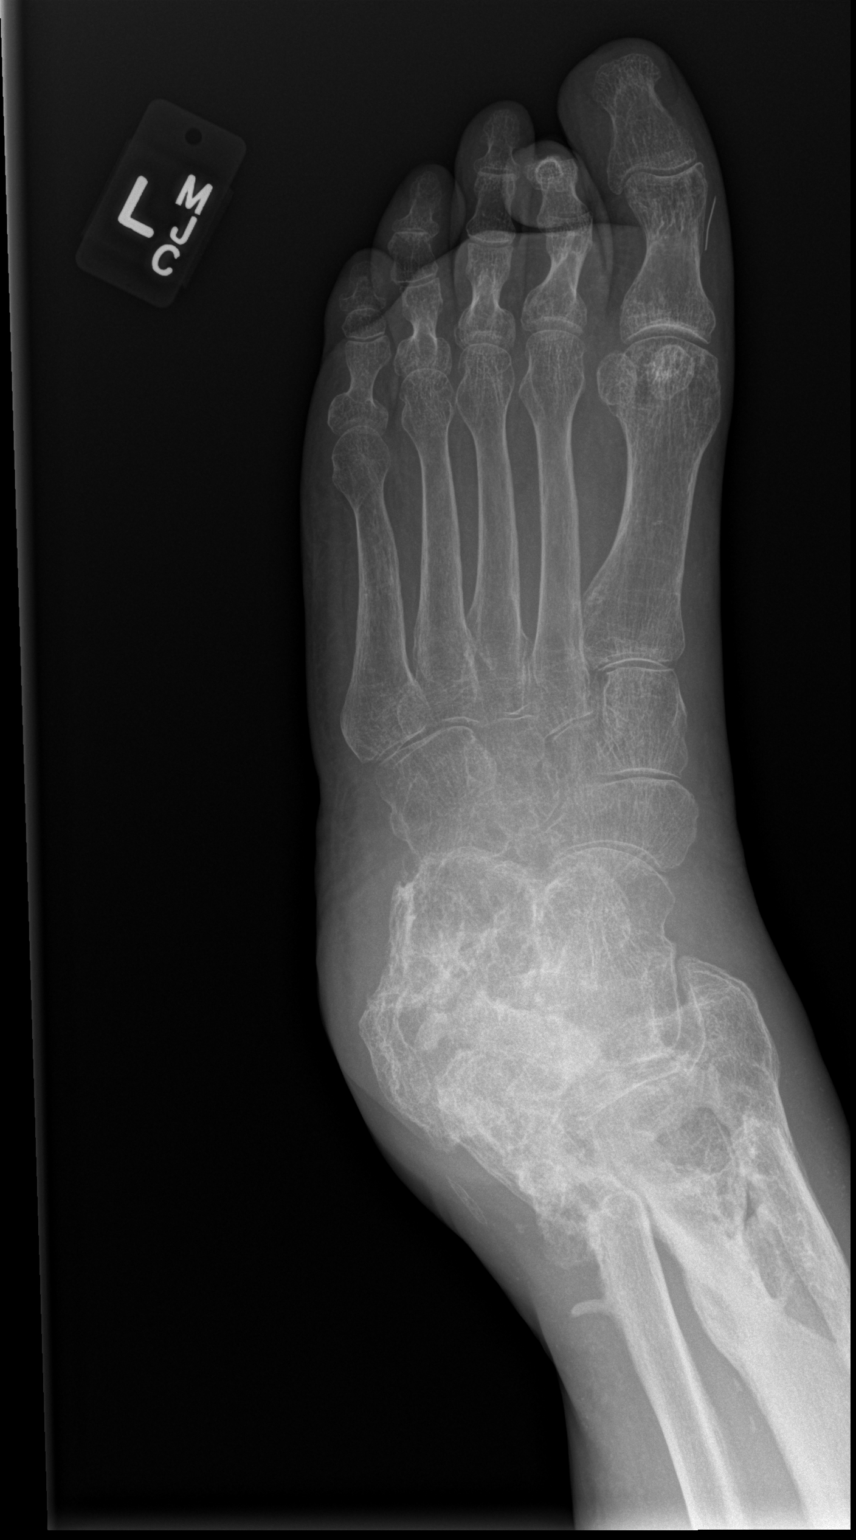

[x foot lat left]
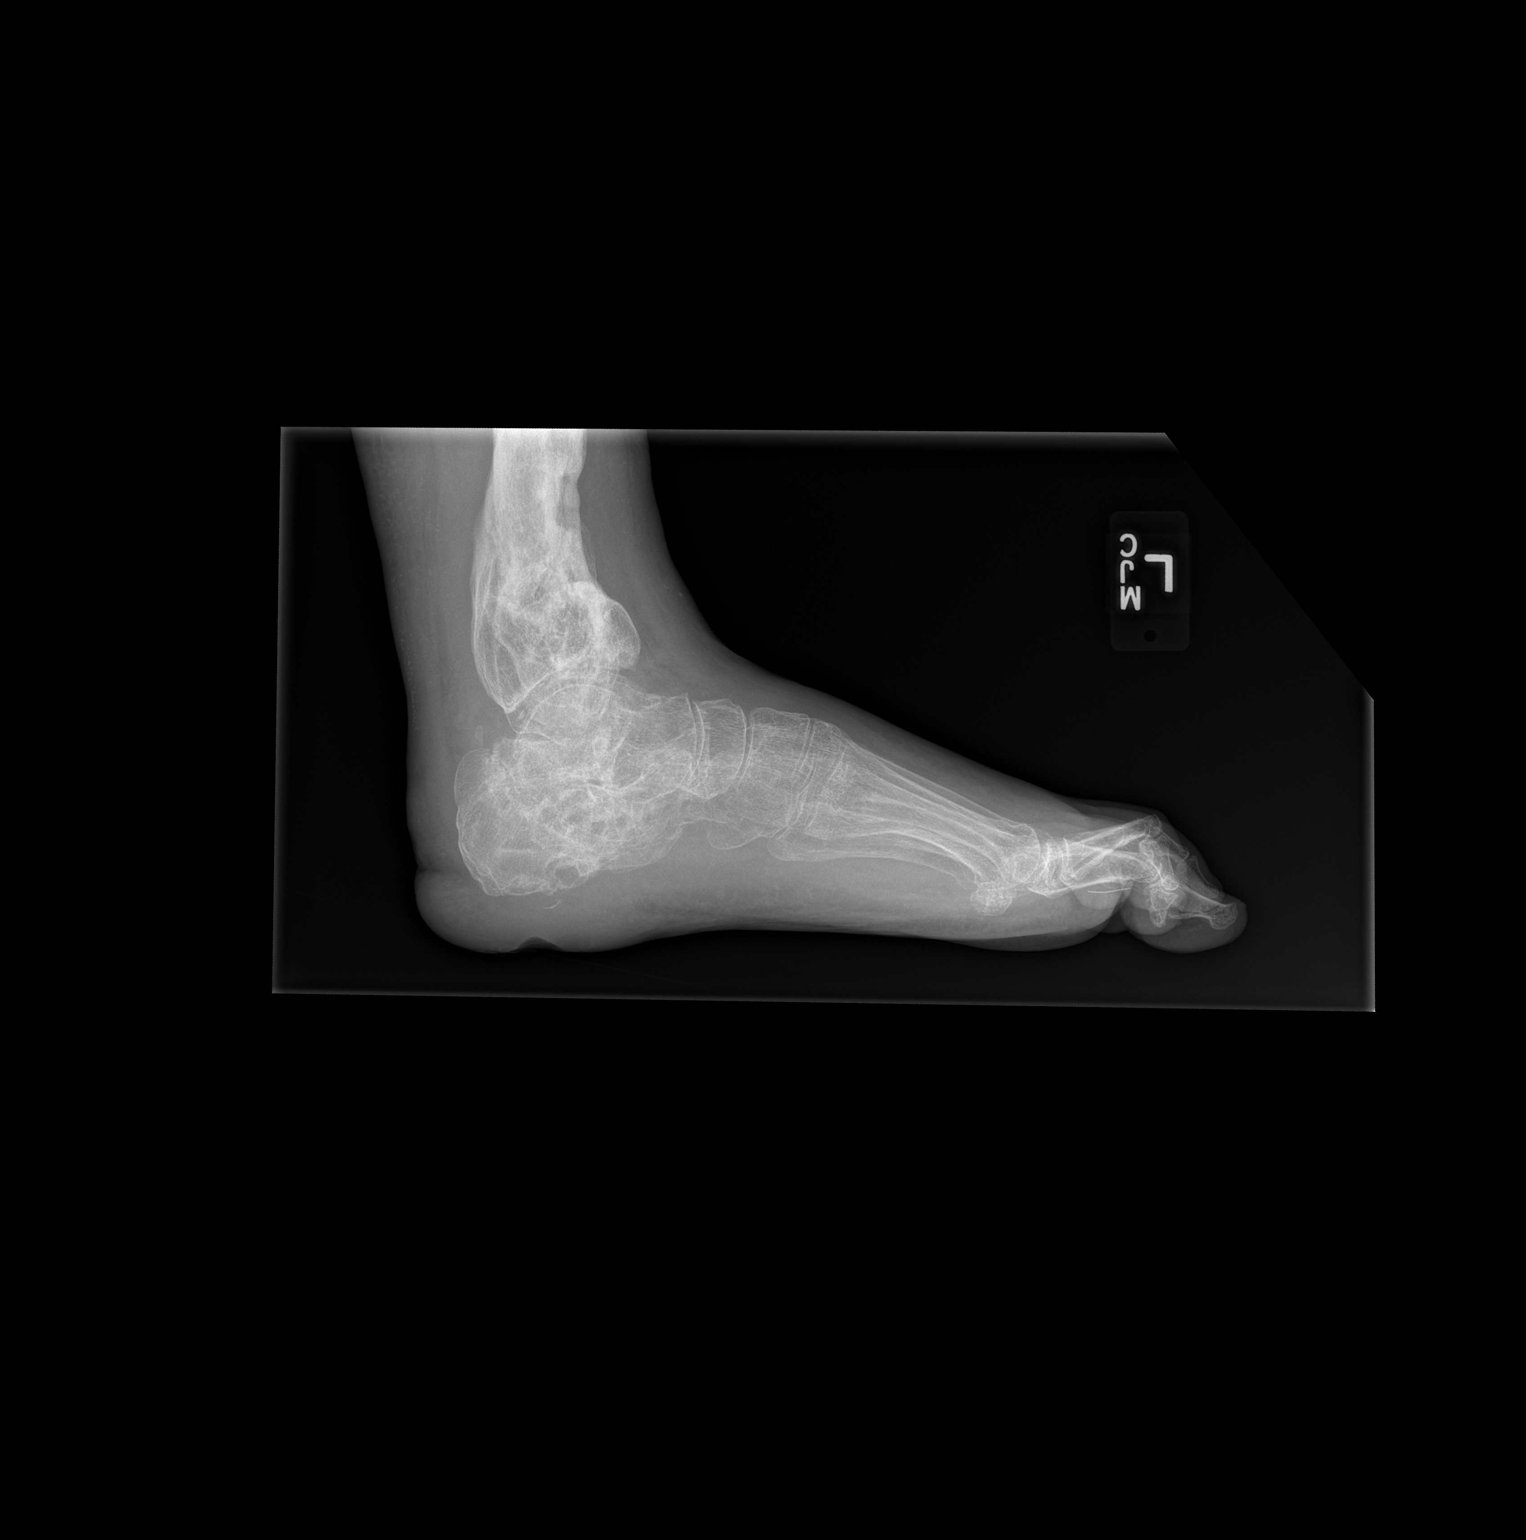

[3 of 3 positions shown; findings below may reference images not displayed]

FINDINGS: Diffuse bone demineralization. Chronic deformity and sclerosis of
the visualized distal tibia fibula, talus, and calcaneus. Possible
partial resection of the distal fibula. Partial fusion of the talus,
calcaneus, and cuboidal bones. This may be postoperative or
posttraumatic. Appearance is similar to prior study. No definite
erosions to suggest osteomyelitis although chronic osteomyelitis
could be present within the described bone changes. Deep soft tissue
ulceration over the plantar surface of the heel. No soft tissue gas
collections. Linear metallic foreign body demonstrated in the soft
tissues medial to the proximal phalanx of the left first toe.
Degenerative changes in the metatarsal phalangeal and
interphalangeal joints. No definite change since previous study.
IMPRESSION: No acute changes identified. Multiple chronic findings as described
are similar to previous study. Deep soft tissue ulceration is
demonstrated over the heel. No definite changes for acute
osteomyelitis.

## 2016-04-12 ENCOUNTER — Other Ambulatory Visit: Payer: Medicaid Other

## 2016-04-14 ENCOUNTER — Other Ambulatory Visit: Payer: Medicaid Other

## 2016-04-20 ENCOUNTER — Ambulatory Visit
Admission: RE | Admit: 2016-04-20 | Discharge: 2016-04-20 | Disposition: A | Discharge status: Home / Self Care | Payer: Medicaid Other | Source: Ambulatory Visit | Attending: Adult Health | Admitting: Adult Health

## 2016-04-20 DIAGNOSIS — M545 Low back pain: Secondary | ICD-10-CM

## 2016-04-21 ENCOUNTER — Other Ambulatory Visit: Payer: Medicaid Other

## 2016-05-02 ENCOUNTER — Other Ambulatory Visit: Payer: Self-pay | Admitting: Adult Health

## 2016-05-02 DIAGNOSIS — M545 Low back pain: Secondary | ICD-10-CM

## 2016-05-02 DIAGNOSIS — G894 Chronic pain syndrome: Secondary | ICD-10-CM

## 2016-06-01 ENCOUNTER — Ambulatory Visit
Admission: RE | Admit: 2016-06-01 | Discharge: 2016-06-01 | Disposition: A | Discharge status: Home / Self Care | Payer: Medicaid Other | Source: Ambulatory Visit | Attending: Adult Health | Admitting: Adult Health

## 2016-06-01 DIAGNOSIS — G894 Chronic pain syndrome: Secondary | ICD-10-CM

## 2016-06-01 DIAGNOSIS — M545 Low back pain: Secondary | ICD-10-CM

## 2016-06-01 MED ORDER — GADOBENATE DIMEGLUMINE 529 MG/ML IV SOLN
14.0000 mL | Freq: Once | INTRAVENOUS | Status: AC | PRN
  Administered 2016-06-01: 14 mL via INTRAVENOUS

## 2016-12-22 ENCOUNTER — Encounter (HOSPITAL_COMMUNITY): Payer: Self-pay | Admitting: Emergency Medicine

## 2016-12-22 ENCOUNTER — Emergency Department (HOSPITAL_COMMUNITY)
Admission: EM | Admit: 2016-12-22 | Discharge: 2016-12-24 | Disposition: A | Discharge status: Home / Self Care | Payer: Medicaid Other | Attending: Emergency Medicine | Admitting: Emergency Medicine

## 2016-12-22 DIAGNOSIS — I10 Essential (primary) hypertension: Secondary | ICD-10-CM | POA: Insufficient documentation

## 2016-12-22 DIAGNOSIS — N390 Urinary tract infection, site not specified: Secondary | ICD-10-CM | POA: Insufficient documentation

## 2016-12-22 DIAGNOSIS — F17210 Nicotine dependence, cigarettes, uncomplicated: Secondary | ICD-10-CM | POA: Diagnosis not present

## 2016-12-22 DIAGNOSIS — R350 Frequency of micturition: Secondary | ICD-10-CM | POA: Diagnosis present

## 2016-12-22 DIAGNOSIS — R319 Hematuria, unspecified: Secondary | ICD-10-CM | POA: Diagnosis not present

## 2016-12-22 DIAGNOSIS — Z79899 Other long term (current) drug therapy: Secondary | ICD-10-CM | POA: Diagnosis not present

## 2016-12-22 LAB — URINALYSIS, ROUTINE W REFLEX MICROSCOPIC
BILIRUBIN URINE: NEGATIVE
Glucose, UA: NEGATIVE mg/dL
KETONES UR: 5 mg/dL — AB
LEUKOCYTES UA: NEGATIVE
Nitrite: NEGATIVE
Protein, ur: 100 mg/dL — AB
SPECIFIC GRAVITY, URINE: 1.029 (ref 1.005–1.030)
Squamous Epithelial / LPF: NONE SEEN
pH: 5 (ref 5.0–8.0)

## 2016-12-22 LAB — RAPID URINE DRUG SCREEN, HOSP PERFORMED
Amphetamines: NOT DETECTED
Barbiturates: NOT DETECTED
Benzodiazepines: POSITIVE — AB
Cocaine: NOT DETECTED
OPIATES: POSITIVE — AB
TETRAHYDROCANNABINOL: POSITIVE — AB

## 2016-12-22 MED ORDER — PHENAZOPYRIDINE HCL 100 MG PO TABS: 100.0000 mg | ORAL_TABLET | Freq: Once | ORAL | Status: DC

## 2016-12-22 MED ORDER — CEPHALEXIN 500 MG PO CAPS: 500.0000 mg | ORAL_CAPSULE | Freq: Once | ORAL | Status: DC

## 2016-12-22 MED ORDER — CEPHALEXIN 500 MG PO CAPS: 500.0000 mg | ORAL_CAPSULE | Freq: Three times a day (TID) | ORAL | 0 refills | Status: DC

## 2016-12-22 MED ORDER — OXYCODONE-ACETAMINOPHEN 5-325 MG PO TABS: 1.0000 | ORAL_TABLET | Freq: Once | ORAL | Status: DC

## 2016-12-22 NOTE — ED Triage Notes (Signed)
Per EMs [place] movements.  Patient has left [facility].   Patient was found by syringe and has oxycodone on him. patient has wheelchair.
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
## 2016-12-22 NOTE — Discharge Instructions (Signed)
Take antibiotic as instructed. Follow-up with  primary care physician within 2 weeks to ensure that the blood in your urine has resolved. If your blood is not resolving you may need additional testing to ensure that he did not have additional urinary tract abnormalities.

## 2016-12-23 NOTE — ED Notes (Signed)
Pt discharge by another nurse and meds not given

## 2017-01-04 ENCOUNTER — Other Ambulatory Visit (HOSPITAL_COMMUNITY): Payer: Self-pay | Admitting: Internal Medicine

## 2017-01-08 ENCOUNTER — Other Ambulatory Visit (HOSPITAL_COMMUNITY): Payer: Self-pay | Admitting: Nurse Practitioner

## 2017-01-08 DIAGNOSIS — T8489XA Other specified complication of internal orthopedic prosthetic devices, implants and grafts, initial encounter: Secondary | ICD-10-CM

## 2017-01-08 DIAGNOSIS — M5416 Radiculopathy, lumbar region: Secondary | ICD-10-CM

## 2017-01-16 ENCOUNTER — Inpatient Hospital Stay (HOSPITAL_COMMUNITY): Payer: Medicaid Other

## 2017-01-16 ENCOUNTER — Emergency Department (HOSPITAL_COMMUNITY): Payer: Medicaid Other

## 2017-01-16 ENCOUNTER — Encounter (HOSPITAL_COMMUNITY): Payer: Self-pay

## 2017-01-16 ENCOUNTER — Inpatient Hospital Stay (HOSPITAL_COMMUNITY)
Admission: EM | Admit: 2017-01-16 | Discharge: 2017-01-18 | DRG: 871 | Disposition: A | Discharge status: Home / Self Care | Payer: Medicaid Other | Attending: Internal Medicine | Admitting: Internal Medicine

## 2017-01-16 DIAGNOSIS — F419 Anxiety disorder, unspecified: Secondary | ICD-10-CM | POA: Diagnosis present

## 2017-01-16 DIAGNOSIS — R52 Pain, unspecified: Secondary | ICD-10-CM

## 2017-01-16 DIAGNOSIS — F1190 Opioid use, unspecified, uncomplicated: Secondary | ICD-10-CM | POA: Diagnosis present

## 2017-01-16 DIAGNOSIS — Z79899 Other long term (current) drug therapy: Secondary | ICD-10-CM | POA: Diagnosis not present

## 2017-01-16 DIAGNOSIS — G8929 Other chronic pain: Secondary | ICD-10-CM | POA: Diagnosis present

## 2017-01-16 DIAGNOSIS — L0291 Cutaneous abscess, unspecified: Secondary | ICD-10-CM | POA: Diagnosis present

## 2017-01-16 DIAGNOSIS — G061 Intraspinal abscess and granuloma: Secondary | ICD-10-CM

## 2017-01-16 DIAGNOSIS — L02212 Cutaneous abscess of back [any part, except buttock]: Secondary | ICD-10-CM | POA: Diagnosis present

## 2017-01-16 DIAGNOSIS — J189 Pneumonia, unspecified organism: Secondary | ICD-10-CM | POA: Diagnosis present

## 2017-01-16 DIAGNOSIS — B9689 Other specified bacterial agents as the cause of diseases classified elsewhere: Secondary | ICD-10-CM

## 2017-01-16 DIAGNOSIS — IMO0002 Reserved for concepts with insufficient information to code with codable children: Secondary | ICD-10-CM

## 2017-01-16 DIAGNOSIS — F17210 Nicotine dependence, cigarettes, uncomplicated: Secondary | ICD-10-CM | POA: Diagnosis present

## 2017-01-16 DIAGNOSIS — F1110 Opioid abuse, uncomplicated: Secondary | ICD-10-CM | POA: Diagnosis present

## 2017-01-16 DIAGNOSIS — Z981 Arthrodesis status: Secondary | ICD-10-CM

## 2017-01-16 DIAGNOSIS — G8921 Chronic pain due to trauma: Secondary | ICD-10-CM

## 2017-01-16 DIAGNOSIS — F1210 Cannabis abuse, uncomplicated: Secondary | ICD-10-CM | POA: Diagnosis present

## 2017-01-16 DIAGNOSIS — I10 Essential (primary) hypertension: Secondary | ICD-10-CM

## 2017-01-16 DIAGNOSIS — F1120 Opioid dependence, uncomplicated: Secondary | ICD-10-CM | POA: Diagnosis not present

## 2017-01-16 DIAGNOSIS — N210 Calculus in bladder: Secondary | ICD-10-CM | POA: Diagnosis present

## 2017-01-16 DIAGNOSIS — M545 Low back pain: Secondary | ICD-10-CM | POA: Diagnosis present

## 2017-01-16 DIAGNOSIS — D649 Anemia, unspecified: Secondary | ICD-10-CM | POA: Diagnosis present

## 2017-01-16 DIAGNOSIS — B1920 Unspecified viral hepatitis C without hepatic coma: Secondary | ICD-10-CM | POA: Diagnosis not present

## 2017-01-16 DIAGNOSIS — Z8049 Family history of malignant neoplasm of other genital organs: Secondary | ICD-10-CM

## 2017-01-16 DIAGNOSIS — G629 Polyneuropathy, unspecified: Secondary | ICD-10-CM | POA: Diagnosis present

## 2017-01-16 DIAGNOSIS — T84296A Other mechanical complication of internal fixation device of vertebrae, initial encounter: Secondary | ICD-10-CM | POA: Diagnosis present

## 2017-01-16 DIAGNOSIS — Z886 Allergy status to analgesic agent status: Secondary | ICD-10-CM

## 2017-01-16 DIAGNOSIS — A419 Sepsis, unspecified organism: Secondary | ICD-10-CM | POA: Diagnosis present

## 2017-01-16 DIAGNOSIS — Z89512 Acquired absence of left leg below knee: Secondary | ICD-10-CM | POA: Diagnosis not present

## 2017-01-16 DIAGNOSIS — M4620 Osteomyelitis of vertebra, site unspecified: Secondary | ICD-10-CM | POA: Diagnosis present

## 2017-01-16 DIAGNOSIS — F1990 Other psychoactive substance use, unspecified, uncomplicated: Secondary | ICD-10-CM

## 2017-01-16 DIAGNOSIS — I361 Nonrheumatic tricuspid (valve) insufficiency: Secondary | ICD-10-CM | POA: Diagnosis not present

## 2017-01-16 DIAGNOSIS — Y838 Other surgical procedures as the cause of abnormal reaction of the patient, or of later complication, without mention of misadventure at the time of the procedure: Secondary | ICD-10-CM | POA: Diagnosis present

## 2017-01-16 DIAGNOSIS — Z8051 Family history of malignant neoplasm of kidney: Secondary | ICD-10-CM

## 2017-01-16 HISTORY — DX: Disorder of kidney and ureter, unspecified: N28.9

## 2017-01-16 LAB — URINALYSIS, ROUTINE W REFLEX MICROSCOPIC
Bacteria, UA: NONE SEEN
Bilirubin Urine: NEGATIVE
GLUCOSE, UA: NEGATIVE mg/dL
Ketones, ur: NEGATIVE mg/dL
Leukocytes, UA: NEGATIVE
NITRITE: NEGATIVE
PH: 6 (ref 5.0–8.0)
Protein, ur: 30 mg/dL — AB
SPECIFIC GRAVITY, URINE: 1.024 (ref 1.005–1.030)
Squamous Epithelial / LPF: NONE SEEN

## 2017-01-16 LAB — RAPID HIV SCREEN (HIV 1/2 AB+AG)
HIV 1/2 ANTIBODIES: NONREACTIVE
HIV-1 P24 ANTIGEN - HIV24: NONREACTIVE

## 2017-01-16 LAB — CBC WITH DIFFERENTIAL/PLATELET
Basophils Absolute: 0 10*3/uL (ref 0.0–0.1)
Basophils Relative: 0 %
EOS PCT: 1 %
Eosinophils Absolute: 0.1 10*3/uL (ref 0.0–0.7)
HCT: 29.9 % — ABNORMAL LOW (ref 39.0–52.0)
HEMOGLOBIN: 10.2 g/dL — AB (ref 13.0–17.0)
LYMPHS ABS: 0.8 10*3/uL (ref 0.7–4.0)
LYMPHS PCT: 15 %
MCH: 27.5 pg (ref 26.0–34.0)
MCHC: 34.1 g/dL (ref 30.0–36.0)
MCV: 80.6 fL (ref 78.0–100.0)
MONOS PCT: 14 %
Monocytes Absolute: 0.7 10*3/uL (ref 0.1–1.0)
NEUTROS PCT: 70 %
Neutro Abs: 3.7 10*3/uL (ref 1.7–7.7)
Platelets: 164 10*3/uL (ref 150–400)
RBC: 3.71 MIL/uL — ABNORMAL LOW (ref 4.22–5.81)
RDW: 17.8 % — ABNORMAL HIGH (ref 11.5–15.5)
WBC: 5.3 10*3/uL (ref 4.0–10.5)

## 2017-01-16 LAB — BASIC METABOLIC PANEL
Anion gap: 9 (ref 5–15)
BUN: 12 mg/dL (ref 6–20)
CALCIUM: 8.7 mg/dL — AB (ref 8.9–10.3)
CHLORIDE: 99 mmol/L — AB (ref 101–111)
CO2: 24 mmol/L (ref 22–32)
CREATININE: 0.69 mg/dL (ref 0.61–1.24)
GFR calc Af Amer: >60 mL/min (ref >60)
GFR calc non Af Amer: >60 mL/min (ref >60)
GLUCOSE: 124 mg/dL — AB (ref 65–99)
Potassium: 3.7 mmol/L (ref 3.5–5.1)
Sodium: 132 mmol/L — ABNORMAL LOW (ref 135–145)

## 2017-01-16 LAB — HEPATIC FUNCTION PANEL
ALK PHOS: 84 U/L (ref 38–126)
ALT: 27 U/L (ref 17–63)
AST: 29 U/L (ref 15–41)
Albumin: 3.1 g/dL — ABNORMAL LOW (ref 3.5–5.0)
BILIRUBIN DIRECT: 0.4 mg/dL (ref 0.1–0.5)
BILIRUBIN INDIRECT: 0.9 mg/dL (ref 0.3–0.9)
Total Bilirubin: 1.3 mg/dL — ABNORMAL HIGH (ref 0.3–1.2)
Total Protein: 6.8 g/dL (ref 6.5–8.1)

## 2017-01-16 LAB — CBC
HEMATOCRIT: 31.3 % — AB (ref 39.0–52.0)
Hemoglobin: 10.4 g/dL — ABNORMAL LOW (ref 13.0–17.0)
MCH: 26.7 pg (ref 26.0–34.0)
MCHC: 33.2 g/dL (ref 30.0–36.0)
MCV: 80.5 fL (ref 78.0–100.0)
Platelets: 179 10*3/uL (ref 150–400)
RBC: 3.89 MIL/uL — ABNORMAL LOW (ref 4.22–5.81)
RDW: 17.4 % — AB (ref 11.5–15.5)
WBC: 5.6 10*3/uL (ref 4.0–10.5)

## 2017-01-16 LAB — SEDIMENTATION RATE: SED RATE: 49 mm/h — AB (ref 0–16)

## 2017-01-16 LAB — I-STAT CG4 LACTIC ACID, ED: Lactic Acid, Venous: 1.01 mmol/L (ref 0.5–1.9)

## 2017-01-16 MED ORDER — SODIUM CHLORIDE 0.9 % IV BOLUS (SEPSIS)
500.0000 mL | Freq: Once | INTRAVENOUS | Status: AC
  Administered 2017-01-16: 500 mL via INTRAVENOUS

## 2017-01-16 MED ORDER — VANCOMYCIN HCL IN DEXTROSE 750-5 MG/150ML-% IV SOLN
750.0000 mg | Freq: Two times a day (BID) | INTRAVENOUS | Status: DC
  Administered 2017-01-16 – 2017-01-18 (×4): 750 mg via INTRAVENOUS
  Filled 2017-01-16 (×7): qty 150

## 2017-01-16 MED ORDER — VANCOMYCIN HCL IN DEXTROSE 1-5 GM/200ML-% IV SOLN
1000.0000 mg | Freq: Once | INTRAVENOUS | Status: AC
  Administered 2017-01-16: 1000 mg via INTRAVENOUS
  Filled 2017-01-16: qty 200

## 2017-01-16 MED ORDER — ACETAMINOPHEN 650 MG RE SUPP: 650.0000 mg | Freq: Four times a day (QID) | RECTAL | Status: DC | PRN

## 2017-01-16 MED ORDER — ALBUTEROL SULFATE (2.5 MG/3ML) 0.083% IN NEBU
5.0000 mg | INHALATION_SOLUTION | Freq: Once | RESPIRATORY_TRACT | Status: AC
  Administered 2017-01-16: 5 mg via RESPIRATORY_TRACT
  Filled 2017-01-16: qty 6

## 2017-01-16 MED ORDER — PIPERACILLIN-TAZOBACTAM 3.375 G IVPB 30 MIN
3.3750 g | Freq: Once | INTRAVENOUS | Status: AC
  Administered 2017-01-16: 3.375 g via INTRAVENOUS
  Filled 2017-01-16: qty 50

## 2017-01-16 MED ORDER — VANCOMYCIN HCL IN DEXTROSE 1-5 GM/200ML-% IV SOLN: 1000.0000 mg | Freq: Two times a day (BID) | INTRAVENOUS | Status: DC

## 2017-01-16 MED ORDER — ENOXAPARIN SODIUM 40 MG/0.4ML ~~LOC~~ SOLN
40.0000 mg | SUBCUTANEOUS | Status: DC
  Administered 2017-01-16 – 2017-01-17 (×2): 40 mg via SUBCUTANEOUS
  Filled 2017-01-16 (×2): qty 0.4

## 2017-01-16 MED ORDER — PIPERACILLIN-TAZOBACTAM 3.375 G IVPB
3.3750 g | Freq: Three times a day (TID) | INTRAVENOUS | Status: DC
  Administered 2017-01-16 – 2017-01-18 (×6): 3.375 g via INTRAVENOUS
  Filled 2017-01-16 (×10): qty 50

## 2017-01-16 MED ORDER — IPRATROPIUM BROMIDE 0.02 % IN SOLN
0.5000 mg | Freq: Once | RESPIRATORY_TRACT | Status: AC
  Administered 2017-01-16: 0.5 mg via RESPIRATORY_TRACT
  Filled 2017-01-16: qty 2.5

## 2017-01-16 MED ORDER — ACETAMINOPHEN 500 MG PO TABS
1000.0000 mg | ORAL_TABLET | Freq: Once | ORAL | Status: AC
  Administered 2017-01-16: 1000 mg via ORAL
  Filled 2017-01-16: qty 2

## 2017-01-16 MED ORDER — OXYCODONE-ACETAMINOPHEN 5-325 MG PO TABS
1.0000 | ORAL_TABLET | ORAL | Status: DC | PRN
  Administered 2017-01-16 – 2017-01-18 (×11): 2 via ORAL
  Filled 2017-01-16 (×11): qty 2

## 2017-01-16 MED ORDER — SODIUM CHLORIDE 0.9 % IV BOLUS (SEPSIS)
1000.0000 mL | Freq: Once | INTRAVENOUS | Status: AC
  Administered 2017-01-16: 1000 mL via INTRAVENOUS

## 2017-01-16 MED ORDER — ACETAMINOPHEN 325 MG PO TABS: 650.0000 mg | ORAL_TABLET | Freq: Four times a day (QID) | ORAL | Status: DC | PRN

## 2017-01-16 MED ORDER — SODIUM CHLORIDE 0.9% FLUSH
3.0000 mL | Freq: Two times a day (BID) | INTRAVENOUS | Status: DC
Start: 2017-01-16 — End: 2017-01-18
  Administered 2017-01-16: 3 mL via INTRAVENOUS

## 2017-01-16 MED ORDER — VANCOMYCIN HCL IN DEXTROSE 1-5 GM/200ML-% IV SOLN: 1000.0000 mg | Freq: Once | INTRAVENOUS | Status: DC

## 2017-01-16 MED ORDER — IOPAMIDOL (ISOVUE-300) INJECTION 61%
INTRAVENOUS | Status: AC
  Administered 2017-01-16: 100 mL
  Filled 2017-01-16: qty 100

## 2017-01-16 NOTE — Progress Notes (Signed)
[facility]

## 2017-01-16 NOTE — Consult Note (Signed)
WOC Nurse wound consult note Reason for Consult: lumbar abscess  Reviewed chart, patient with history of this for last 6-7 years. Surgical procedure at [facility] 7 years ago. Chart noted that patient had area opened in the jail while incarcerated, however patient states that the area "opened on its own and drained"  Wound type: abscess Pressure Injury POA: No Measurement:aprox. 5cm x 5cm  Wound bed: not open that I can tell, I did not try to express drainage today, due to the nature of the abscess and proximity to spinal cord. Drainage (amount, consistency, odor) some serous drainage noted on his sheets Periwound: erythema and superficial skin peeling, skin discoloration most likely from chronic nature Dressing procedure/placement/frequency: No topical recommended at this time.  Needs incision and drainage with packing to keep open, outside of the scope of the WOC nurse.  Would need surgery to perform.  Discussed POC with patient and bedside nurse.  Re consult if needed, will not follow at this time. Thanks  Elbert Polyakov M.[employer] MSN, RN,CWOCN, CNS [phone])

## 2017-01-16 NOTE — ED Notes (Signed)
Patient transported to Vascular 

## 2017-01-16 NOTE — ED Notes (Signed)
Patient transported to CT 

## 2017-01-16 NOTE — Progress Notes (Signed)
Pharmacy Antibiotic Note  Ethan [name] is a 56 y.o. male admitted on 01/16/2017 with fevers, pain/swelling and worsening lower back abcess.  Pharmacy has been consulted for Vancomycin and Zosyn  dosing.  Plan: Vancomycin 1 g IV q12h Zosyn 3.375 g IV q8h   Height: 5\' 10"  (177.8 cm) Weight: 110 lb (49.9 kg) IBW/kg (Calculated) : 73  Temp (24hrs), Avg:101.3 F (38.5 C), Min:100.6 F (38.1 C), Max:101.9 F (38.8 C)   Recent Labs Lab 01/16/17 0223 01/16/17 0417 01/16/17 0442  WBC 5.6 5.3  --   CREATININE 0.69  --   --   LATICACIDVEN  --   --  1.01    Estimated Creatinine Clearance: 72.8 mL/min (by C-G formula based on SCr of 0.69 mg/dL).    Allergies  Allergen Reactions  . Motrin [Ibuprofen] Nausea Only     Eddie Candlebbott, Arelys Glassco [place] 01/16/2017 6:49 AM
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
## 2017-01-16 NOTE — Progress Notes (Signed)
Pt arrived to the unit at 1745 via bed.

## 2017-01-16 NOTE — Progress Notes (Signed)
Wyline MoodJackie R Martel is a 56 y.o. male patient admitted from ED awake, alert - oriented  X 4 - no acute distress noted.  VSS - Blood pressure 137/69, pulse 78, temperature 98.6 F (37 C), resp. rate 18, height 6' (1.829 m), weight 61.1 kg (134 lb 11.2 oz), SpO2 99 %.    IV in place, occlusive dsg intact without redness.  Orientation to room, and floor completed with information packet given to patient/family.  Patient declined safety video at this time.  Admission INP armband ID verified with patient, and in place.   SR up x 2, fall assessment complete, with patient able to verbalize understanding of risk associated with falls, and verbalized understanding to call nsg before up out of bed.  Call light within reach, patient able to voice, and demonstrate understanding.  Scatter rash noted on both hands\. Raised abscess, the size of gulf ball is also noted on left side lower back. A necrotic tissue also noted on second toe, stage 1 pressure ulcer on great toe of the lower extremity noted. Pt requested for pain med, which was administered. He is resting at this time,will cont to eval and treat per MD orders.  Melvenia NeedlesIreti O Sahil Milner, RN 01/16/2017 8:32 PM
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
## 2017-01-16 NOTE — Progress Notes (Signed)
*  PRELIMINARY RESULTS* Vascular Ultrasound Right lower extresmity venous duplex has been completed.  Preliminary findings: No evidence of deep vein thrombosis involving the visualized veins of the right lower extremity. Small nonobstructing echogenic focus seen mid FV near a branch.  Chauncey FischerCharlotte C Bryne Lindon 01/16/2017, 4:34 PM

## 2017-01-16 NOTE — ED Triage Notes (Signed)
Per EMS: Pt with open abscess to mid lower back. Pt states opened while in jail. Pt with drainage from abscess. L BKA. Pt states recent levaquin abx for UTI and abscess. Has not been able to fill rx since release. Pt also complaining of dysuria and odor. Pt with LARGE abscess to L lower back.
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
## 2017-01-16 NOTE — ED Notes (Addendum)
Pt unable to void at this time. Pt given specimen cup 

## 2017-01-16 NOTE — Progress Notes (Signed)
Pharmacy Antibiotic Note  Ethan [name] is a 56 y.o. male admitted on 01/16/2017 with fevers, pain/swelling and worsening lower back abcess.  Pharmacy has been consulted for Vancomycin and Zosyn  Dosing. Tmax 101.9, wbc wnl, CrCl~73.  S/p vancomycin 1g IV x 1 in the ED at 0812.  Plan: Vancomycin 750mg  IV q12h Zosyn 3.375 g IV q8h  Monitor clinical progress, c/s, renal function, abx plan/LOT Vancomycin trough as indicated   Height: 5\' 10"  (177.8 cm) Weight: 110 lb (49.9 kg) IBW/kg (Calculated) : 73  Temp (24hrs), Avg:101.3 F (38.5 C), Min:100.6 F (38.1 C), Max:101.9 F (38.8 C)   Recent Labs Lab 01/16/17 0223 01/16/17 0417 01/16/17 0442  WBC 5.6 5.3  --   CREATININE 0.69  --   --   LATICACIDVEN  --   --  1.01    Estimated Creatinine Clearance: 72.8 mL/min (by C-G formula based on SCr of 0.69 mg/dL).    Allergies  Allergen Reactions  . Motrin [Ibuprofen] Nausea Only     Babs BertinHaley Jamey Demchak, PharmD, BCPS Clinical Pharmacist 01/16/2017 8:26 AM

## 2017-01-17 ENCOUNTER — Ambulatory Visit (HOSPITAL_COMMUNITY): Payer: Medicaid Other

## 2017-01-17 ENCOUNTER — Inpatient Hospital Stay (HOSPITAL_COMMUNITY): Payer: Medicaid Other

## 2017-01-17 DIAGNOSIS — I361 Nonrheumatic tricuspid (valve) insufficiency: Secondary | ICD-10-CM

## 2017-01-17 DIAGNOSIS — B1920 Unspecified viral hepatitis C without hepatic coma: Secondary | ICD-10-CM | POA: Diagnosis present

## 2017-01-17 DIAGNOSIS — A419 Sepsis, unspecified organism: Principal | ICD-10-CM

## 2017-01-17 LAB — HEPATITIS C ANTIBODY (REFLEX): HCV Ab: >11 s/co ratio — ABNORMAL HIGH (ref 0.0–0.9)

## 2017-01-17 LAB — ECHOCARDIOGRAM COMPLETE
CHL CUP RV SYS PRESS: 38 mmHg
CHL CUP TV REG PEAK VELOCITY: 273 cm/s
EERAT: 6.59
EWDT: 232 ms
FS: 44 % (ref 28–44)
Height: 72 in
IVS/LV PW RATIO, ED: 0.95
LA vol A4C: 58.5 ml
LA vol: 66 mL
LADIAMINDEX: 2.39 cm/m2
LASIZE: 43 mm
LAVOLIN: 36.7 mL/m2
LDCA: 3.8 cm2
LEFT ATRIUM END SYS DIAM: 43 mm
LV TDI E'LATERAL: 17.3
LV TDI E'MEDIAL: 10.4
LV e' LATERAL: 17.3 cm/s
LVEEAVG: 6.59
LVEEMED: 6.59
LVOT VTI: 25.3 cm
LVOT diameter: 22 mm
LVOT peak grad rest: 5 mmHg
LVOTPV: 113 cm/s
LVOTSV: 96 mL
Lateral S' vel: 17 cm/s
MV Dec: 232
MV pk A vel: 96.1 m/s
MVPG: 5 mmHg
MVPKEVEL: 114 m/s
PW: 9.54 mm — AB (ref 0.6–1.1)
RV TAPSE: 28.6 mm
TR max vel: 273 cm/s
Weight: 2155.22 oz

## 2017-01-17 LAB — URINE CULTURE

## 2017-01-17 LAB — COMMENT2 - HEP PANEL

## 2017-01-17 MED ORDER — LISINOPRIL 40 MG PO TABS
40.0000 mg | ORAL_TABLET | Freq: Every day | ORAL | Status: DC
  Administered 2017-01-17 – 2017-01-18 (×2): 40 mg via ORAL
  Filled 2017-01-17 (×2): qty 1

## 2017-01-17 MED ORDER — DEXTROMETHORPHAN POLISTIREX ER 30 MG/5ML PO SUER
15.0000 mg | Freq: Two times a day (BID) | ORAL | Status: DC | PRN
  Administered 2017-01-17 – 2017-01-18 (×2): 15 mg via ORAL
  Filled 2017-01-17 (×4): qty 5

## 2017-01-17 NOTE — Progress Notes (Signed)
CM received consult : Pt.is homeless. CM made referral to CSW. Gae GallopAngela Youssef Footman RN,BSN,CM
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
## 2017-01-17 NOTE — Progress Notes (Signed)
  Echocardiogram 2D Echocardiogram has been performed.  Delcie RochENNINGTON, Chace Klippel 01/17/2017, 5:03 PM

## 2017-01-18 ENCOUNTER — Encounter: Payer: Self-pay | Admitting: Internal Medicine

## 2017-01-18 DIAGNOSIS — J189 Pneumonia, unspecified organism: Secondary | ICD-10-CM

## 2017-01-18 LAB — BASIC METABOLIC PANEL
Anion gap: 5 (ref 5–15)
BUN: 9 mg/dL (ref 6–20)
CALCIUM: 8.3 mg/dL — AB (ref 8.9–10.3)
CO2: 24 mmol/L (ref 22–32)
CREATININE: 0.68 mg/dL (ref 0.61–1.24)
Chloride: 106 mmol/L (ref 101–111)
Glucose, Bld: 96 mg/dL (ref 65–99)
Potassium: 3.5 mmol/L (ref 3.5–5.1)
SODIUM: 135 mmol/L (ref 135–145)

## 2017-01-18 LAB — HCV RNA QUANT: HCV QUANT: NOT DETECTED [IU]/mL (ref >50)

## 2017-01-18 MED ORDER — AMOXICILLIN-POT CLAVULANATE 875-125 MG PO TABS: 1.0000 | ORAL_TABLET | Freq: Two times a day (BID) | ORAL | 0 refills | Status: AC

## 2017-01-18 NOTE — Progress Notes (Addendum)
Transitions of Care Pharmacy Note  Plan:  Educated on new augmentin prescription  --------------------------------------------- Ethan [name] is an 56 y.o. male who presents with a chief complaint of sepsis. In anticipation of discharge, pharmacy has reviewed this patient's prior to admission medication history, as well as [facility]  Current medication indications, dosing, frequency, and notable side effects reviewed with patient. patient verbalized understanding of current inpatient medication regimen and is aware that the After Visit Summary when presented, will represent the most accurate medication list at discharge.   Assessment: Understanding of regimen: good Understanding of indications: fair Potential of compliance: fair Barriers to Obtaining Medications: No  Patient instructed to contact inpatient pharmacy team with further questions or concerns if needed.    Time spent preparing for discharge counseling: 10 minutes Time spent counseling patient: 5 minutes   Thank you for allowing pharmacy to be a part of this patient's care.  Carylon PerchesMaggie Shuda, PharmD Acute Care Pharmacy Resident  Pager: [phone] 01/18/2017

## 2017-01-18 NOTE — Care Management Note (Addendum)
Case Management Note  Patient Details  Name: Ethan [name] MRN: [id number] Date of Birth: Jan 17, 1961  Subjective/Objective:      Admitted with sepsis. Hx of L bka / wheel bound. States independent with ADL's ,DME:  wheelchair  States lives in a tent.[address] [place] per pt.            PCP: Ceasar LundJulie Barr James Dillard [place](Friend) Renee RivalJeff Ludke [facility]([phone])    [phone]      Action/Plan: Plan is to d/c today. Pt states friend will provide transportation to residence( tent).  Expected Discharge Date:  01/18/17               Expected Discharge Plan:  Home/Self Care  In-House Referral:  Clinical Social Work/ homeless - homeless shelter listings provided to pt and [place].  Discharge planning Services  CM Consult  Post Acute Care Choice:    Choice offered to:     DME Arranged:    DME Agency:     HH Arranged:    HH Agency:     Status of Service:  Completed, signed off  If discussed at [employer] Meetings, dates discussed:    Additional Comments:  Epifanio LeschesCole, Monta Maiorana Hudson, RN 01/18/2017, 12:23 PM

## 2017-01-18 NOTE — Discharge Summary (Signed)
Name: Ethan [name] MRN: [id number] DOB: Jun 08, 1961 56 y.o. PCP: [name], NP  Date of Admission: 01/16/2017  3:22 AM Date of Discharge: 01/18/2017 Attending Physician: Lucious Groves, DO  Discharge Diagnosis: Principal Problem:   Sepsis due to pneumonia [facility]) Active Problems:   Community acquired pneumonia   Anemia   Chronic lower back pain   Heroin use   Below knee amputation status ([place])   Paraspinal abscess ([place])   Hepatitis C infection   Discharge Medications: Allergies as of 01/18/2017      Reactions   Motrin [ibuprofen] Nausea Only      Medication List    STOP taking these medications   acetaminophen 500 MG tablet Commonly known as:  TYLENOL   cephALEXin 500 MG capsule Commonly known as:  KEFLEX   LORazepam 0.5 MG tablet Commonly known as:  ATIVAN   oxycodone 30 MG immediate release tablet Commonly known as:  ROXICODONE     TAKE these medications   amoxicillin-clavulanate 875-125 MG tablet Commonly known as:  AUGMENTIN Take 1 tablet by mouth 2 (two) times daily.   lisinopril 40 MG tablet Commonly known as:  PRINIVIL,ZESTRIL Take 40 mg by mouth daily.       Disposition and follow-up:   [name] was discharged from [facility] in Good condition.  At the hospital follow up visit please address:  1.  CAP: compliance w/ Abx therapy? Has the pt had any return of fever/chills/const symptoms since discharge to suggests other infectious source? Paraspinal abcess: Continue to monitor for change or worsening, pt is at high risk for recurrence of systemic infection. May benefit from outpt referral to surgery for continued monitoring and management. Urinary Calculus: Noted on CT w/ microscopic hematuria since Feb. Should likely have f/u UA and referral to urology for further evaluation. SCr was wnl w/o signs or sx of glomerular disease. HCV: Pos Ab w/ high titer. Quantitative PCR was negative indicating cleared past infection. Pt  is aware of findings. Polysubstance Abuse: Pt received resources for cessation and is aware that he will need to pursue these if he is interested and therapy for his Hep C.  2.  Labs / imaging needed at time of follow-up: UA, CBC  Follow-up Appointments: Follow-up Information    [name], NP. Go on 02/12/2017.   Specialty:  Nurse Practitioner Why:  Appt at 11:00am for hospital follow-up Contact information: [place] [facility] [address] [place] [address] [facility] by problem list: Principal Problem:   Sepsis due to pneumonia [facility]) Active Problems:   Community acquired pneumonia   Anemia   Chronic lower back pain   Heroin use   Below knee amputation status ([place])   Paraspinal abscess ([place])   Hepatitis C infection   1) Sepsis from CAP: Pt presented with fever and constitutional symptoms and soft BP concerning for sepsis. Initially with multiple potential sources. Pt w/ mild resp sx of cough and congestion and CXR concerning for R PNA, crackles initially on exam which resolved by discharge. Pt also had several areas of erythema around heroin injection sites but clinically there was low suscpicion for cellulitis. Pt also has a chronic extensive paraspinal abscess, w/ reports of recent drainage, had I&D several weeks ago while in prison and short course of Levoquin. Pt reports that this abcess is much improved from 10 days prior. Mildly elevated ESR, although CT  imaging appears stable. Neurosurg was consulted and recommended no intervention. They suspect a chronic process which is not responsible for the pt's acute illness. Pt is also at high risk for endocarditis, but had a negative TTE. BCx were drawn and neg x 2 days at the time of discharge. Pt was on broad spectrum Abx coverage with Vanc/Zosyn x 2 days and was transitioned to oral augmentin for CAP coverage at discharge x 3 additional days. Remains afebrile since admission. No  leukocytosis. Remained HDS.  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 He does have hardware in the lumbosacral spine. He had a MR lumbar spine in June 2017 which showed a large fluid collection around the left posterolateral rod extending to the left posterior subcutaneous tissues as well as a smaller fluid collection on the right side. CT abd/pelvis today does not show significant changes from this MR scan indicating this is likely a chronic issue. Neurosurgery was consulted by the ED and they feel this is a chronic issue and related to hardware malfunction. If any surgical intervention is required in the future, they recommend return to [facility] where his prior surgeries were preformed for multispecialty support.  3) Urinary Bladder Calculus: Noted on CT abd/pelvis. Calculus is 2.5 cm in size. He also has several left kidney lesions that have been noted on prior imaging that are thought to be benign. Patient with numerous RBCs on his UA at admission. Microscopic hematuria noted on prior UA in Feb this year but has been normal otherwise. Patient denies gross hematuria. He has a chronic anemia that has been stable since at least 2015. Would benefit from Urology referral as an outpatient.  4) Polysubstance abuse: Hx of IV heroin abuse as well as marijuana. Patient reports daily heroin use for several years. Has tried a rehab program before but was not successful. Multiple track marks noted on exam. UDS pos for opiates. We discussed the risks of continued IV heroin abuse, especially in setting of chronic narcotic therapy. HIV negative. HCV Ab pos, see below.  5) HCV: Pt with high titer of pos HCV Ab suggesting current or past infection. Will order quant HCV PCR and further testing if positive. No signs of cirrhosis or significant LFT abnormalities on CT A/P. A HCV quant PCR  test was negative, indicating past infection that was cleared.  Discharge Vitals:   BP 140/76   Pulse 65   Temp 98.2 F (36.8 C) (Oral)   Resp 18   Ht 6' (1.829 m)   Wt 134 lb 11.2 oz (61.1 kg)   SpO2 99%   BMI 18.27 kg/m   Pertinent Labs, Studies, and Procedures: As below.  Procedures Performed:  CT A/P: IMPRESSION: 1. Left paraspinal collection at the S1 level, contiguous with a large bony defect surrounding the left S1 fixation screw with adjacent sacral sclerosis. This is suspicious for infection and abscess. This does not appear significantly changed from the 2017 MR examinations. 2. Patchy airspace opacities in the right lung base. This could represent pneumonia or aspiration. 3. 2.5 cm urinary bladder calculus. 4. Low-attenuation left renal lesions, unchanged from prior imaging studies and more likely benign. 5. Aortic atherosclerosis.   2D Echo: Study Conclusions - Left ventricle: The cavity size was normal. Systolic function was   normal. The estimated ejection fraction was in the range of 60%  to 65%. Wall motion was normal; there were no regional wall   motion abnormalities. Left ventricular diastolic function   parameters were normal. - Aortic valve: Calcified non coronary cusp. - Mitral valve: Calcified annulus. - Left atrium: The atrium was mildly dilated. - Atrial septum: No defect or patent foramen ovale was identified. - Tricuspid valve: There was mild-moderate regurgitation. - Pulmonary arteries: PA peak pressure: 38 mm Hg (S). - Impressions: Normal GLS -23.  Impressions: - Normal GLS -23. There was no evidence of a vegetation.  Consultations: Neurosurgery  Discharge Instructions: Discharge Instructions    Call MD for:  persistant dizziness or light-headedness    Complete by:  As directed    Call MD for:  redness, tenderness, or signs of infection (pain, swelling, redness, odor or green/yellow discharge around incision site)    Complete by:  As  directed    Call MD for:  severe uncontrolled pain    Complete by:  As directed    Call MD for:  temperature >100.4    Complete by:  As directed    Diet - low sodium heart healthy    Complete by:  As directed    Discharge instructions    Complete by:  As directed    You had an infection in your lungs which we are treating with antibiotics. Continue to take the Augmentin by mouth for 3 more days.  If you develop any signs of fever, chills, worsening cough, or worsening of pain or swelling in your back, please return to the ED for evaluation as you are at high risk for another infection.  You did have a positive test for Hepatitis C, however, a repeat test shows that you do not have an active infection. This means that you had an infection in the past, but your immune system has cleared this disease without treatment. No further treatment should be needed for this.   Increase activity slowly    Complete by:  As directed       Signed: Holley Raring, MD 01/18/2017, 11:51 AM   Pager: [phone]

## 2017-01-18 NOTE — Progress Notes (Addendum)
Subjective: Currently, the patient is feeling much better with return to baseline. No SOB and cough resolved. Still with some TTP around lumbar abcess but this is reportedly improved from 10 days prior. Agreeable to DC.  Pt was made aware of HCV antibody test and pending quant PCR. Pt is aware that cure is available for Hep C if he were to enroll in a substance abuse program. He will f/u with his PCP.  Objective: Vital signs in last 24 hours: Vitals:   01/17/17 1339 01/17/17 2128 01/18/17 0617 01/18/17 0828  BP: 135/83 119/83 135/82 140/76  Pulse: 76 69 65   Resp: '16 18 18   '$ Temp: 98.5 F (36.9 C) 98 F (36.7 C) 98.2 F (36.8 C)   TempSrc:  Oral Oral   SpO2: 100% 97% 99%   Weight:      Height:       Physical Exam: Physical Exam  Constitutional: He is oriented to person, place, and time. No distress.  Cardiovascular: Normal rate, regular rhythm and normal heart sounds.   No murmur heard. Pulmonary/Chest: Effort normal and breath sounds normal. No respiratory distress. He has no wheezes. He has no rales.  Abdominal: Soft. Bowel sounds are normal. There is no tenderness.  Musculoskeletal: He exhibits no edema or tenderness.  Neurological: He is alert and oriented to person, place, and time.  Skin: Skin is warm and dry.  Large tender paraspinal abscess over the lumbar spine without obvious drainage. Erythema surround fluctuant mass. Midline scar from prior lumbar surgery. Multiple recent track marks on the LUE forearm, several on RUE, w/o significant tenderness or erythema.   Labs: CBC:  Recent Labs Lab 01/16/17 0223 01/16/17 0417  WBC 5.6 5.3  NEUTROABS  --  3.7  HGB 10.4* 10.2*  HCT 31.3* 29.9*  MCV 80.5 80.6  PLT 003 491   Metabolic Panel:  Recent Labs Lab 01/16/17 0223 01/16/17 0417 01/18/17 0737  NA 132*  --  135  K 3.7  --  3.5  CL 99*  --  106  CO2 24  --  24  GLUCOSE 124*  --  96  BUN 12  --  9  CREATININE 0.69  --  0.68  CALCIUM 8.7*  --  8.3*    ALT  --  27  --   ALKPHOS  --  84  --   BILITOT  --  1.3*  --   PROT  --  6.8  --   ALBUMIN  --  3.1*  --    Microbiology: BCx x2 - pending NGTD  Imaging: CT A/P: paraspinal lumbar abscess w/ tracking to prior surgical site and displaced surgical screws, appearance chronic and unchanged from imaging on MR in 2017.   Medications: Scheduled Medications: . enoxaparin (LOVENOX) injection  40 mg Subcutaneous Q24H  . lisinopril  40 mg Oral Daily  . piperacillin-tazobactam (ZOSYN)  IV  3.375 g Intravenous Q8H  . sodium chloride flush  3 mL Intravenous Q12H  . vancomycin  750 mg Intravenous Q12H   PRN Medications: acetaminophen **OR** acetaminophen, dextromethorphan, oxyCODONE-acetaminophen  Assessment/Plan: Pt is a 56 y.o. 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 Presentation is highly concerning for infection, w/ multiple potential sources.  1) Sepsis from CAP: Initially with multiple potential sources. Pt w/ mild resp sx and CXR concerning  for R PNA, crackles initially now resolved w/ all resp sx. Several areas of erythema around injection sites but low suscpicion for cellulitis currently. Chronic extensive paraspinal abscess, w/ reports of recent drainage, had I&D several weeks ago. Pt reports that this is much improved from 10 days prior. Mildly elevated ESR, although imaging appears stable. Neurosurg recs no intervention, suspect chronic process not responsible for acute illness. High risk for endocarditis, negative TTE, BCx neg x 2 days. Currently on broad spectrum Abx coverage x 2 days, transition to oral augmentin for CAP coverage at discharge x 3 additional days. Remains afebrile since admission. No leukocytosis. HDS. 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 He does have hardware in the lumbosacral spine. He had a MR lumbar spine in June 2017 which showed a large fluid collection around the left posterolateral rod extending to the left posterior subcutaneous tissues as well as a smaller fluid collection on the right side. CT abd/pelvis today does not show significant changes from this MR scan indicating this is likely a chronic issue. Neurosurgery was consulted by the ED and they feel this is a chronic issue and related to hardware malfunction. If any surgical intervention is required in the future, they recommend return to [facility] where his prior surgeries were preformed for multispecialty support. - plan as above  3) Urinary Bladder Calculus: Noted on CT abd/pelvis. Calculus is 2.5 cm in size. He also has several left kidney lesions that have been noted on prior imaging that are thought to be benign. Patient with numerous RBCs on his UA today. Microscopic hematuria noted on prior UA in Feb this year but has been normal otherwise. Patient denies gross hematuria. He has a chronic anemia that has been stable since at least 2015. Would benefit from Urology referral as an outpatient.  4) HTN: BP borderline low on admission in 90Z systolic, now stabilized w/ IVF. He is on Lisinopril 40 mg daily at home. - Restart home Lisinopril  5) Polysubstance abuse: Hx of IV heroin abuse as well as marijuana. Patient reports daily heroin use for several years. Has tried a rehab program before but was not successful. Multiple track marks noted on exam. UDS pos for opiates. We discussed the risks of continued IV heroin abuse, especially in setting of chronic narcotic therapy. HIV negative. HCV Ab pos, see below.  6) HCV: Pt with high titer of pos HCV Ab suggesting current or past infection. Will order quant HCV PCR and further testing if positive. No signs of  cirrhosis or significant LFT abnormalities on CT A/P. - HCV quant PCR  Length of Stay: 2 day(s) Dispo: Anticipated discharge today  Holley Raring, MD Pager: [phone] (7AM-5PM) 01/18/2017, 10:34 AM

## 2017-01-18 NOTE — Progress Notes (Signed)
NURSING PROGRESS NOTE  Wyline MoodJackie R Goette [id number] Discharge Data: 01/18/2017 1:42 PM Attending Provider: Gust RungErik C Hoffman, DO OZH:YQMVPCP:Barr, Raynelle FanningJulie, NP     Wyline MoodJackie R Brinker to be D/C'd Home per MD order.  Discussed with the patient the After Visit Summary and all questions fully answered. All IV's discontinued with no bleeding noted. All belongings returned to patient for patient to take home.   Last Vital Signs:  Blood pressure 140/76, pulse 65, temperature 98.2 F (36.8 C), temperature source Oral, resp. rate 18, height 6' (1.829 m), weight 61.1 kg (134 lb 11.2 oz), SpO2 99 %.  Discharge Medication List Allergies as of 01/18/2017      Reactions   Motrin [ibuprofen] Nausea Only      Medication List    STOP taking these medications   acetaminophen 500 MG tablet Commonly known as:  TYLENOL   cephALEXin 500 MG capsule Commonly known as:  KEFLEX   LORazepam 0.5 MG tablet Commonly known as:  ATIVAN   oxycodone 30 MG immediate release tablet Commonly known as:  ROXICODONE     TAKE these medications   amoxicillin-clavulanate 875-125 MG tablet Commonly known as:  AUGMENTIN Take 1 tablet by mouth 2 (two) times daily.   lisinopril 40 MG tablet Commonly known as:  PRINIVIL,ZESTRIL Take 40 mg by mouth daily.

## 2017-01-21 LAB — CULTURE, BLOOD (ROUTINE X 2)
Culture: NO GROWTH
Culture: NO GROWTH

## 2017-02-01 ENCOUNTER — Ambulatory Visit (HOSPITAL_COMMUNITY): Payer: Medicaid Other

## 2017-02-01 ENCOUNTER — Encounter (HOSPITAL_COMMUNITY): Payer: Self-pay

## 2017-05-14 ENCOUNTER — Encounter (HOSPITAL_COMMUNITY): Payer: Self-pay | Admitting: Emergency Medicine

## 2017-05-14 DIAGNOSIS — E871 Hypo-osmolality and hyponatremia: Secondary | ICD-10-CM | POA: Diagnosis not present

## 2017-05-14 DIAGNOSIS — Z590 Homelessness: Secondary | ICD-10-CM | POA: Insufficient documentation

## 2017-05-14 DIAGNOSIS — D649 Anemia, unspecified: Secondary | ICD-10-CM | POA: Diagnosis not present

## 2017-05-14 DIAGNOSIS — B9561 Methicillin susceptible Staphylococcus aureus infection as the cause of diseases classified elsewhere: Secondary | ICD-10-CM | POA: Insufficient documentation

## 2017-05-14 DIAGNOSIS — F1190 Opioid use, unspecified, uncomplicated: Secondary | ICD-10-CM | POA: Diagnosis not present

## 2017-05-14 DIAGNOSIS — I10 Essential (primary) hypertension: Secondary | ICD-10-CM | POA: Diagnosis not present

## 2017-05-14 DIAGNOSIS — M545 Low back pain: Secondary | ICD-10-CM | POA: Diagnosis present

## 2017-05-14 DIAGNOSIS — G061 Intraspinal abscess and granuloma: Principal | ICD-10-CM | POA: Insufficient documentation

## 2017-05-14 DIAGNOSIS — Z79899 Other long term (current) drug therapy: Secondary | ICD-10-CM | POA: Insufficient documentation

## 2017-05-14 DIAGNOSIS — G8929 Other chronic pain: Secondary | ICD-10-CM | POA: Diagnosis not present

## 2017-05-14 DIAGNOSIS — Z89512 Acquired absence of left leg below knee: Secondary | ICD-10-CM | POA: Insufficient documentation

## 2017-05-14 DIAGNOSIS — M6289 Other specified disorders of muscle: Secondary | ICD-10-CM | POA: Diagnosis present

## 2017-05-14 LAB — BASIC METABOLIC PANEL
ANION GAP: 8 (ref 5–15)
BUN: 14 mg/dL (ref 6–20)
CALCIUM: 8.7 mg/dL — AB (ref 8.9–10.3)
CO2: 26 mmol/L (ref 22–32)
Chloride: 98 mmol/L — ABNORMAL LOW (ref 101–111)
Creatinine, Ser: 0.76 mg/dL (ref 0.61–1.24)
GFR calc Af Amer: >60 mL/min (ref >60)
GFR calc non Af Amer: >60 mL/min (ref >60)
GLUCOSE: 135 mg/dL — AB (ref 65–99)
POTASSIUM: 3.8 mmol/L (ref 3.5–5.1)
Sodium: 132 mmol/L — ABNORMAL LOW (ref 135–145)

## 2017-05-14 LAB — CBC
HEMATOCRIT: 34.3 % — AB (ref 39.0–52.0)
Hemoglobin: 11.2 g/dL — ABNORMAL LOW (ref 13.0–17.0)
MCH: 24.6 pg — ABNORMAL LOW (ref 26.0–34.0)
MCHC: 32.7 g/dL (ref 30.0–36.0)
MCV: 75.2 fL — AB (ref 78.0–100.0)
Platelets: 173 10*3/uL (ref 150–400)
RBC: 4.56 MIL/uL (ref 4.22–5.81)
RDW: 15.6 % — AB (ref 11.5–15.5)
WBC: 7.6 10*3/uL (ref 4.0–10.5)

## 2017-05-14 NOTE — ED Triage Notes (Addendum)
Pt presents to ED with [facility] for assessment of left lower back painx 3-4 months with a deformity noted that is wrapped.  Pt able to ambulate with assistance with [facility]K amputation.  Denies injury.  Pt has large abscess noted to the left lower back, states it just started draining 2 days ago.

## 2017-05-15 ENCOUNTER — Encounter (HOSPITAL_COMMUNITY): Payer: Self-pay | Admitting: Internal Medicine

## 2017-05-15 ENCOUNTER — Observation Stay (HOSPITAL_COMMUNITY)
Admission: EM | Admit: 2017-05-15 | Discharge: 2017-05-16 | Discharge status: Against Medical Advice | Payer: Medicaid Other | Attending: Internal Medicine | Admitting: Internal Medicine

## 2017-05-15 ENCOUNTER — Emergency Department (HOSPITAL_COMMUNITY): Payer: Medicaid Other

## 2017-05-15 DIAGNOSIS — Z89512 Acquired absence of left leg below knee: Secondary | ICD-10-CM | POA: Diagnosis not present

## 2017-05-15 DIAGNOSIS — IMO0002 Reserved for concepts with insufficient information to code with codable children: Secondary | ICD-10-CM

## 2017-05-15 DIAGNOSIS — M545 Low back pain, unspecified: Secondary | ICD-10-CM | POA: Diagnosis present

## 2017-05-15 DIAGNOSIS — M4620 Osteomyelitis of vertebra, site unspecified: Secondary | ICD-10-CM | POA: Diagnosis present

## 2017-05-15 DIAGNOSIS — F1190 Opioid use, unspecified, uncomplicated: Secondary | ICD-10-CM | POA: Diagnosis present

## 2017-05-15 DIAGNOSIS — E871 Hypo-osmolality and hyponatremia: Secondary | ICD-10-CM

## 2017-05-15 DIAGNOSIS — M6008 Infective myositis, other site: Secondary | ICD-10-CM

## 2017-05-15 DIAGNOSIS — D649 Anemia, unspecified: Secondary | ICD-10-CM | POA: Diagnosis present

## 2017-05-15 DIAGNOSIS — M6289 Other specified disorders of muscle: Secondary | ICD-10-CM | POA: Diagnosis not present

## 2017-05-15 DIAGNOSIS — G8929 Other chronic pain: Secondary | ICD-10-CM

## 2017-05-15 DIAGNOSIS — I10 Essential (primary) hypertension: Secondary | ICD-10-CM | POA: Diagnosis not present

## 2017-05-15 HISTORY — DX: Osteomyelitis of vertebra, site unspecified: M46.20

## 2017-05-15 LAB — HEPATIC FUNCTION PANEL
ALT: 14 U/L — AB (ref 17–63)
AST: 23 U/L (ref 15–41)
Albumin: 2.6 g/dL — ABNORMAL LOW (ref 3.5–5.0)
Alkaline Phosphatase: 67 U/L (ref 38–126)
BILIRUBIN INDIRECT: 0.9 mg/dL (ref 0.3–0.9)
Bilirubin, Direct: 0.3 mg/dL (ref 0.1–0.5)
TOTAL PROTEIN: 6.1 g/dL — AB (ref 6.5–8.1)
Total Bilirubin: 1.2 mg/dL (ref 0.3–1.2)

## 2017-05-15 LAB — CBC WITH DIFFERENTIAL/PLATELET
Basophils Absolute: 0 10*3/uL (ref 0.0–0.1)
Basophils Relative: 0 %
EOS PCT: 3 %
Eosinophils Absolute: 0.2 10*3/uL (ref 0.0–0.7)
HCT: 33.1 % — ABNORMAL LOW (ref 39.0–52.0)
Hemoglobin: 10.8 g/dL — ABNORMAL LOW (ref 13.0–17.0)
LYMPHS ABS: 1.2 10*3/uL (ref 0.7–4.0)
LYMPHS PCT: 18 %
MCH: 24.4 pg — AB (ref 26.0–34.0)
MCHC: 32.6 g/dL (ref 30.0–36.0)
MCV: 74.9 fL — AB (ref 78.0–100.0)
MONO ABS: 0.9 10*3/uL (ref 0.1–1.0)
Monocytes Relative: 13 %
Neutro Abs: 4.4 10*3/uL (ref 1.7–7.7)
Neutrophils Relative %: 66 %
PLATELETS: 174 10*3/uL (ref 150–400)
RBC: 4.42 MIL/uL (ref 4.22–5.81)
RDW: 15.4 % (ref 11.5–15.5)
WBC: 6.7 10*3/uL (ref 4.0–10.5)

## 2017-05-15 LAB — MAGNESIUM: MAGNESIUM: 1.8 mg/dL (ref 1.7–2.4)

## 2017-05-15 LAB — I-STAT CG4 LACTIC ACID, ED: Lactic Acid, Venous: 0.43 mmol/L — ABNORMAL LOW (ref 0.5–1.9)

## 2017-05-15 MED ORDER — HYDROCODONE-ACETAMINOPHEN 5-325 MG PO TABS
1.0000 | ORAL_TABLET | ORAL | Status: DC | PRN
  Administered 2017-05-15 – 2017-05-16 (×5): 2 via ORAL
  Filled 2017-05-15 (×5): qty 2

## 2017-05-15 MED ORDER — GADOBENATE DIMEGLUMINE 529 MG/ML IV SOLN
10.0000 mL | Freq: Once | INTRAVENOUS | Status: AC | PRN
  Administered 2017-05-15: 10 mL via INTRAVENOUS

## 2017-05-15 MED ORDER — SODIUM CHLORIDE 0.9 % IV BOLUS (SEPSIS)
1000.0000 mL | Freq: Once | INTRAVENOUS | Status: AC
  Administered 2017-05-15: 1000 mL via INTRAVENOUS

## 2017-05-15 MED ORDER — ACETAMINOPHEN 325 MG PO TABS: 650.0000 mg | ORAL_TABLET | Freq: Four times a day (QID) | ORAL | Status: DC | PRN

## 2017-05-15 MED ORDER — ACETAMINOPHEN 650 MG RE SUPP: 650.0000 mg | Freq: Four times a day (QID) | RECTAL | Status: DC | PRN

## 2017-05-15 MED ORDER — IBUPROFEN 200 MG PO TABS
800.0000 mg | ORAL_TABLET | Freq: Once | ORAL | Status: DC
  Filled 2017-05-15: qty 1

## 2017-05-15 MED ORDER — NICOTINE 21 MG/24HR TD PT24
21.0000 mg | MEDICATED_PATCH | Freq: Every day | TRANSDERMAL | Status: DC
  Administered 2017-05-15 – 2017-05-16 (×2): 21 mg via TRANSDERMAL
  Filled 2017-05-15 (×2): qty 1

## 2017-05-15 MED ORDER — SODIUM CHLORIDE 0.9 % IV SOLN
INTRAVENOUS | Status: AC
  Administered 2017-05-15 (×2): via INTRAVENOUS

## 2017-05-15 MED ORDER — POLYETHYLENE GLYCOL 3350 17 G PO PACK: 17.0000 g | PACK | Freq: Every day | ORAL | Status: DC | PRN
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
## 2017-05-15 NOTE — ED Notes (Signed)
Dressed wound on left side of  butt cheek.

## 2017-05-15 NOTE — ED Notes (Signed)
Patient is currently sleeping.

## 2017-05-15 NOTE — ED Notes (Signed)
Pt taken to MRI  

## 2017-05-16 LAB — BASIC METABOLIC PANEL
ANION GAP: 7 (ref 5–15)
BUN: 10 mg/dL (ref 6–20)
CALCIUM: 8.1 mg/dL — AB (ref 8.9–10.3)
CO2: 24 mmol/L (ref 22–32)
Chloride: 105 mmol/L (ref 101–111)
Creatinine, Ser: 0.53 mg/dL — ABNORMAL LOW (ref 0.61–1.24)
GFR calc Af Amer: >60 mL/min (ref >60)
Glucose, Bld: 95 mg/dL (ref 65–99)
Potassium: 3.7 mmol/L (ref 3.5–5.1)
Sodium: 136 mmol/L (ref 135–145)

## 2017-05-16 LAB — CBC
HCT: 31 % — ABNORMAL LOW (ref 39.0–52.0)
HEMOGLOBIN: 9.9 g/dL — AB (ref 13.0–17.0)
MCH: 24 pg — ABNORMAL LOW (ref 26.0–34.0)
MCHC: 31.9 g/dL (ref 30.0–36.0)
MCV: 75.2 fL — ABNORMAL LOW (ref 78.0–100.0)
Platelets: 148 10*3/uL — ABNORMAL LOW (ref 150–400)
RBC: 4.12 MIL/uL — ABNORMAL LOW (ref 4.22–5.81)
RDW: 15.6 % — ABNORMAL HIGH (ref 11.5–15.5)
WBC: 5.2 10*3/uL (ref 4.0–10.5)

## 2017-05-16 LAB — HIV ANTIBODY (ROUTINE TESTING W REFLEX): HIV Screen 4th Generation wRfx: NONREACTIVE

## 2017-05-16 MED ORDER — HYDROCODONE-ACETAMINOPHEN 7.5-325 MG PO TABS
1.0000 | ORAL_TABLET | ORAL | Status: DC | PRN
  Administered 2017-05-16: 2 via ORAL
  Filled 2017-05-16: qty 2

## 2017-05-16 MED ORDER — OXYCODONE HCL 5 MG PO TABS
20.0000 mg | ORAL_TABLET | ORAL | Status: DC | PRN
  Administered 2017-05-16: 20 mg via ORAL
  Filled 2017-05-16: qty 4

## 2017-05-16 MED ORDER — CLONIDINE HCL 0.1 MG PO TABS
0.1000 mg | ORAL_TABLET | Freq: Two times a day (BID) | ORAL | Status: DC
  Administered 2017-05-16: 0.1 mg via ORAL
  Filled 2017-05-16: qty 1

## 2017-05-16 NOTE — Progress Notes (Signed)
   Subjective: [name] was complaining of continued back pain up to 9/10 in severity and starting to feel like he was having withdrawal symptoms particularly sweating and chills. His back pain remained his most severe symptom although he had been able to rest well overnight.  Objective:  Vital signs in last 24 hours: Vitals:   05/16/17 0110 05/16/17 0601 05/16/17 0901 05/16/17 1359  BP: 119/71 (!) 149/85 130/60 (!) 143/80  Pulse: 70 64 76 81  Resp: 18 18 16 20   Temp: 98.2 F (36.8 C) 97.9 F (36.6 C) 97.9 F (36.6 C) 98.4 F (36.9 C)  TempSrc: Oral Oral Oral Oral  SpO2: 98% 99% 100% 97%  Weight:      Height:       Physical Exam  Constitutional:  Thin, chronically ill appearing  Musculoskeletal:  RLE BKA  Neurological:  Faint bilateral intention tremor in hands  Skin: Skin is warm.  damp  Psychiatric:  Somewhat flat affect   Assessment/Plan: Paraspinal Abscess Chronic Low Back Pain 2/2 Trauma His pain remains poorly controlled this morning. He is unsatisfied with waiting here with no specific interventions started so far. Initial surface wound stain shows gram positive cocci with culture pending. [facility] and the ultimate plan is for transfer to undergo surgical treatment. We will escalate his oral opioid narcotics and start low dose clonidine as tolerated with withdrawal symptoms.  HTN Blood pressures are nearly normal here off medication.  Polysubstance Abuse Last heroin use was the day prior to admission. He expresses a desire to quit with a plan to do so unaided after getting his back fixed.  Fuller Planhristopher W Josiane Labine, MD PGY-III Internal Medicine Resident Pager# [phone] 05/16/2017, 12:30 PM    Interval addendum: [name] informed nursing staff that he did not wish to remain inpatient any longer. I spoke with him prior to leaving and he initially was agreeable to staying if we could try a higher  dose of opioid medications here for his pain and withdrawal symptoms. I did not formally complete a physical exam at the time, but he had a faint tremor and sweating consistent with mild opioid withdrawal. However shortly afterwards he again felt the need to leave and signed out of the hospital AMA quickly without waiting to discuss his care again before leaving.
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
## 2017-05-16 NOTE — Progress Notes (Signed)
Dr. Dimple Caseyice came and spoke with patient at this time. Patient is now staying. Awaiting for orders.  Sim BoastHavy, RN

## 2017-05-16 NOTE — Progress Notes (Signed)
Patient wants to leave AMA at this time. RN advise against it due to his medical condition. Risks explained. MD Paged.   Sim BoastHavy, RN

## 2017-05-16 NOTE — Progress Notes (Signed)
Patient is now deciding to leave AMA again and left with all his belongings. Dr. Dimple Caseyice aware.    Sim BoastHavy, RN

## 2017-05-17 LAB — AEROBIC CULTURE W GRAM STAIN (SUPERFICIAL SPECIMEN)

## 2017-05-17 LAB — AEROBIC CULTURE  (SUPERFICIAL SPECIMEN)

## 2017-05-17 NOTE — Discharge Summary (Signed)
Name: Ethan [name] MRN: [id number] DOB: [birth year] 56 y.o. PCP: Marletta Lor, NP  Date of Admission: 05/15/2017 12:56 AM Date of Discharge: 05/17/2017 Attending Physician: Dr. Rogelia Boga  Discharge Diagnosis:    Paraspinal abscess (HCC)   Anemia   Chronic lower back pain   Hypertension   Heroin use   Below knee amputation status (HCC)   Hyponatremia   Discharge Medications: Allergies as of 05/16/2017      Reactions   Motrin [ibuprofen] Nausea Only      Medication List    You have not been prescribed any medications.     Disposition and follow-up:   Ethan [name] was discharged from [facility] in Serious condition.  At the hospital follow up visit please address:  1.  Get [facility] to manage the paraspinal abscess, substance addiction and resources for treatment  2.  Labs / imaging needed at time of follow-up: bmp, cbc, blood culture  3.  Pending labs/ test needing follow-up: blood culture  Follow-up Appointments:   Unknown as patient left [facility]  Hospital Course by problem list:   Paraspinal abscess (HCC) Chronic lower back pain 2/2 Trauma Pt's paraspinal abscess was first noted on CT scan in 01/2017 at [facility]. He did not follow up for management of the abscess at [facility]. [facility] presented [facility] stating that he had lower back pain and his abscess busted and drained pus. MRI of lumbar spine on 7/3 revealed abscess posterior to the l5 and s1 screws with a fistula to the skin surface at s1 level, and some subcutanous abscesses extending from l3-l4. Neurosurgery was consulted and they felt it was best to transfer pt to [facility] as the abscesses are likely to be hardware related malformations. While waiting for transfer to [facility] we got superficial wound cultures and planned to start pt on antibiotics upon getting culture results back. Initial stain showed gram positive cocci.   Hypertension Blood pressures were well controlled during  admission ranging 110-140/70-80 He was not placed on any medication.    Substance abuse disorder  Pt is a longtime user of Heroin with the last time he used being 05/14/17. During this admission the pt developed withdrawal symptoms of tremor, sweating, chills, and pain. Clonidine added for withdrawal symptoms. Pt refused to stay in the hospital unless get gets higher dose pain medication and subsequently left ama. We got a HIV test which resulted negative and hcv in 01/2017 was also negative. Pt had expressed interest in quitting taking substances. He said he tried unsuccessfully in the past and preferred using methadone.    Discharge Vitals:    BP (!) 143/80 (BP Location: Left Arm)   Pulse 81   Temp 98.4 F (36.9 C) (Oral)   Resp 20   Ht 5\' 10"  (1.778 m)   Wt 120 lb 11.2 oz (54.7 kg)   SpO2 97%   BMI 17.32 kg/m   Pertinent Labs, Studies, and Procedures:   BMP (05/16/17): na=136, k=3.7, co2=24, glu=95, cr=0.53 Hepatic function panel (05/15/17): albumin=2.6, total protein=6.1 Blood culture (05/15/17)=no growth Wound or superficial culture=staph aureus Cbc(05/16/17)=hb=9.9, hct=31,mcv=75.2, rdw=15.6, platelets=148, wbc=5.2 HIV screen=non reactive   MRI Lumbar spine (05/15/17):  Impression-  1. Paraspinal muscle 15 mm rim enhancing fluid collection, likely abscess posterior to the right L5 screw. 2. Paraspinal muscle rim enhancing fluid collection, likely abscess posterior to the left L5 and S1 screws with fistula to skin surface at S1 level. 3. Small subcutaneous abscess in  the midline extending from L3-L4 levels. 4. Spine and spinal canal is largely obscured by susceptibility artifact from fusion hardware. Limited visibility at L4 through S1 does not demonstrate an epidural fluid collection or significant bone marrow edema.   Discharge Instructions:  Patient left against medical advice despite having the risks of doing this explained to him. We were not able to complete our medical  therapy and not able to to communicate any discharge instructions.    SignedLorenso Courier: Kayte Borchard, MD 05/17/2017, 3:35 PM   Pager: Pager: [phone]

## 2017-05-20 LAB — CULTURE, BLOOD (ROUTINE X 2)
CULTURE: NO GROWTH
Culture: NO GROWTH
Special Requests: ADEQUATE
Special Requests: ADEQUATE

## 2017-09-21 ENCOUNTER — Other Ambulatory Visit: Payer: Self-pay

## 2017-09-21 ENCOUNTER — Encounter (HOSPITAL_COMMUNITY): Payer: Self-pay | Admitting: Internal Medicine

## 2017-09-21 ENCOUNTER — Inpatient Hospital Stay (HOSPITAL_COMMUNITY)
Admission: EM | Admit: 2017-09-21 | Discharge: 2017-09-22 | DRG: 560 | Disposition: A | Discharge status: Other Acute Inpatient Hospital | Payer: Medicaid Other | Attending: Internal Medicine | Admitting: Internal Medicine

## 2017-09-21 ENCOUNTER — Emergency Department (HOSPITAL_COMMUNITY): Payer: Medicaid Other

## 2017-09-21 ENCOUNTER — Inpatient Hospital Stay (HOSPITAL_COMMUNITY): Payer: Medicaid Other

## 2017-09-21 DIAGNOSIS — T148XXA Other injury of unspecified body region, initial encounter: Secondary | ICD-10-CM | POA: Diagnosis not present

## 2017-09-21 DIAGNOSIS — F419 Anxiety disorder, unspecified: Secondary | ICD-10-CM | POA: Diagnosis present

## 2017-09-21 DIAGNOSIS — Z590 Homelessness: Secondary | ICD-10-CM | POA: Diagnosis not present

## 2017-09-21 DIAGNOSIS — F1110 Opioid abuse, uncomplicated: Secondary | ICD-10-CM | POA: Diagnosis present

## 2017-09-21 DIAGNOSIS — G939 Disorder of brain, unspecified: Secondary | ICD-10-CM | POA: Diagnosis present

## 2017-09-21 DIAGNOSIS — F17210 Nicotine dependence, cigarettes, uncomplicated: Secondary | ICD-10-CM | POA: Diagnosis present

## 2017-09-21 DIAGNOSIS — I10 Essential (primary) hypertension: Secondary | ICD-10-CM | POA: Diagnosis present

## 2017-09-21 DIAGNOSIS — R7881 Bacteremia: Secondary | ICD-10-CM | POA: Diagnosis present

## 2017-09-21 DIAGNOSIS — D509 Iron deficiency anemia, unspecified: Secondary | ICD-10-CM | POA: Diagnosis present

## 2017-09-21 DIAGNOSIS — M545 Low back pain: Secondary | ICD-10-CM | POA: Diagnosis not present

## 2017-09-21 DIAGNOSIS — L089 Local infection of the skin and subcutaneous tissue, unspecified: Secondary | ICD-10-CM | POA: Diagnosis present

## 2017-09-21 DIAGNOSIS — G8929 Other chronic pain: Secondary | ICD-10-CM | POA: Diagnosis present

## 2017-09-21 DIAGNOSIS — G629 Polyneuropathy, unspecified: Secondary | ICD-10-CM | POA: Diagnosis present

## 2017-09-21 DIAGNOSIS — D649 Anemia, unspecified: Secondary | ICD-10-CM | POA: Diagnosis present

## 2017-09-21 DIAGNOSIS — T8463XA Infection and inflammatory reaction due to internal fixation device of spine, initial encounter: Secondary | ICD-10-CM | POA: Diagnosis not present

## 2017-09-21 DIAGNOSIS — Y838 Other surgical procedures as the cause of abnormal reaction of the patient, or of later complication, without mention of misadventure at the time of the procedure: Secondary | ICD-10-CM | POA: Diagnosis present

## 2017-09-21 DIAGNOSIS — F1190 Opioid use, unspecified, uncomplicated: Secondary | ICD-10-CM | POA: Diagnosis present

## 2017-09-21 DIAGNOSIS — L02212 Cutaneous abscess of back [any part, except buttock]: Secondary | ICD-10-CM | POA: Diagnosis present

## 2017-09-21 DIAGNOSIS — G9389 Other specified disorders of brain: Secondary | ICD-10-CM | POA: Diagnosis present

## 2017-09-21 LAB — BLOOD CULTURE ID PANEL (REFLEXED)
Acinetobacter baumannii: NOT DETECTED
CANDIDA PARAPSILOSIS: NOT DETECTED
CANDIDA TROPICALIS: NOT DETECTED
Candida albicans: NOT DETECTED
Candida glabrata: NOT DETECTED
Candida krusei: NOT DETECTED
Enterobacter cloacae complex: NOT DETECTED
Enterobacteriaceae species: NOT DETECTED
Enterococcus species: NOT DETECTED
Escherichia coli: NOT DETECTED
HAEMOPHILUS INFLUENZAE: NOT DETECTED
KLEBSIELLA OXYTOCA: NOT DETECTED
KLEBSIELLA PNEUMONIAE: NOT DETECTED
Listeria monocytogenes: NOT DETECTED
METHICILLIN RESISTANCE: NOT DETECTED
NEISSERIA MENINGITIDIS: NOT DETECTED
Proteus species: NOT DETECTED
Pseudomonas aeruginosa: NOT DETECTED
SERRATIA MARCESCENS: NOT DETECTED
STAPHYLOCOCCUS AUREUS BCID: DETECTED — AB
STAPHYLOCOCCUS SPECIES: DETECTED — AB
STREPTOCOCCUS PYOGENES: NOT DETECTED
STREPTOCOCCUS SPECIES: NOT DETECTED
Streptococcus agalactiae: NOT DETECTED
Streptococcus pneumoniae: NOT DETECTED

## 2017-09-21 LAB — CBC WITH DIFFERENTIAL/PLATELET
BASOS PCT: 0 %
Basophils Absolute: 0 10*3/uL (ref 0.0–0.1)
EOS ABS: 0 10*3/uL (ref 0.0–0.7)
Eosinophils Relative: 0 %
HCT: 33.4 % — ABNORMAL LOW (ref 39.0–52.0)
Hemoglobin: 10.8 g/dL — ABNORMAL LOW (ref 13.0–17.0)
Lymphocytes Relative: 7 %
Lymphs Abs: 0.7 10*3/uL (ref 0.7–4.0)
MCH: 24.1 pg — ABNORMAL LOW (ref 26.0–34.0)
MCHC: 32.3 g/dL (ref 30.0–36.0)
MCV: 74.4 fL — ABNORMAL LOW (ref 78.0–100.0)
MONO ABS: 0.8 10*3/uL (ref 0.1–1.0)
MONOS PCT: 8 %
Neutro Abs: 8.5 10*3/uL — ABNORMAL HIGH (ref 1.7–7.7)
Neutrophils Relative %: 85 %
Platelets: 187 10*3/uL (ref 150–400)
RBC: 4.49 MIL/uL (ref 4.22–5.81)
RDW: 16.3 % — AB (ref 11.5–15.5)
WBC: 10.1 10*3/uL (ref 4.0–10.5)

## 2017-09-21 LAB — URINALYSIS, ROUTINE W REFLEX MICROSCOPIC
BILIRUBIN URINE: NEGATIVE
Bacteria, UA: NONE SEEN
GLUCOSE, UA: NEGATIVE mg/dL
Hgb urine dipstick: NEGATIVE
KETONES UR: NEGATIVE mg/dL
Nitrite: NEGATIVE
PH: 5 (ref 5.0–8.0)
PROTEIN: 30 mg/dL — AB
Specific Gravity, Urine: 1.038 — ABNORMAL HIGH (ref 1.005–1.030)

## 2017-09-21 LAB — COMPREHENSIVE METABOLIC PANEL
ALBUMIN: 2.7 g/dL — AB (ref 3.5–5.0)
ALK PHOS: 91 U/L (ref 38–126)
ALT: 22 U/L (ref 17–63)
AST: 28 U/L (ref 15–41)
Anion gap: 8 (ref 5–15)
BUN: 19 mg/dL (ref 6–20)
CALCIUM: 8.3 mg/dL — AB (ref 8.9–10.3)
CO2: 25 mmol/L (ref 22–32)
CREATININE: 0.69 mg/dL (ref 0.61–1.24)
Chloride: 102 mmol/L (ref 101–111)
GFR calc non Af Amer: >60 mL/min (ref >60)
GLUCOSE: 134 mg/dL — AB (ref 65–99)
Potassium: 3.3 mmol/L — ABNORMAL LOW (ref 3.5–5.1)
SODIUM: 135 mmol/L (ref 135–145)
Total Bilirubin: 1 mg/dL (ref 0.3–1.2)
Total Protein: 7.4 g/dL (ref 6.5–8.1)

## 2017-09-21 LAB — I-STAT CG4 LACTIC ACID, ED: Lactic Acid, Venous: 0.81 mmol/L (ref 0.5–1.9)

## 2017-09-21 LAB — RAPID URINE DRUG SCREEN, HOSP PERFORMED
Amphetamines: POSITIVE — AB
BARBITURATES: NOT DETECTED
Benzodiazepines: POSITIVE — AB
COCAINE: POSITIVE — AB
Opiates: POSITIVE — AB
TETRAHYDROCANNABINOL: POSITIVE — AB

## 2017-09-21 LAB — SEDIMENTATION RATE: Sed Rate: 59 mm/hr — ABNORMAL HIGH (ref 0–16)

## 2017-09-21 MED ORDER — PIPERACILLIN-TAZOBACTAM 3.375 G IVPB
3.3750 g | Freq: Three times a day (TID) | INTRAVENOUS | Status: DC
  Administered 2017-09-21 – 2017-09-22 (×3): 3.375 g via INTRAVENOUS
  Filled 2017-09-21 (×4): qty 50

## 2017-09-21 MED ORDER — SODIUM CHLORIDE 0.9 % IV BOLUS (SEPSIS)
1000.0000 mL | Freq: Once | INTRAVENOUS | Status: AC
Start: 2017-09-21 — End: 2017-09-21
  Administered 2017-09-21: 1000 mL via INTRAVENOUS

## 2017-09-21 MED ORDER — DIPHENHYDRAMINE HCL 50 MG/ML IJ SOLN
12.5000 mg | Freq: Once | INTRAMUSCULAR | Status: AC
  Administered 2017-09-21: 11:00:00 12.5 mg via INTRAVENOUS
  Filled 2017-09-21: qty 1

## 2017-09-21 MED ORDER — ENSURE ENLIVE PO LIQD: 237.0000 mL | Freq: Two times a day (BID) | ORAL | Status: DC

## 2017-09-21 MED ORDER — ONDANSETRON HCL 4 MG PO TABS: 4.0000 mg | ORAL_TABLET | Freq: Four times a day (QID) | ORAL | Status: DC | PRN

## 2017-09-21 MED ORDER — ADULT MULTIVITAMIN W/MINERALS CH
1.0000 | ORAL_TABLET | Freq: Every day | ORAL | Status: DC
  Administered 2017-09-21: 10:00:00 1 via ORAL
  Filled 2017-09-21: qty 1

## 2017-09-21 MED ORDER — ONDANSETRON HCL 4 MG/2ML IJ SOLN
4.0000 mg | Freq: Once | INTRAMUSCULAR | Status: AC
  Administered 2017-09-21: 4 mg via INTRAVENOUS
  Filled 2017-09-21: qty 2

## 2017-09-21 MED ORDER — LORAZEPAM 2 MG/ML IJ SOLN
1.0000 mg | Freq: Four times a day (QID) | INTRAMUSCULAR | Status: DC | PRN
Start: 2017-09-21 — End: 2017-09-22
  Administered 2017-09-21 – 2017-09-22 (×3): 1 mg via INTRAVENOUS
  Filled 2017-09-21 (×3): qty 1

## 2017-09-21 MED ORDER — KETOROLAC TROMETHAMINE 30 MG/ML IJ SOLN
30.0000 mg | Freq: Three times a day (TID) | INTRAMUSCULAR | Status: DC | PRN
  Administered 2017-09-21 – 2017-09-22 (×3): 30 mg via INTRAVENOUS
  Filled 2017-09-21 (×3): qty 1

## 2017-09-21 MED ORDER — POTASSIUM CHLORIDE CRYS ER 20 MEQ PO TBCR
40.0000 meq | EXTENDED_RELEASE_TABLET | Freq: Once | ORAL | Status: AC
  Administered 2017-09-21: 40 meq via ORAL
  Filled 2017-09-21: qty 2

## 2017-09-21 MED ORDER — ACETAMINOPHEN 650 MG RE SUPP: 650.0000 mg | Freq: Four times a day (QID) | RECTAL | Status: DC | PRN

## 2017-09-21 MED ORDER — ACETAMINOPHEN 325 MG PO TABS: 650.0000 mg | ORAL_TABLET | Freq: Four times a day (QID) | ORAL | Status: DC | PRN

## 2017-09-21 MED ORDER — MORPHINE SULFATE (PF) 4 MG/ML IV SOLN
4.0000 mg | Freq: Once | INTRAVENOUS | Status: AC
  Administered 2017-09-21: 4 mg via INTRAVENOUS
  Filled 2017-09-21: qty 1

## 2017-09-21 MED ORDER — VANCOMYCIN HCL IN DEXTROSE 1-5 GM/200ML-% IV SOLN
1000.0000 mg | Freq: Once | INTRAVENOUS | Status: AC
  Administered 2017-09-21: 1000 mg via INTRAVENOUS
  Filled 2017-09-21: qty 200

## 2017-09-21 MED ORDER — GADOBENATE DIMEGLUMINE 529 MG/ML IV SOLN
15.0000 mL | Freq: Once | INTRAVENOUS | Status: AC | PRN
  Administered 2017-09-21: 12:00:00 12 mL via INTRAVENOUS

## 2017-09-21 MED ORDER — THIAMINE HCL 100 MG/ML IJ SOLN
100.0000 mg | Freq: Every day | INTRAMUSCULAR | Status: DC
  Filled 2017-09-21 (×2): qty 1

## 2017-09-21 MED ORDER — PIPERACILLIN-TAZOBACTAM 3.375 G IVPB 30 MIN
3.3750 g | Freq: Once | INTRAVENOUS | Status: AC
  Administered 2017-09-21: 3.375 g via INTRAVENOUS
  Filled 2017-09-21: qty 50

## 2017-09-21 MED ORDER — ONDANSETRON HCL 4 MG/2ML IJ SOLN: 4.0000 mg | Freq: Four times a day (QID) | INTRAMUSCULAR | Status: DC | PRN

## 2017-09-21 MED ORDER — LORAZEPAM 1 MG PO TABS: 1.0000 mg | ORAL_TABLET | Freq: Four times a day (QID) | ORAL | Status: DC | PRN

## 2017-09-21 MED ORDER — SODIUM CHLORIDE 0.9 % IV SOLN
INTRAVENOUS | Status: AC
  Administered 2017-09-21 (×2): via INTRAVENOUS

## 2017-09-21 MED ORDER — VANCOMYCIN HCL IN DEXTROSE 750-5 MG/150ML-% IV SOLN
750.0000 mg | Freq: Three times a day (TID) | INTRAVENOUS | Status: DC
  Administered 2017-09-21 – 2017-09-22 (×3): 750 mg via INTRAVENOUS
  Filled 2017-09-21 (×4): qty 150

## 2017-09-21 MED ORDER — KETOROLAC TROMETHAMINE 30 MG/ML IJ SOLN
30.0000 mg | Freq: Once | INTRAMUSCULAR | Status: AC
  Administered 2017-09-21: 30 mg via INTRAVENOUS
  Filled 2017-09-21: qty 1

## 2017-09-21 MED ORDER — LORAZEPAM 2 MG/ML IJ SOLN
2.0000 mg | Freq: Once | INTRAMUSCULAR | Status: AC
  Administered 2017-09-21: 11:00:00 2 mg via INTRAVENOUS
  Filled 2017-09-21: qty 1

## 2017-09-21 MED ORDER — FOLIC ACID 1 MG PO TABS
1.0000 mg | ORAL_TABLET | Freq: Every day | ORAL | Status: DC
  Administered 2017-09-21: 1 mg via ORAL
  Filled 2017-09-21: qty 1

## 2017-09-21 MED ORDER — VITAMIN B-1 100 MG PO TABS
100.0000 mg | ORAL_TABLET | Freq: Every day | ORAL | Status: DC
  Administered 2017-09-21: 100 mg via ORAL
  Filled 2017-09-21: qty 1

## 2017-09-21 NOTE — Progress Notes (Signed)
A consult was received from an ED physician for Vancomycin and Zosyn per pharmacy dosing.  The patient's profile has been reviewed for ht/wt/allergies/indication/available labs.   A one time order has been placed for Vancomycin 1gm iv x1, Zosyn 3.375gm iv x1.  Further antibiotics/pharmacy consults should be ordered by admitting physician if indicated.                       Thank you, Aleene DavidsonGrimsley Jr, Yaslyn Cumby Crowford 09/21/2017  3:16 AM

## 2017-09-21 NOTE — Progress Notes (Signed)
Pharmacy Antibiotic Note  Ethan [name] is a 56 y.o. male admitted on 09/21/2017 with Wound infection.  Pharmacy has been consulted for Vancomycin and Zosyn dosing.  Plan: Zosyn 3.375g IV q8h (4 hour infusion).  Vancomycin 1gm iv x1, then 750mg  iv q8hr Goal AUC = 400 - 500 for all indications, except meningitis (goal AUC > 500 and Cmin 15-20 mcg/mL)   Weight: 150 lb (68 kg)(estimated and per patient)  Temp (24hrs), Avg:100 F (37.8 C), Min:99.3 F (37.4 C), Max:100.7 F (38.2 C)  Recent Labs  Lab 09/21/17 0314 09/21/17 0329  WBC 10.1  --   CREATININE 0.69  --   LATICACIDVEN  --  0.81    Estimated Creatinine Clearance: 99.2 mL/min (by C-G formula based on SCr of 0.69 mg/dL).    Allergies  Allergen Reactions  . Motrin [Ibuprofen] Nausea Only    Antimicrobials this admission: Vancomycin 09/21/2017 >> Zosyn 09/21/2017 >>   Dose adjustments this admission: -  Microbiology results: pending  Thank you for allowing pharmacy to be a part of this patient's care.  Aleene DavidsonGrimsley Jr, Lenisha Lacap Crowford 09/21/2017 6:26 AM

## 2017-09-21 NOTE — Progress Notes (Signed)
PHARMACY - PHYSICIAN COMMUNICATION CRITICAL VALUE ALERT - BLOOD CULTURE IDENTIFICATION (BCID)  Results for orders placed or performed during the hospital encounter of 09/21/17  Blood Culture ID Panel (Reflexed) (Collected: 09/21/2017  3:14 AM)  Result Value Ref Range   Enterococcus species NOT DETECTED NOT DETECTED   Listeria monocytogenes NOT DETECTED NOT DETECTED   Staphylococcus species DETECTED (A) NOT DETECTED   Staphylococcus aureus DETECTED (A) NOT DETECTED   Methicillin resistance NOT DETECTED NOT DETECTED   Streptococcus species NOT DETECTED NOT DETECTED   Streptococcus agalactiae NOT DETECTED NOT DETECTED   Streptococcus pneumoniae NOT DETECTED NOT DETECTED   Streptococcus pyogenes NOT DETECTED NOT DETECTED   Acinetobacter baumannii NOT DETECTED NOT DETECTED   Enterobacteriaceae species NOT DETECTED NOT DETECTED   Enterobacter cloacae complex NOT DETECTED NOT DETECTED   Escherichia coli NOT DETECTED NOT DETECTED   Klebsiella oxytoca NOT DETECTED NOT DETECTED   Klebsiella pneumoniae NOT DETECTED NOT DETECTED   Proteus species NOT DETECTED NOT DETECTED   Serratia marcescens NOT DETECTED NOT DETECTED   Haemophilus influenzae NOT DETECTED NOT DETECTED   Neisseria meningitidis NOT DETECTED NOT DETECTED   Pseudomonas aeruginosa NOT DETECTED NOT DETECTED   Candida albicans NOT DETECTED NOT DETECTED   Candida glabrata NOT DETECTED NOT DETECTED   Candida krusei NOT DETECTED NOT DETECTED   Candida parapsilosis NOT DETECTED NOT DETECTED   Candida tropicalis NOT DETECTED NOT DETECTED    Name of physician (or Provider) ContactedToniann Fail: Kakrakandy  Changes to prescribed antibiotics required: Due to spinal abscess, elected to continue vanc/Zosyn despite staph aureus blood cx  Berkley HarveyLegge, Xavion Muscat Marshall 09/21/2017  9:29 PM

## 2017-09-21 NOTE — ED Provider Notes (Addendum)
[facility]-EMERGENCY DEPT Provider Note   CSN: [id number] date & time: 09/21/17  0158     History   Chief Complaint Chief Complaint  Patient presents with  . Wound Infection    HPI Ethan [name] is a 56 y.o. male.  The history is provided by the patient.  He has history of heroin use, hypertension, paraspinal abscess, below the knee amputation on the left.  He comes in with exacerbation of chronic back pain.  He states that his back is been hurting him for several months, but is worse tonight.  He has had subjective fever but no chills or sweats.  Pain is rated at 9/10.  He has hardware in his back that had been placed following accident several years ago.  He had the hardware protrude from the skin last spring and was hospitalized with some improvement, but then started to breakthrough again and has been having some intermittent drainage.  He does admit to heroin use-2.5 g/day.  He denies heroin use today.  He denies other drug use.  He has not been taking anything for pain.   Past Medical History:  Diagnosis Date  . Anxiety   . Arthritis    "feet" (08/24/2014)  . Chronic lower back pain    "most of the time; sometimes it goes up into my upper back" (08/24/2014)  . Heroin use   . Hypertension   . Neuropathy   . Paraspinal abscess (HCC)   . Pneumonia 08/24/2014  . Renal insufficiency     Patient Active Problem List   Diagnosis Date Noted  . Hyponatremia 05/15/2017  . Hepatitis C infection 01/17/2017  . Paraspinal abscess (HCC) 01/16/2017  . Below knee amputation status (HCC) 11/03/2015  . Protein-calorie malnutrition, severe (HCC) 06/21/2015  . Chronic lower back pain 06/20/2015  . Hypertension 06/20/2015  . Heroin use 06/20/2015  . Brain mass 08/29/2014  . Community acquired pneumonia 08/24/2014  . Sepsis due to pneumonia (HCC) 08/24/2014  . Anemia 08/24/2014    Past Surgical History:  Procedure Laterality Date  . ABDOMINAL  SURGERY    . BACK SURGERY     JUMPED OFF A BRIDGE 2013  . BELOW KNEE LEG AMPUTATION Left 11/03/2015  . HERNIA REPAIR    . LEG SURGERY     PINS/ PLATES  . POSTERIOR LUMBAR FUSION  2013   "got a broken rod and screw in there now" (08/24/2014)       Home Medications    Prior to Admission medications   Not on File    Family History No family history on file.  Social History Social History   Tobacco Use  . Smoking status: Current Every Day Smoker    Packs/day: 1.50    Years: 45.00    Pack years: 67.50    Types: Cigarettes  . Smokeless tobacco: Never Used  Substance Use Topics  . Alcohol use: Yes    Comment: "08/24/2014 "1-3 drinks of  alcohol rarely"  . Drug use: Yes    Types: IV, Marijuana    Comment: heroin     Allergies   Motrin [ibuprofen]   Review of Systems Review of Systems  All other systems reviewed and are negative.    Physical Exam Updated Vital Signs BP 126/69 (BP Location: Left Arm)   Pulse 100   Temp (!) 100.7 F (38.2 C) (Oral)   Resp 20   SpO2 100%   Physical Exam  Nursing note and vitals reviewed.  56  year old male, who appears uncomfortable, but is in no acute distress. Vital signs are significant for fever. Oxygen saturation is 100%, which is normal. Head is normocephalic and atraumatic. PERRLA, EOMI. Oropharynx is clear. Neck is nontender and supple without adenopathy or JVD. Back: Metal hardware is seen protruding from the back on the left side just lateral to the lumbar spine.  There is an area of purulent drainage inferior to the hardware protruding from the skin. Lungs are clear without rales, wheezes, or rhonchi. Chest is nontender. Heart has regular rate and rhythm without murmur. Abdomen is soft, flat, nontender without masses or hepatosplenomegaly and peristalsis is normoactive. Extremities: Left below the knee amputation. Skin is warm and dry without rash. Neurologic: Mental status is normal, cranial nerves are intact,  there are no motor or sensory deficits.  ED Treatments / Results  Labs (all labs ordered are listed, but only abnormal results are displayed) Labs Reviewed  COMPREHENSIVE METABOLIC PANEL - Abnormal; Notable for the following components:      Result Value   Potassium 3.3 (*)    Glucose, Bld 134 (*)    Calcium 8.3 (*)    Albumin 2.7 (*)    All other components within normal limits  CBC WITH DIFFERENTIAL/PLATELET - Abnormal; Notable for the following components:   Hemoglobin 10.8 (*)    HCT 33.4 (*)    MCV 74.4 (*)    MCH 24.1 (*)    RDW 16.3 (*)    Neutro Abs 8.5 (*)    All other components within normal limits  SEDIMENTATION RATE - Abnormal; Notable for the following components:   Sed Rate 59 (*)    All other components within normal limits  CULTURE, BLOOD (ROUTINE X 2)  CULTURE, BLOOD (ROUTINE X 2)  AEROBIC CULTURE (SUPERFICIAL SPECIMEN)  URINALYSIS, ROUTINE W REFLEX MICROSCOPIC  RAPID URINE DRUG SCREEN, HOSP PERFORMED  I-STAT CG4 LACTIC ACID, ED    EKG  EKG Interpretation  Date/Time:  Friday September 21 2017 03:34:10 EST Ventricular Rate:  102 PR Interval:    QRS Duration: 83 QT Interval:  331 QTC Calculation: 432 R Axis:   81 Text Interpretation:  Sinus tachycardia Atrial premature complexes When compared with ECG of 01/16/2017, HEART RATE has increased Premature atrial complexes are now present Confirmed by Dione Booze ([id number]) on 09/21/2017 5:50:59 AM      Radiology Dg Chest Port 1 View  Result Date: 09/21/2017 CLINICAL DATA:  Fever EXAM: PORTABLE CHEST 1 VIEW COMPARISON:  06-14-202018 FINDINGS: Shallow inspiration. Heart size and pulmonary vascularity are normal for technique. No airspace disease or consolidation in the lungs. No blunting of costophrenic angles. No pneumothorax. Old left rib fractures. Postoperative changes in the thoracolumbar spine. Examination is limited due to patient rotation. IMPRESSION: Shallow inspiration.  No evidence of active pulmonary  disease. Electronically Signed   By: Burman Nieves M.D.   On: 09/21/2017 04:09    Procedures Procedures (including critical care time)  Medications Ordered in ED Medications  sodium chloride 0.9 % bolus 1,000 mL (not administered)  piperacillin-tazobactam (ZOSYN) IVPB 3.375 g (not administered)  vancomycin (VANCOCIN) IVPB 1000 mg/200 mL premix (not administered)     Initial Impression / Assessment and Plan / ED Course  I have reviewed the triage vital signs and the nursing notes.  Pertinent labs & imaging results that were available during my care of the patient were reviewed by me and considered in my medical decision making (see chart for details).  Patient with fever,  back pain, hardware protruding from the skin with purulent drainage.  Old records are reviewed, and he does have a history of paraspinal abscess related to this.  He does not have any SIRS criteria, so was not started on sepsis pathway.  However, lactic acid will be checked.  Cultures have been obtained.  He is started on empiric antibiotics of vancomycin and Zosyn.  He will need to have imaging to see if there is a deep space abscess.  WBC is normal, but with left shift.  Sedimentation rate is moderately elevated.  Mild anemia is present and unchanged from baseline.  Case is discussed with Dr. Toniann FailKakrakandy of [facility] who agrees to admit the patient.  Final Clinical Impressions(s) / ED Diagnoses   Final diagnoses:  Abscess of lower back  Microcytic anemia    ED Discharge Orders    None       Dione BoozeGlick, Ethan Bessler, MD 09/21/17 [id number]    Dione BoozeGlick, Ethan Lattin, MD 09/21/17 [phone]

## 2017-09-21 NOTE — ED Notes (Signed)
Bed ready at 0650 Rm 1606. ED RN have been made aware

## 2017-09-21 NOTE — Progress Notes (Signed)
Got phone call from [facility]. They stated that they had a number for Dr Edward JollySilva that was not working.  I informed them I would page him with their number, since I only had his pager number.  There number is [phone],

## 2017-09-21 NOTE — H&P (Signed)
History and Physical    Ethan MoodJackie R Walberg RUE:[id number]:[id number] DOB: 1961-03-03 DOA: 09/21/2017  PCP: Marletta LorBarr, Julie, NP  Patient coming from: Patient is homeless.  Chief Complaint: Low back pain and increasing discharge.  HPI: Ethan [name] is a 56 y.o. male with history of drug abuse and previous history of paraspinal abscess of the low back who had been to our hospital in July with similar complaints and signed out AMA presents back with worsening pain and swelling and discharge from the low back area.  Patient has not had a well placed in the lumbar spine after trauma as per the chart in 2012.  Has had subsequent complications with infection and abscess.  Patient did not very forthcoming with history at this time.  Denies any weakness of the lower extremities or difficulty walking.  Has increasing pain and discharge.  Patient is homeless and admits to taking IV drugs.  ED Course: In the ER on exam patient has hardware protruding from his low back with some discharge.  Patient is tachycardic and febrile.  Blood cultures were obtained along with sed rate and started on empiric antibiotics.  MRI has been ordered which is pending and based on which patient will have further plans.  Review of Systems: As per HPI, rest all negative.   Past Medical History:  Diagnosis Date  . Anxiety   . Arthritis    "feet" (08/24/2014)  . Chronic lower back pain    "most of the time; sometimes it goes up into my upper back" (08/24/2014)  . Heroin use   . Hypertension   . Neuropathy   . Paraspinal abscess (HCC)   . Pneumonia 08/24/2014  . Renal insufficiency     Past Surgical History:  Procedure Laterality Date  . ABDOMINAL SURGERY    . BACK SURGERY     JUMPED OFF A BRIDGE 2013  . BELOW KNEE LEG AMPUTATION Left 11/03/2015  . HERNIA REPAIR    . LEG SURGERY     PINS/ PLATES  . POSTERIOR LUMBAR FUSION  2013   "got a broken rod and screw in there now" (08/24/2014)     reports that he has been smoking  cigarettes.  He has a 67.50 pack-year smoking history. he has never used smokeless tobacco. He reports that he drinks alcohol. He reports that he uses drugs. Drugs: IV and Marijuana.  Allergies  Allergen Reactions  . Motrin [Ibuprofen] Nausea Only    Family History  Problem Relation Age of Onset  . Hypertension Other     Prior to Admission medications   Not on File    Physical Exam: Vitals:   09/21/17 0213 09/21/17 0227 09/21/17 0300 09/21/17 0507  BP: 126/69 126/69 (!) 143/77 108/69  Pulse: 96 100 80 100  Resp: 18 20 20 17   Temp: 99.3 F (37.4 C) (!) 100.7 F (38.2 C)    TempSrc: Oral Oral    SpO2: 96% 100% 96% 96%      Constitutional: Moderately built and nourished. Vitals:   09/21/17 0213 09/21/17 0227 09/21/17 0300 09/21/17 0507  BP: 126/69 126/69 (!) 143/77 108/69  Pulse: 96 100 80 100  Resp: 18 20 20 17   Temp: 99.3 F (37.4 C) (!) 100.7 F (38.2 C)    TempSrc: Oral Oral    SpO2: 96% 100% 96% 96%   Eyes: Anicteric no pallor. ENMT: No discharge from the ears eyes nose or mouth. Neck: No mass felt.  No neck rigidity. Respiratory: No rhonchi or crepitations.  Cardiovascular: S1-S2 heard no murmurs appreciated. Abdomen: Soft nontender bowel sounds present. Musculoskeletal: No edema.  No joint effusion. Skin: Low back has hardware protruding from the back with minimal discharge. Neurologic: Patient is mildly lethargic after receiving pain medication but oriented to time place and person moves all extremities. Psychiatric: Mildly lethargic.   Labs on Admission: I have personally reviewed following labs and imaging studies  CBC: Recent Labs  Lab 09/21/17 0314  WBC 10.1  NEUTROABS 8.5*  HGB 10.8*  HCT 33.4*  MCV 74.4*  PLT 187   Basic Metabolic Panel: Recent Labs  Lab 09/21/17 0314  NA 135  K 3.3*  CL 102  CO2 25  GLUCOSE 134*  BUN 19  CREATININE 0.69  CALCIUM 8.3*   GFR: CrCl cannot be calculated (Unknown ideal weight.). Liver Function  Tests: Recent Labs  Lab 09/21/17 0314  AST 28  ALT 22  ALKPHOS 91  BILITOT 1.0  PROT 7.4  ALBUMIN 2.7*   No results for input(s): LIPASE, AMYLASE in the last 168 hours. No results for input(s): AMMONIA in the last 168 hours. Coagulation Profile: No results for input(s): INR, PROTIME in the last 168 hours. Cardiac Enzymes: No results for input(s): CKTOTAL, CKMB, CKMBINDEX, TROPONINI in the last 168 hours. BNP (last 3 results) No results for input(s): PROBNP in the last 8760 hours. HbA1C: No results for input(s): HGBA1C in the last 72 hours. CBG: No results for input(s): GLUCAP in the last 168 hours. Lipid Profile: No results for input(s): CHOL, HDL, LDLCALC, TRIG, CHOLHDL, LDLDIRECT in the last 72 hours. Thyroid Function Tests: No results for input(s): TSH, T4TOTAL, FREET4, T3FREE, THYROIDAB in the last 72 hours. Anemia Panel: No results for input(s): VITAMINB12, FOLATE, FERRITIN, TIBC, IRON, RETICCTPCT in the last 72 hours. Urine analysis:    Component Value Date/Time   COLORURINE YELLOW January 15, 202018 0227   APPEARANCEUR CLEAR January 15, 202018 0227   LABSPEC 1.024 January 15, 202018 0227   PHURINE 6.0 January 15, 202018 0227   GLUCOSEU NEGATIVE January 15, 202018 0227   HGBUR LARGE (A) January 15, 202018 0227   BILIRUBINUR NEGATIVE January 15, 202018 0227   KETONESUR NEGATIVE January 15, 202018 0227   PROTEINUR 30 (A) January 15, 202018 0227   UROBILINOGEN 1.0 05/01/2015 0441   NITRITE NEGATIVE January 15, 202018 0227   LEUKOCYTESUR NEGATIVE January 15, 202018 0227   Sepsis Labs: @LABRCNTIP (procalcitonin:4,lacticidven:4) )No results found for this or any previous visit (from the past 240 hour(s)).   Radiological Exams on Admission: Dg Chest Port 1 View  Result Date: 09/21/2017 CLINICAL DATA:  Fever EXAM: PORTABLE CHEST 1 VIEW COMPARISON:  January 15, 202018 FINDINGS: Shallow inspiration. Heart size and pulmonary vascularity are normal for technique. No airspace disease or consolidation in the lungs. No blunting of costophrenic angles. No pneumothorax.  Old left rib fractures. Postoperative changes in the thoracolumbar spine. Examination is limited due to patient rotation. IMPRESSION: Shallow inspiration.  No evidence of active pulmonary disease. Electronically Signed   By: Burman Nieves M.D.   On: 09/21/2017 04:09     Assessment/Plan Principal Problem:   Wound infection Active Problems:   Anemia   Brain mass   Hypertension   Heroin use    1. Low back wound infection with history of paraspinal abscess and hardware from trauma -blood cultures were obtained along with sed rate and patient was started on empiric antibiotics since patient has early sepsis picture.  MRI of the L-spine has been ordered.  Following which we may have to discuss with neurosurgery. 2. History of IV drug abuse -placed on as needed Ativan for now.  Social work  consult. 3. History of hypertension -presently on no medications.  Closely follow blood pressure trends. 4. Chronic anemia -follow CBC.  5. History of brain mass per the chart.  I have reviewed patient's old charts and labs.   DVT prophylaxis: SCDs. Code Status: Full code. Family Communication: Discussed with patient. Disposition Plan: To be determined. Consults called: None. Admission status: Inpatient.   Eduard ClosKAKRAKANDY,Horice Carrero N. MD [facility] Pager [phone].  If 7PM-7AM, please contact night-coverage [email or web address] Password [email or web address]  09/21/2017, 5:22 AM

## 2017-09-21 NOTE — Progress Notes (Signed)
[facility]   Patient admitted after midnight see H&P after midnight.   In brief, 56 y/o with hx of IV drug abuse and chronic spinal abscess of lower back. Patient had a previous rod fixation from T10 to [facility]. This is his 2nd admission due to spinal abscess. Last July he was admitted and was recommended [facility], although patient left AMA. Patient now return with back pain and febrile. This time hardware from previous surgery is expose thru the skin on the left side and there is some drainage from is back. Patient is neurologically intact. MRI of the back as below:  1. Thoracic to pelvic posterior fusion with chronic loosening of hardware at L4 and below. History of chronic spinal infection with stable collection superficial to the right L4-5 hardware at 17 x 4 mm. 2. Previously seen abscess about the left L5 and S1 hardware, deep to a lumbar sinus tract, has decompressed. 3. No evidence of spinal canal collection. Chronic changes of arachnoiditis. 4. Remote malunited sacrum fracture.  Case was discussed with neurosurgery whom recommended [facility], case to complicated.  For now will continue broad spectrum antibiotics and pain medication as needed. I [facility]. Awaiting for a call back.   Latrelle DodrillEdwin Silva, MD

## 2017-09-21 NOTE — ED Triage Notes (Signed)
Pt BIB [facility]. Pt picked up from [employer]. Pt is homeless.Pt is CAOX4. Pt is c/o an infection to an old surgical site. He has a surgical piece that has broken the skin and is surrounded by pus and redness and has a foul odor. 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 Pt was able to stand and transfer with EMS per his request.

## 2017-09-21 NOTE — ED Notes (Signed)
Bed assigned @ 0513. RM [phone] ED RN have been made aware

## 2017-09-21 NOTE — ED Notes (Signed)
Called floor about patient assignment. Patient is to be taken by day shift and report will have to be given after 0715 per the floor.

## 2017-09-21 NOTE — Progress Notes (Signed)
Initial Nutrition Assessment  DOCUMENTATION CODES:   Underweight(Will assess for malnutrition at follow-up)  INTERVENTION:  - Continue Ensure Enlive po BID, each supplement provides 350 kcal and 20 grams of protein - Will order daily multivitamin with minerals.  - Continue to encourage PO intakes, when fully awake. - RD will attempt to obtain information at follow-up and will also perform NFPE at that time.   NUTRITION DIAGNOSIS:   Increased nutrient needs related to wound healing as evidenced by estimated needs.  GOAL:   Patient will meet greater than or equal to 90% of their needs  MONITOR:   PO intake, Supplement acceptance, Weight trends, Labs, Skin  REASON FOR ASSESSMENT:   Malnutrition Screening Tool  ASSESSMENT:   56 y.o. male with history of drug abuse and previous history of paraspinal abscess of the low back who had been to our hospital in July with similar complaints and signed out AMA presents back with worsening pain and swelling and discharge from the low back area.  Patient has not had a well placed in the lumbar spine after trauma as per the chart in 2012.  Has had subsequent complications with infection and abscess.  Patient did not very forthcoming with history at this time.  Denies any weakness of the lower extremities or difficulty walking.  Has increasing pain and discharge.  Patient is homeless and admits to taking IV drugs.  BMI indicates underweight status. No intakes documented since admission. RN sitting outside of pt's room and requests RD not to enter pt's room at this time d/t agitation earlier and her wanting pt to be calm prior to going to MRI later this AM. No family/visitors noted to be at bedside. RN reports that pt has been in and out of sleep all morning and has not been capable of consuming breakfast/safely consuming breakfast d/t this.   Information from H&P outlined above. Per review of MST screen, pt reported being unsure of weight trends PTA.  Per chart review, pt has gained 9 lbs since 7/3 (date of back surgery). Current weight is -5 lbs (3.7% body weight) compared to weight on 3/6. This is not significant for time frame. Will continue to monitor weight trends closely, especially given current IVF rate.  Medications reviewed; 1 mg oral folic acid/day, 40 mEq oral KCl/day, 100 mg oral vs IV thiamine/day.  Labs reviewed; K: 3.3 mmol/L, Ca: 8.3 mg/dL. IVF: NS @ 125 mL/hr.     NUTRITION - FOCUSED PHYSICAL EXAM: Will perform/assess at follow-up.   Diet Order:  Diet Heart Room service appropriate? Yes; Fluid consistency: Thin  EDUCATION NEEDS:   No education needs have been identified at this time  Skin:  Skin Assessment: Skin Integrity Issues: Skin Integrity Issues:: Other (Comment) Other: Open L back incision from 7/3 with lower hardware protruding  Last BM:  11/8  Height:   Ht Readings from Last 1 Encounters:  09/21/17 6' (1.829 m)    Weight:   Wt Readings from Last 1 Encounters:  09/21/17 129 lb 3 oz (58.6 kg)    Ideal Body Weight:  80.91 kg  BMI:  Body mass index is 17.52 kg/m.  Estimated Nutritional Needs:   Kcal:  1935-2110 (33-36 kcal/kg)  Protein:  75-85 grams  Fluid:  >/= 2 L/day     Trenton GammonJessica [place], MS, RD, LDN, [facility] Inpatient Clinical Dietitian Pager # [phone] After hours/weekend pager # [phone]

## 2017-09-21 NOTE — Plan of Care (Signed)
Patient's condition was discussed with Dr. Bufford SpikesLippert. Hospitalist at [facility].  Patient agreeable to transfer.  Midge MiniumArshad Kakrakandy.

## 2017-09-22 DIAGNOSIS — T148XXA Other injury of unspecified body region, initial encounter: Secondary | ICD-10-CM

## 2017-09-22 DIAGNOSIS — L089 Local infection of the skin and subcutaneous tissue, unspecified: Secondary | ICD-10-CM

## 2017-09-22 LAB — BASIC METABOLIC PANEL
ANION GAP: 6 (ref 5–15)
BUN: 14 mg/dL (ref 6–20)
CHLORIDE: 105 mmol/L (ref 101–111)
CO2: 23 mmol/L (ref 22–32)
Calcium: 7.6 mg/dL — ABNORMAL LOW (ref 8.9–10.3)
Creatinine, Ser: 0.67 mg/dL (ref 0.61–1.24)
GFR calc Af Amer: >60 mL/min (ref >60)
GLUCOSE: 102 mg/dL — AB (ref 65–99)
POTASSIUM: 4 mmol/L (ref 3.5–5.1)
Sodium: 134 mmol/L — ABNORMAL LOW (ref 135–145)

## 2017-09-22 MED ORDER — KETOROLAC TROMETHAMINE 15 MG/ML IJ SOLN
15.0000 mg | Freq: Once | INTRAMUSCULAR | Status: AC
  Administered 2017-09-22: 15 mg via INTRAVENOUS
  Filled 2017-09-22: qty 1

## 2017-09-22 NOTE — Progress Notes (Signed)
[facility] protruding nail from a previous back surgery at this hospital. [facility] here to transport. Report given. Patient given toradol for pain prior to transport, per order.

## 2017-09-22 NOTE — Progress Notes (Signed)
Patient has been awaiting transport to [facility] all [facility].  Transport service called at 0500 to say he would either be last on this shift or first in line on day shift. Pt updated. Pt crying out in pain, have paged MD for one time dose of IV pain medication. Ethan [name] P [name]

## 2017-09-22 NOTE — Discharge Summary (Signed)
Physician Discharge Summary  Ethan MoodJackie R Meleski WUJ:[id number] DOB: 10/04/1961 DOA: 09/21/2017  PCP: Marletta LorBarr, Julie, NP  Admit date: 09/21/2017 Discharge date: 09/22/2017  Admitted From: Home Disposition:  Transfer to [facility], Dr. Bufford SpikesLippert hospitalist   Discharge Condition: Stable at time of transfer CODE STATUS: FULL  Diet recommendation: Regular  Brief/Interim Summary: Ethan [name] is a 56 y.o. male with history of drug abuse and previous history of paraspinal abscess of the low back who had been to our hospital in July with similar complaints and signed out AMA. He now presents with worsening pain and swelling and discharge from the low back area.  Patient has not had a well placed in the lumbar spine after trauma as per the chart in 2012.  Has had subsequent complications with infection and abscess.  Patient did not very forthcoming with history at time of admission  Denies any weakness of the lower extremities or difficulty walking.  Has increasing pain and discharge.  Patient is homeless and admits to taking IV drugs.  In the ER on exam patient has hardware protruding from his low back with some discharge.  Patient is tachycardic and febrile.  Blood cultures were obtained along with sed rate and started on empiric antibiotics.   MRI was obtained: 1. Thoracic to pelvic posterior fusion with chronic loosening of hardware at L4 and below. History of chronic spinal infection with stable collection superficial to the right L4-5 hardware at 17 x 4 mm. 2. Previously seen abscess about the left L5 and S1 hardware, deep to a lumbar sinus tract, has decompressed. 3. No evidence of spinal canal collection. Chronic changes of arachnoiditis. 4. Remote malunited sacrum fracture.  Case was discussed with neurosurgery who recommended transferring [facility] as this case is too complicated and patient's initial [facility] Dr. Bufford SpikesLippert (hospitalist) accepted transfer of  patient.  Discharge Diagnoses:  Principal Problem:   Wound infection Active Problems:   Anemia   Brain mass   Hypertension   Heroin use   1. Low back wound infection with history of paraspinal abscess and hardware from trauma - transfer to [facility]. Patient with exposed hardware and now MSSA positive bacteremia. Was empirically treated with vanco and zosyn at [facility] prior to blood culture result.  2. MSSA bacteremia - blood cultures 2 of 2 with MSSA. Will need ID consultation at [facility]  3. History of IV drug abuse - placed on as needed Ativan for now.  Social work consult. UDS positive for amphetamines, benzo, opiates, cocaine, THC  4. History of hypertension - presently on no medications.  Closely follow blood pressure trends. 5. Chronic anemia - follow CBC.  6. History of brain mass per the chart.   Discharge Instructions   Allergies as of 09/22/2017      Reactions   Motrin [ibuprofen] Nausea Only      Medication List    You have not been prescribed any medications.     Allergies  Allergen Reactions  . Motrin [Ibuprofen] Nausea Only     Procedures/Studies: Mr Lumbar Spine W Wo Contrast  Result Date: 09/21/2017 CLINICAL DATA:  Back pain for over 6 weeks. EXAM: MRI LUMBAR SPINE WITHOUT AND WITH CONTRAST TECHNIQUE: Multiplanar and multiecho pulse sequences of the lumbar spine were obtained without and with intravenous contrast. CONTRAST:  12mL MULTIHANCE GADOBENATE DIMEGLUMINE 529 MG/ML IV SOLN COMPARISON:  05/15/2017 FINDINGS: Segmentation:  Standard. Alignment:  Straightening without subluxation. Vertebrae: Remote sacral fracture with kyphotic deformity  and hypertrophic healing. There is spinal fixation from the thoracic spine to the S1 and bilateral ilium. Based on CT Apr 13, 202018 there is chronic lucency around bilateral L4, L5, S1, and right iliac screws. There is chronic rim enhancing fluid collection about the right L4-5 posterior rod and pedicle screw, 17 by 4 mm  and similar to prior when allowing for differences in slice selection. There is inflammatory enhancement around contralateral hardware extending to the skin surface without residual collection. No new collection is seen. No evidence of collection in the spinal canal. There is chronic bone cleft about the left S1 screw. Remote L1 and L2 superior endplate fracture. No acute fracture. No visible discitis. Conus medullaris: Obscured by hardware. The lower cauda equina is clumped in the midline, stable. Paraspinal and other soft tissues: Known large bladder calculus. Disc levels: Much of the upper and mid lumbar spinal canal is obscured by hardware. IMPRESSION: 1. Thoracic to pelvic posterior fusion with chronic loosening of hardware at L4 and below. History of chronic spinal infection with stable collection superficial to the right L4-5 hardware at 17 x 4 mm. 2. Previously seen abscess about the left L5 and S1 hardware, deep to a lumbar sinus tract, has decompressed. 3. No evidence of spinal canal collection. Chronic changes of arachnoiditis. 4. Remote malunited sacrum fracture. Electronically Signed   By: Marnee Spring M.D.   On: 09/21/2017 12:29   Dg Chest Port 1 View  Result Date: 09/21/2017 CLINICAL DATA:  Fever EXAM: PORTABLE CHEST 1 VIEW COMPARISON:  Apr 13, 202018 FINDINGS: Shallow inspiration. Heart size and pulmonary vascularity are normal for technique. No airspace disease or consolidation in the lungs. No blunting of costophrenic angles. No pneumothorax. Old left rib fractures. Postoperative changes in the thoracolumbar spine. Examination is limited due to patient rotation. IMPRESSION: Shallow inspiration.  No evidence of active pulmonary disease. Electronically Signed   By: Burman Nieves M.D.   On: 09/21/2017 04:09       Discharge Exam: Vitals:   09/21/17 2005 09/22/17 0602  BP: 120/66 (!) 156/92  Pulse: 85 71  Resp: 19 15  Temp: 99.4 F (37.4 C) 98.6 F (37 C)  SpO2: 94% 100%   Vitals:    09/21/17 0826 09/21/17 1200 09/21/17 2005 09/22/17 0602  BP:  103/64 120/66 (!) 156/92  Pulse:  81 85 71  Resp:  18 19 15   Temp:  99.1 F (37.3 C) 99.4 F (37.4 C) 98.6 F (37 C)  TempSrc:  Axillary Oral Oral  SpO2:  100% 94% 100%  Weight: 58.6 kg (129 lb 3 oz)     Height: 6' (1.829 m)       General: Pt is alert, awake, not in acute distress, slumped over in pain  Cardiovascular: RRR, S1/S2 +, no rubs, no gallops Respiratory: CTA bilaterally, no wheezing, no rhonchi Abdominal: Soft, NT, ND, bowel sounds + Extremities: no edema, no cyanosis, +Left leg s/p BKA Skin: exposed hardware lumbar spine, without surrounding erythema, no drainage noted     The results of significant diagnostics from this hospitalization (including imaging, microbiology, ancillary and laboratory) are listed below for reference.     Microbiology: Recent Results (from the past 240 hour(s))  Blood Culture (routine x 2)     Status: None (Preliminary result)   Collection Time: 09/21/17  3:11 AM  Result Value Ref Range Status   Specimen Description BLOOD BLOOD RIGHT FOREARM  Final   Special Requests   Final    BOTTLES DRAWN AEROBIC AND ANAEROBIC Blood  Culture adequate volume   Culture  Setup Time   Final    GRAM POSITIVE COCCI IN CLUSTERS IN BOTH AEROBIC AND ANAEROBIC BOTTLES CRITICAL VALUE NOTED.  VALUE IS CONSISTENT WITH PREVIOUSLY REPORTED AND CALLED VALUE. Performed at [facility], [address], [place], [place] [address]    Culture PENDING  Incomplete   Report Status PENDING  Incomplete  Blood Culture (routine x 2)     Status: None (Preliminary result)   Collection Time: 09/21/17  3:14 AM  Result Value Ref Range Status   Specimen Description BLOOD BLOOD LEFT FOREARM  Final   Special Requests   Final    BOTTLES DRAWN AEROBIC AND ANAEROBIC Blood Culture adequate volume   Culture  Setup Time   Final    GRAM POSITIVE COCCI IN CLUSTERS IN BOTH AEROBIC AND ANAEROBIC BOTTLES Organism ID to  follow CRITICAL RESULT CALLED TO, READ BACK BY AND VERIFIED WITHAzzie Glatter [facility] 2123 09/21/17 A BROWNING Performed at [facility], [address], [place], [place] [address]    Culture PENDING  Incomplete   Report Status PENDING  Incomplete  Blood Culture ID Panel (Reflexed)     Status: Abnormal   Collection Time: 09/21/17  3:14 AM  Result Value Ref Range Status   Enterococcus species NOT DETECTED NOT DETECTED Final   Listeria monocytogenes NOT DETECTED NOT DETECTED Final   Staphylococcus species DETECTED (A) NOT DETECTED Final    Comment: CRITICAL RESULT CALLED TO, READ BACK BY AND VERIFIED WITH: J LEGGE PHARMD 2123 09/21/17 A BROWNING    Staphylococcus aureus DETECTED (A) NOT DETECTED Final    Comment: Methicillin (oxacillin) susceptible Staphylococcus aureus (MSSA). Preferred therapy is anti staphylococcal beta lactam antibiotic (Cefazolin or Nafcillin), unless clinically contraindicated. CRITICAL RESULT CALLED TO, READ BACK BY AND VERIFIED WITH: J LEGGE PHARMD 2123 09/21/17 A BROWNING    Methicillin resistance NOT DETECTED NOT DETECTED Final   Streptococcus species NOT DETECTED NOT DETECTED Final   Streptococcus agalactiae NOT DETECTED NOT DETECTED Final   Streptococcus pneumoniae NOT DETECTED NOT DETECTED Final   Streptococcus pyogenes NOT DETECTED NOT DETECTED Final   Acinetobacter baumannii NOT DETECTED NOT DETECTED Final   Enterobacteriaceae species NOT DETECTED NOT DETECTED Final   Enterobacter cloacae complex NOT DETECTED NOT DETECTED Final   Escherichia coli NOT DETECTED NOT DETECTED Final   Klebsiella oxytoca NOT DETECTED NOT DETECTED Final   Klebsiella pneumoniae NOT DETECTED NOT DETECTED Final   Proteus species NOT DETECTED NOT DETECTED Final   Serratia marcescens NOT DETECTED NOT DETECTED Final   Haemophilus influenzae NOT DETECTED NOT DETECTED Final   Neisseria meningitidis NOT DETECTED NOT DETECTED Final   Pseudomonas aeruginosa NOT DETECTED NOT DETECTED Final    Candida albicans NOT DETECTED NOT DETECTED Final   Candida glabrata NOT DETECTED NOT DETECTED Final   Candida krusei NOT DETECTED NOT DETECTED Final   Candida parapsilosis NOT DETECTED NOT DETECTED Final   Candida tropicalis NOT DETECTED NOT DETECTED Final    Comment: Performed at [facility], [address], [place], [place] [address]  Wound or Superficial Culture     Status: None (Preliminary result)   Collection Time: 09/21/17  3:30 AM  Result Value Ref Range Status   Specimen Description WOUND BACK  Final   Special Requests Normal  Final   Gram Stain   Final    FEW WBC PRESENT,BOTH PMN AND MONONUCLEAR MODERATE GRAM POSITIVE COCCI IN PAIRS RARE GRAM VARIABLE ROD Performed at [facility], [address], [place], [place] [address]    Culture PENDING  Incomplete   Report Status PENDING  Incomplete     Labs: BNP (last 3 results) No results for input(s): BNP in the last 8760 hours. Basic Metabolic Panel: Recent Labs  Lab 09/21/17 0314 09/22/17 0624  NA 135 134*  K 3.3* 4.0  CL 102 105  CO2 25 23  GLUCOSE 134* 102*  BUN 19 14  CREATININE 0.69 0.67  CALCIUM 8.3* 7.6*   Liver Function Tests: Recent Labs  Lab 09/21/17 0314  AST 28  ALT 22  ALKPHOS 91  BILITOT 1.0  PROT 7.4  ALBUMIN 2.7*   No results for input(s): LIPASE, AMYLASE in the last 168 hours. No results for input(s): AMMONIA in the last 168 hours. CBC: Recent Labs  Lab 09/21/17 0314  WBC 10.1  NEUTROABS 8.5*  HGB 10.8*  HCT 33.4*  MCV 74.4*  PLT 187   Cardiac Enzymes: No results for input(s): CKTOTAL, CKMB, CKMBINDEX, TROPONINI in the last 168 hours. BNP: Invalid input(s): POCBNP CBG: No results for input(s): GLUCAP in the last 168 hours. D-Dimer No results for input(s): DDIMER in the last 72 hours. Hgb A1c No results for input(s): HGBA1C in the last 72 hours. Lipid Profile No results for input(s): CHOL, HDL, LDLCALC, TRIG, CHOLHDL, LDLDIRECT in the last 72 hours. Thyroid  function studies No results for input(s): TSH, T4TOTAL, T3FREE, THYROIDAB in the last 72 hours.  Invalid input(s): FREET3 Anemia work up No results for input(s): VITAMINB12, FOLATE, FERRITIN, TIBC, IRON, RETICCTPCT in the last 72 hours. Urinalysis    Component Value Date/Time   COLORURINE AMBER (A) 09/21/2017 1732   APPEARANCEUR CLOUDY (A) 09/21/2017 1732   LABSPEC 1.038 (H) 09/21/2017 1732   PHURINE 5.0 09/21/2017 1732   GLUCOSEU NEGATIVE 09/21/2017 1732   HGBUR NEGATIVE 09/21/2017 1732   BILIRUBINUR NEGATIVE 09/21/2017 1732   KETONESUR NEGATIVE 09/21/2017 1732   PROTEINUR 30 (A) 09/21/2017 1732   UROBILINOGEN 1.0 05/01/2015 0441   NITRITE NEGATIVE 09/21/2017 1732   LEUKOCYTESUR LARGE (A) 09/21/2017 1732   Sepsis Labs Invalid input(s): PROCALCITONIN,  WBC,  LACTICIDVEN Microbiology Recent Results (from the past 240 hour(s))  Blood Culture (routine x 2)     Status: None (Preliminary result)   Collection Time: 09/21/17  3:11 AM  Result Value Ref Range Status   Specimen Description BLOOD BLOOD RIGHT FOREARM  Final   Special Requests   Final    BOTTLES DRAWN AEROBIC AND ANAEROBIC Blood Culture adequate volume   Culture  Setup Time   Final    GRAM POSITIVE COCCI IN CLUSTERS IN BOTH AEROBIC AND ANAEROBIC BOTTLES CRITICAL VALUE NOTED.  VALUE IS CONSISTENT WITH PREVIOUSLY REPORTED AND CALLED VALUE. Performed at [facility], [address], [place], [place] [address]    Culture PENDING  Incomplete   Report Status PENDING  Incomplete  Blood Culture (routine x 2)     Status: None (Preliminary result)   Collection Time: 09/21/17  3:14 AM  Result Value Ref Range Status   Specimen Description BLOOD BLOOD LEFT FOREARM  Final   Special Requests   Final    BOTTLES DRAWN AEROBIC AND ANAEROBIC Blood Culture adequate volume   Culture  Setup Time   Final    GRAM POSITIVE COCCI IN CLUSTERS IN BOTH AEROBIC AND ANAEROBIC BOTTLES Organism ID to follow CRITICAL RESULT CALLED TO, READ  BACK BY AND VERIFIED WITHAzzie Glatter [facility] 2123 09/21/17 A BROWNING Performed  at [facility], [address] [place], [place] [address]    Culture PENDING  Incomplete   Report Status PENDING  Incomplete  Blood Culture ID Panel (Reflexed)     Status: Abnormal   Collection Time: 09/21/17  3:14 AM  Result Value Ref Range Status   Enterococcus species NOT DETECTED NOT DETECTED Final   Listeria monocytogenes NOT DETECTED NOT DETECTED Final   Staphylococcus species DETECTED (A) NOT DETECTED Final    Comment: CRITICAL RESULT CALLED TO, READ BACK BY AND VERIFIED WITH: J LEGGE PHARMD 2123 09/21/17 A BROWNING    Staphylococcus aureus DETECTED (A) NOT DETECTED Final    Comment: Methicillin (oxacillin) susceptible Staphylococcus aureus (MSSA). Preferred therapy is anti staphylococcal beta lactam antibiotic (Cefazolin or Nafcillin), unless clinically contraindicated. CRITICAL RESULT CALLED TO, READ BACK BY AND VERIFIED WITH: J LEGGE PHARMD 2123 09/21/17 A BROWNING    Methicillin resistance NOT DETECTED NOT DETECTED Final   Streptococcus species NOT DETECTED NOT DETECTED Final   Streptococcus agalactiae NOT DETECTED NOT DETECTED Final   Streptococcus pneumoniae NOT DETECTED NOT DETECTED Final   Streptococcus pyogenes NOT DETECTED NOT DETECTED Final   Acinetobacter baumannii NOT DETECTED NOT DETECTED Final   Enterobacteriaceae species NOT DETECTED NOT DETECTED Final   Enterobacter cloacae complex NOT DETECTED NOT DETECTED Final   Escherichia coli NOT DETECTED NOT DETECTED Final   Klebsiella oxytoca NOT DETECTED NOT DETECTED Final   Klebsiella pneumoniae NOT DETECTED NOT DETECTED Final   Proteus species NOT DETECTED NOT DETECTED Final   Serratia marcescens NOT DETECTED NOT DETECTED Final   Haemophilus influenzae NOT DETECTED NOT DETECTED Final   Neisseria meningitidis NOT DETECTED NOT DETECTED Final   Pseudomonas aeruginosa NOT DETECTED NOT DETECTED Final   Candida albicans NOT DETECTED NOT  DETECTED Final   Candida glabrata NOT DETECTED NOT DETECTED Final   Candida krusei NOT DETECTED NOT DETECTED Final   Candida parapsilosis NOT DETECTED NOT DETECTED Final   Candida tropicalis NOT DETECTED NOT DETECTED Final    Comment: Performed at [facility], [address] [place], [place] [address]  Wound or Superficial Culture     Status: None (Preliminary result)   Collection Time: 09/21/17  3:30 AM  Result Value Ref Range Status   Specimen Description WOUND BACK  Final   Special Requests Normal  Final   Gram Stain   Final    FEW WBC PRESENT,BOTH PMN AND MONONUCLEAR MODERATE GRAM POSITIVE COCCI IN PAIRS RARE GRAM VARIABLE ROD Performed at [facility], [address] [place], [place] [address]    Culture PENDING  Incomplete   Report Status PENDING  Incomplete     Time coordinating discharge: 40 minutes  SIGNED:  Noralee StainJennifer Yareth Macdonnell, DO [facility] Pager [phone]  If 7PM-7AM, please contact night-coverage [email or web address] Password [email or web address] 09/22/2017, 8:23 AM

## 2017-09-23 LAB — AEROBIC CULTURE  (SUPERFICIAL SPECIMEN): SPECIAL REQUESTS: NORMAL

## 2017-09-23 LAB — AEROBIC CULTURE W GRAM STAIN (SUPERFICIAL SPECIMEN)

## 2017-09-23 LAB — CULTURE, BLOOD (ROUTINE X 2)
SPECIAL REQUESTS: ADEQUATE
SPECIAL REQUESTS: ADEQUATE

## 2018-01-21 ENCOUNTER — Telehealth (INDEPENDENT_AMBULATORY_CARE_PROVIDER_SITE_OTHER): Payer: Self-pay | Admitting: Orthopedic Surgery

## 2018-01-21 NOTE — Telephone Encounter (Signed)
Can you send records for this pt?

## 2018-01-21 NOTE — Telephone Encounter (Signed)
Patient is requesting surgical notes from left below knee amputation on [id number], he had prosthesis made then and has gained weight since so he needs a new prosthesis. The company making it for him needs these notes. Patient is living at [facility] [place] and would like them faxed to # [phone].

## 2018-01-24 NOTE — Telephone Encounter (Signed)
IC, lmvm asking patient to call me back

## 2018-04-01 ENCOUNTER — Inpatient Hospital Stay (HOSPITAL_COMMUNITY): Payer: Medicaid Other

## 2018-04-01 ENCOUNTER — Encounter (HOSPITAL_COMMUNITY): Admission: EM | Disposition: A | Discharge status: Skilled Nursing Facility | Payer: Self-pay | Source: Home / Self Care

## 2018-04-01 ENCOUNTER — Emergency Department (HOSPITAL_COMMUNITY): Payer: Medicaid Other

## 2018-04-01 ENCOUNTER — Encounter (HOSPITAL_COMMUNITY): Payer: Self-pay | Admitting: Emergency Medicine

## 2018-04-01 ENCOUNTER — Inpatient Hospital Stay (HOSPITAL_COMMUNITY): Payer: Medicaid Other | Admitting: Anesthesiology

## 2018-04-01 ENCOUNTER — Other Ambulatory Visit: Payer: Self-pay

## 2018-04-01 ENCOUNTER — Inpatient Hospital Stay (HOSPITAL_COMMUNITY)
Admission: EM | Admit: 2018-04-01 | Discharge: 2018-04-05 | DRG: 482 | Disposition: A | Discharge status: Skilled Nursing Facility | Payer: Medicaid Other | Attending: General Surgery | Admitting: General Surgery

## 2018-04-01 DIAGNOSIS — S50312A Abrasion of left elbow, initial encounter: Secondary | ICD-10-CM | POA: Diagnosis present

## 2018-04-01 DIAGNOSIS — R682 Dry mouth, unspecified: Secondary | ICD-10-CM | POA: Diagnosis not present

## 2018-04-01 DIAGNOSIS — F17210 Nicotine dependence, cigarettes, uncomplicated: Secondary | ICD-10-CM | POA: Diagnosis present

## 2018-04-01 DIAGNOSIS — Y92410 Unspecified street and highway as the place of occurrence of the external cause: Secondary | ICD-10-CM | POA: Diagnosis not present

## 2018-04-01 DIAGNOSIS — Z981 Arthrodesis status: Secondary | ICD-10-CM

## 2018-04-01 DIAGNOSIS — Z23 Encounter for immunization: Secondary | ICD-10-CM

## 2018-04-01 DIAGNOSIS — Z89512 Acquired absence of left leg below knee: Secondary | ICD-10-CM

## 2018-04-01 DIAGNOSIS — Z590 Homelessness: Secondary | ICD-10-CM | POA: Diagnosis not present

## 2018-04-01 DIAGNOSIS — S72142A Displaced intertrochanteric fracture of left femur, initial encounter for closed fracture: Secondary | ICD-10-CM

## 2018-04-01 DIAGNOSIS — T148XXA Other injury of unspecified body region, initial encounter: Secondary | ICD-10-CM

## 2018-04-01 DIAGNOSIS — G8929 Other chronic pain: Secondary | ICD-10-CM | POA: Diagnosis present

## 2018-04-01 DIAGNOSIS — D649 Anemia, unspecified: Secondary | ICD-10-CM | POA: Diagnosis present

## 2018-04-01 DIAGNOSIS — F419 Anxiety disorder, unspecified: Secondary | ICD-10-CM | POA: Diagnosis present

## 2018-04-01 DIAGNOSIS — I10 Essential (primary) hypertension: Secondary | ICD-10-CM | POA: Diagnosis present

## 2018-04-01 DIAGNOSIS — Z8619 Personal history of other infectious and parasitic diseases: Secondary | ICD-10-CM | POA: Diagnosis not present

## 2018-04-01 DIAGNOSIS — S82301A Unspecified fracture of lower end of right tibia, initial encounter for closed fracture: Secondary | ICD-10-CM

## 2018-04-01 DIAGNOSIS — S82201A Unspecified fracture of shaft of right tibia, initial encounter for closed fracture: Secondary | ICD-10-CM | POA: Diagnosis present

## 2018-04-01 DIAGNOSIS — Z8249 Family history of ischemic heart disease and other diseases of the circulatory system: Secondary | ICD-10-CM | POA: Diagnosis not present

## 2018-04-01 DIAGNOSIS — M545 Low back pain: Secondary | ICD-10-CM | POA: Diagnosis present

## 2018-04-01 DIAGNOSIS — R339 Retention of urine, unspecified: Secondary | ICD-10-CM | POA: Diagnosis present

## 2018-04-01 DIAGNOSIS — S8254XA Nondisplaced fracture of medial malleolus of right tibia, initial encounter for closed fracture: Secondary | ICD-10-CM | POA: Diagnosis present

## 2018-04-01 DIAGNOSIS — Z888 Allergy status to other drugs, medicaments and biological substances status: Secondary | ICD-10-CM | POA: Diagnosis not present

## 2018-04-01 DIAGNOSIS — E876 Hypokalemia: Secondary | ICD-10-CM | POA: Diagnosis present

## 2018-04-01 DIAGNOSIS — M62838 Other muscle spasm: Secondary | ICD-10-CM | POA: Diagnosis present

## 2018-04-01 DIAGNOSIS — Z419 Encounter for procedure for purposes other than remedying health state, unspecified: Secondary | ICD-10-CM

## 2018-04-01 DIAGNOSIS — W050XXA Fall from non-moving wheelchair, initial encounter: Secondary | ICD-10-CM

## 2018-04-01 DIAGNOSIS — Z993 Dependence on wheelchair: Secondary | ICD-10-CM

## 2018-04-01 DIAGNOSIS — R062 Wheezing: Secondary | ICD-10-CM

## 2018-04-01 HISTORY — PX: FEMUR IM NAIL: SHX1597

## 2018-04-01 LAB — URINALYSIS, ROUTINE W REFLEX MICROSCOPIC
BILIRUBIN URINE: NEGATIVE
Glucose, UA: NEGATIVE mg/dL
HGB URINE DIPSTICK: NEGATIVE
Ketones, ur: 5 mg/dL — AB
LEUKOCYTES UA: NEGATIVE
NITRITE: NEGATIVE
PH: 5 (ref 5.0–8.0)
PROTEIN: NEGATIVE mg/dL
Specific Gravity, Urine: >1.046 — ABNORMAL HIGH (ref 1.005–1.030)

## 2018-04-01 LAB — CBC
HEMATOCRIT: 38.5 % — AB (ref 39.0–52.0)
Hemoglobin: 13 g/dL (ref 13.0–17.0)
MCH: 28.4 pg (ref 26.0–34.0)
MCHC: 33.8 g/dL (ref 30.0–36.0)
MCV: 84.1 fL (ref 78.0–100.0)
Platelets: 208 10*3/uL (ref 150–400)
RBC: 4.58 MIL/uL (ref 4.22–5.81)
RDW: 15.5 % (ref 11.5–15.5)
WBC: 7.3 10*3/uL (ref 4.0–10.5)

## 2018-04-01 LAB — COMPREHENSIVE METABOLIC PANEL
ALT: 35 U/L (ref 17–63)
ANION GAP: 13 (ref 5–15)
AST: 62 U/L — ABNORMAL HIGH (ref 15–41)
Albumin: 3.9 g/dL (ref 3.5–5.0)
Alkaline Phosphatase: 105 U/L (ref 38–126)
BUN: 31 mg/dL — ABNORMAL HIGH (ref 6–20)
CHLORIDE: 99 mmol/L — AB (ref 101–111)
CO2: 24 mmol/L (ref 22–32)
Calcium: 9 mg/dL (ref 8.9–10.3)
Creatinine, Ser: 1.01 mg/dL (ref 0.61–1.24)
Glucose, Bld: 127 mg/dL — ABNORMAL HIGH (ref 65–99)
Potassium: 3.2 mmol/L — ABNORMAL LOW (ref 3.5–5.1)
SODIUM: 136 mmol/L (ref 135–145)
Total Bilirubin: 2 mg/dL — ABNORMAL HIGH (ref 0.3–1.2)
Total Protein: 7.5 g/dL (ref 6.5–8.1)

## 2018-04-01 LAB — I-STAT CHEM 8, ED
BUN: 30 mg/dL — ABNORMAL HIGH (ref 6–20)
CALCIUM ION: 1.12 mmol/L — AB (ref 1.15–1.40)
Chloride: 102 mmol/L (ref 101–111)
Creatinine, Ser: 0.9 mg/dL (ref 0.61–1.24)
GLUCOSE: 121 mg/dL — AB (ref 65–99)
HCT: 37 % — ABNORMAL LOW (ref 39.0–52.0)
HEMOGLOBIN: 12.6 g/dL — AB (ref 13.0–17.0)
POTASSIUM: 3.2 mmol/L — AB (ref 3.5–5.1)
Sodium: 139 mmol/L (ref 135–145)
TCO2: 26 mmol/L (ref 22–32)

## 2018-04-01 LAB — I-STAT CG4 LACTIC ACID, ED: LACTIC ACID, VENOUS: 1.61 mmol/L (ref 0.5–1.9)

## 2018-04-01 LAB — PROTIME-INR
INR: 1.16
Prothrombin Time: 14.7 seconds (ref 11.4–15.2)

## 2018-04-01 LAB — SAMPLE TO BLOOD BANK

## 2018-04-01 LAB — ETHANOL: Alcohol, Ethyl (B): <10 mg/dL (ref <10)

## 2018-04-01 LAB — CDS SEROLOGY

## 2018-04-01 SURGERY — INSERTION, INTRAMEDULLARY ROD, FEMUR
Anesthesia: General | Site: Leg Upper | Laterality: Left

## 2018-04-01 MED ORDER — HYDROMORPHONE HCL 2 MG/ML IJ SOLN
0.5000 mg | INTRAMUSCULAR | Status: AC | PRN
  Administered 2018-04-01 (×2): 0.5 mg via INTRAVENOUS

## 2018-04-01 MED ORDER — CHLORHEXIDINE GLUCONATE 4 % EX LIQD: 60.0000 mL | Freq: Once | CUTANEOUS | Status: DC

## 2018-04-01 MED ORDER — HYDROMORPHONE HCL 2 MG/ML IJ SOLN
1.0000 mg | INTRAMUSCULAR | Status: DC | PRN
  Administered 2018-04-02 (×4): 1 mg via INTRAVENOUS
  Filled 2018-04-01 (×3): qty 1

## 2018-04-01 MED ORDER — HYDROMORPHONE HCL 2 MG/ML IJ SOLN
INTRAMUSCULAR | Status: AC
  Administered 2018-04-01: 0.5 mg via INTRAVENOUS
  Filled 2018-04-01: qty 1

## 2018-04-01 MED ORDER — ONDANSETRON HCL 4 MG/2ML IJ SOLN
4.0000 mg | Freq: Four times a day (QID) | INTRAMUSCULAR | Status: DC | PRN
Start: 2018-04-01 — End: 2018-04-05
  Administered 2018-04-01 (×2): 4 mg via INTRAVENOUS
  Filled 2018-04-01 (×2): qty 2

## 2018-04-01 MED ORDER — FENTANYL CITRATE (PF) 250 MCG/5ML IJ SOLN
INTRAMUSCULAR | Status: AC
  Filled 2018-04-01: qty 5

## 2018-04-01 MED ORDER — CEFAZOLIN SODIUM-DEXTROSE 2-4 GM/100ML-% IV SOLN
2.0000 g | Freq: Three times a day (TID) | INTRAVENOUS | Status: AC
  Administered 2018-04-02 (×2): 2 g via INTRAVENOUS
  Filled 2018-04-01 (×3): qty 100

## 2018-04-01 MED ORDER — ONDANSETRON HCL 4 MG/2ML IJ SOLN
INTRAMUSCULAR | Status: DC | PRN
  Administered 2018-04-01: 4 mg via INTRAVENOUS

## 2018-04-01 MED ORDER — MORPHINE SULFATE (PF) 4 MG/ML IV SOLN
2.0000 mg | INTRAVENOUS | Status: DC | PRN
  Administered 2018-04-01 (×3): 4 mg via INTRAVENOUS
  Filled 2018-04-01 (×3): qty 1

## 2018-04-01 MED ORDER — HYDRALAZINE HCL 20 MG/ML IJ SOLN: 10.0000 mg | INTRAMUSCULAR | Status: DC | PRN

## 2018-04-01 MED ORDER — LIDOCAINE HCL (CARDIAC) PF 100 MG/5ML IV SOSY
PREFILLED_SYRINGE | INTRAVENOUS | Status: DC | PRN
  Administered 2018-04-01: 30 mg via INTRAVENOUS

## 2018-04-01 MED ORDER — TETANUS-DIPHTH-ACELL PERTUSSIS 5-2.5-18.5 LF-MCG/0.5 IM SUSP
0.5000 mL | Freq: Once | INTRAMUSCULAR | Status: AC
  Administered 2018-04-01: 0.5 mL via INTRAMUSCULAR
  Filled 2018-04-01: qty 0.5

## 2018-04-01 MED ORDER — HYDROMORPHONE HCL 2 MG/ML IJ SOLN
0.2500 mg | INTRAMUSCULAR | Status: DC | PRN
  Administered 2018-04-01 (×4): 0.5 mg via INTRAVENOUS

## 2018-04-01 MED ORDER — CEFAZOLIN SODIUM-DEXTROSE 2-4 GM/100ML-% IV SOLN
2.0000 g | INTRAVENOUS | Status: AC
  Administered 2018-04-01: 2 g via INTRAVENOUS
  Filled 2018-04-01: qty 100

## 2018-04-01 MED ORDER — MIDAZOLAM HCL 5 MG/5ML IJ SOLN
INTRAMUSCULAR | Status: DC | PRN
  Administered 2018-04-01: 2 mg via INTRAVENOUS

## 2018-04-01 MED ORDER — PHENYLEPHRINE 40 MCG/ML (10ML) SYRINGE FOR IV PUSH (FOR BLOOD PRESSURE SUPPORT)
PREFILLED_SYRINGE | INTRAVENOUS | Status: AC
  Filled 2018-04-01: qty 20

## 2018-04-01 MED ORDER — GABAPENTIN 300 MG PO CAPS
600.0000 mg | ORAL_CAPSULE | Freq: Two times a day (BID) | ORAL | Status: DC
  Administered 2018-04-01 – 2018-04-05 (×8): 600 mg via ORAL
  Filled 2018-04-01 (×9): qty 2

## 2018-04-01 MED ORDER — NICOTINE 21 MG/24HR TD PT24
21.0000 mg | MEDICATED_PATCH | Freq: Once | TRANSDERMAL | Status: DC
  Administered 2018-04-01: 21 mg via TRANSDERMAL
  Filled 2018-04-01: qty 1

## 2018-04-01 MED ORDER — MIDAZOLAM HCL 2 MG/2ML IJ SOLN
1.0000 mg | Freq: Once | INTRAMUSCULAR | Status: AC
  Administered 2018-04-01: 1 mg via INTRAVENOUS

## 2018-04-01 MED ORDER — ONDANSETRON HCL 4 MG/2ML IJ SOLN
INTRAMUSCULAR | Status: AC
Start: 2018-04-01 — End: ?
  Filled 2018-04-01: qty 2

## 2018-04-01 MED ORDER — 0.9 % SODIUM CHLORIDE (POUR BTL) OPTIME
TOPICAL | Status: DC | PRN
  Administered 2018-04-01: 1000 mL

## 2018-04-01 MED ORDER — ACETAMINOPHEN 325 MG PO TABS
650.0000 mg | ORAL_TABLET | ORAL | Status: DC
  Administered 2018-04-01 – 2018-04-05 (×20): 650 mg via ORAL
  Filled 2018-04-01 (×21): qty 2

## 2018-04-01 MED ORDER — MIDAZOLAM HCL 2 MG/2ML IJ SOLN
INTRAMUSCULAR | Status: AC
  Administered 2018-04-01: 1 mg via INTRAVENOUS
  Filled 2018-04-01: qty 2

## 2018-04-01 MED ORDER — PROMETHAZINE HCL 25 MG/ML IJ SOLN
INTRAMUSCULAR | Status: AC
  Filled 2018-04-01: qty 1

## 2018-04-01 MED ORDER — MIDAZOLAM HCL 2 MG/2ML IJ SOLN
INTRAMUSCULAR | Status: AC
  Filled 2018-04-01: qty 2

## 2018-04-01 MED ORDER — LACTATED RINGERS IV SOLN
INTRAVENOUS | Status: DC | PRN
  Administered 2018-04-01: 17:00:00 via INTRAVENOUS

## 2018-04-01 MED ORDER — OXYCODONE HCL 5 MG PO TABS
5.0000 mg | ORAL_TABLET | ORAL | Status: DC | PRN
  Administered 2018-04-01: 10 mg via ORAL

## 2018-04-01 MED ORDER — ROCURONIUM BROMIDE 100 MG/10ML IV SOLN
INTRAVENOUS | Status: DC | PRN
  Administered 2018-04-01: 50 mg via INTRAVENOUS
  Administered 2018-04-01: 10 mg via INTRAVENOUS

## 2018-04-01 MED ORDER — FENTANYL CITRATE (PF) 100 MCG/2ML IJ SOLN
INTRAMUSCULAR | Status: DC | PRN
  Administered 2018-04-01: 150 ug via INTRAVENOUS
  Administered 2018-04-01: 100 ug via INTRAVENOUS

## 2018-04-01 MED ORDER — PROPOFOL 10 MG/ML IV BOLUS
INTRAVENOUS | Status: AC
  Filled 2018-04-01: qty 20

## 2018-04-01 MED ORDER — OXYCODONE HCL 5 MG PO TABS
5.0000 mg | ORAL_TABLET | ORAL | Status: DC | PRN
Start: 2018-04-01 — End: 2018-04-05
  Administered 2018-04-02: 15 mg via ORAL
  Administered 2018-04-02: 10 mg via ORAL
  Administered 2018-04-02 – 2018-04-04 (×10): 15 mg via ORAL
  Administered 2018-04-04 (×2): 10 mg via ORAL
  Administered 2018-04-05 (×2): 15 mg via ORAL
  Filled 2018-04-01 (×6): qty 3
  Filled 2018-04-01: qty 2
  Filled 2018-04-01: qty 3
  Filled 2018-04-01: qty 2
  Filled 2018-04-01: qty 3
  Filled 2018-04-01: qty 2
  Filled 2018-04-01 (×6): qty 3

## 2018-04-01 MED ORDER — POTASSIUM CHLORIDE IN NACL 40-0.9 MEQ/L-% IV SOLN
INTRAVENOUS | Status: DC
  Administered 2018-04-01 – 2018-04-02 (×2): 125 mL/h via INTRAVENOUS
  Filled 2018-04-01 (×4): qty 1000

## 2018-04-01 MED ORDER — DOCUSATE SODIUM 100 MG PO CAPS
100.0000 mg | ORAL_CAPSULE | Freq: Two times a day (BID) | ORAL | Status: DC
  Administered 2018-04-01 – 2018-04-04 (×5): 100 mg via ORAL
  Filled 2018-04-01 (×8): qty 1

## 2018-04-01 MED ORDER — FENTANYL CITRATE (PF) 250 MCG/5ML IJ SOLN
INTRAMUSCULAR | Status: AC
Start: 2018-04-01 — End: ?
  Filled 2018-04-01: qty 5

## 2018-04-01 MED ORDER — ROCURONIUM BROMIDE 10 MG/ML (PF) SYRINGE
PREFILLED_SYRINGE | INTRAVENOUS | Status: AC
  Filled 2018-04-01: qty 10

## 2018-04-01 MED ORDER — SUGAMMADEX SODIUM 200 MG/2ML IV SOLN
INTRAVENOUS | Status: AC
Start: 2018-04-01 — End: ?
  Filled 2018-04-01: qty 2

## 2018-04-01 MED ORDER — PROMETHAZINE HCL 25 MG/ML IJ SOLN
6.2500 mg | INTRAMUSCULAR | Status: DC | PRN
  Administered 2018-04-01: 6.25 mg via INTRAVENOUS

## 2018-04-01 MED ORDER — LIDOCAINE 2% (20 MG/ML) 5 ML SYRINGE
INTRAMUSCULAR | Status: AC
  Filled 2018-04-01: qty 5

## 2018-04-01 MED ORDER — PANTOPRAZOLE SODIUM 40 MG PO TBEC
40.0000 mg | DELAYED_RELEASE_TABLET | Freq: Every day | ORAL | Status: DC
  Administered 2018-04-02 – 2018-04-05 (×4): 40 mg via ORAL
  Filled 2018-04-01 (×4): qty 1

## 2018-04-01 MED ORDER — POVIDONE-IODINE 10 % EX SWAB: 2.0000 "application " | Freq: Once | CUTANEOUS | Status: DC

## 2018-04-01 MED ORDER — IOHEXOL 300 MG/ML  SOLN
100.0000 mL | Freq: Once | INTRAMUSCULAR | Status: AC | PRN
  Administered 2018-04-01: 100 mL via INTRAVENOUS

## 2018-04-01 MED ORDER — OXYCODONE HCL 5 MG PO TABS
ORAL_TABLET | ORAL | Status: AC
  Administered 2018-04-01: 10 mg via ORAL
  Filled 2018-04-01: qty 2

## 2018-04-01 MED ORDER — FENTANYL CITRATE (PF) 100 MCG/2ML IJ SOLN
50.0000 ug | Freq: Once | INTRAMUSCULAR | Status: AC
  Administered 2018-04-01: 50 ug via INTRAVENOUS
  Filled 2018-04-01: qty 2

## 2018-04-01 MED ORDER — ONDANSETRON 4 MG PO TBDP
4.0000 mg | ORAL_TABLET | Freq: Four times a day (QID) | ORAL | Status: DC | PRN
Start: 2018-04-01 — End: 2018-04-05
  Filled 2018-04-01: qty 1

## 2018-04-01 MED ORDER — METHOCARBAMOL 500 MG PO TABS
500.0000 mg | ORAL_TABLET | Freq: Four times a day (QID) | ORAL | Status: DC | PRN
  Administered 2018-04-01 – 2018-04-02 (×2): 1000 mg via ORAL
  Filled 2018-04-01 (×2): qty 2

## 2018-04-01 MED ORDER — PROPOFOL 10 MG/ML IV BOLUS
INTRAVENOUS | Status: DC | PRN
  Administered 2018-04-01: 160 mg via INTRAVENOUS

## 2018-04-01 MED ORDER — SUGAMMADEX SODIUM 200 MG/2ML IV SOLN
INTRAVENOUS | Status: DC | PRN
  Administered 2018-04-01: 117 mg via INTRAVENOUS

## 2018-04-01 MED ORDER — LACTATED RINGERS IV SOLN: INTRAVENOUS | Status: DC

## 2018-04-01 SURGICAL SUPPLY — 58 items
ADH SKN CLS APL DERMABOND .7 (GAUZE/BANDAGES/DRESSINGS) ×1
BANDAGE ACE 4X5 VEL STRL LF (GAUZE/BANDAGES/DRESSINGS) ×2 IMPLANT
BANDAGE ACE 6X5 VEL STRL LF (GAUZE/BANDAGES/DRESSINGS) ×2 IMPLANT
BIT DRILL FLUTED FEMUR 4.2/3 (BIT) ×2 IMPLANT
BLADE TFNA HELICAL 100 STRL (Orthopedic Implant) ×2 IMPLANT
BNDG COHESIVE 6X5 TAN STRL LF (GAUZE/BANDAGES/DRESSINGS) ×2 IMPLANT
BRUSH SCRUB SURG 4.25 DISP (MISCELLANEOUS) ×4 IMPLANT
COVER PERINEAL POST (MISCELLANEOUS) ×3 IMPLANT
COVER SURGICAL LIGHT HANDLE (MISCELLANEOUS) ×4 IMPLANT
DERMABOND ADVANCED (GAUZE/BANDAGES/DRESSINGS) ×2
DERMABOND ADVANCED .7 DNX12 (GAUZE/BANDAGES/DRESSINGS) IMPLANT
DRAPE C-ARMOR (DRAPES) ×1 IMPLANT
DRAPE HALF SHEET 40X57 (DRAPES) IMPLANT
DRAPE ORTHO SPLIT 77X108 STRL (DRAPES)
DRAPE SURG ORHT 6 SPLT 77X108 (DRAPES) ×2 IMPLANT
DRAPE U-SHAPE 47X51 STRL (DRAPES) ×1 IMPLANT
DRSG MEPILEX BORDER 4X4 (GAUZE/BANDAGES/DRESSINGS) ×5 IMPLANT
DRSG MEPILEX BORDER 4X8 (GAUZE/BANDAGES/DRESSINGS) ×1 IMPLANT
ELECT REM PT RETURN 9FT ADLT (ELECTROSURGICAL) ×3
ELECTRODE REM PT RTRN 9FT ADLT (ELECTROSURGICAL) ×1 IMPLANT
GLOVE BIO SURGEON STRL SZ7.5 (GLOVE) ×3 IMPLANT
GLOVE BIO SURGEON STRL SZ8 (GLOVE) ×1 IMPLANT
GLOVE BIOGEL PI IND STRL 7.5 (GLOVE) ×1 IMPLANT
GLOVE BIOGEL PI IND STRL 8 (GLOVE) ×1 IMPLANT
GLOVE BIOGEL PI INDICATOR 7.5 (GLOVE)
GLOVE BIOGEL PI INDICATOR 8 (GLOVE) ×2
GOWN STRL REUS W/ TWL LRG LVL3 (GOWN DISPOSABLE) ×2 IMPLANT
GOWN STRL REUS W/ TWL XL LVL3 (GOWN DISPOSABLE) ×1 IMPLANT
GOWN STRL REUS W/TWL LRG LVL3 (GOWN DISPOSABLE) ×6
GOWN STRL REUS W/TWL XL LVL3 (GOWN DISPOSABLE) ×3
GUIDEWIRE 3.2X400 (WIRE) ×2 IMPLANT
KIT BASIN OR (CUSTOM PROCEDURE TRAY) ×3 IMPLANT
KIT TURNOVER KIT B (KITS) ×3 IMPLANT
MANIFOLD NEPTUNE II (INSTRUMENTS) ×1 IMPLANT
NAIL TROCH FIX 10X170 130 (Nail) ×2 IMPLANT
NS IRRIG 1000ML POUR BTL (IV SOLUTION) ×3 IMPLANT
PACK GENERAL/GYN (CUSTOM PROCEDURE TRAY) ×3 IMPLANT
PAD ARMBOARD 7.5X6 YLW CONV (MISCELLANEOUS) ×6 IMPLANT
PAD CAST 4YDX4 CTTN HI CHSV (CAST SUPPLIES) IMPLANT
PADDING CAST COTTON 4X4 STRL (CAST SUPPLIES) ×6
PADDING CAST SYN 6 (CAST SUPPLIES) ×2
PADDING CAST SYNTHETIC 6X4 NS (CAST SUPPLIES) IMPLANT
SCREW LOCKING 5.0X34MM (Screw) ×2 IMPLANT
SPLINT FIBERGLASS 3X35 (CAST SUPPLIES) ×2 IMPLANT
SPLINT FIBERGLASS 4X30 (CAST SUPPLIES) ×2 IMPLANT
STAPLER VISISTAT 35W (STAPLE) ×1 IMPLANT
STOCKINETTE IMPERVIOUS LG (DRAPES) IMPLANT
SUT ETHILON 3 0 PS 1 (SUTURE) ×1 IMPLANT
SUT MNCRL AB 3-0 PS2 18 (SUTURE) ×2 IMPLANT
SUT VIC AB 0 CT1 27 (SUTURE) ×3
SUT VIC AB 0 CT1 27XBRD ANBCTR (SUTURE) ×1 IMPLANT
SUT VIC AB 1 CT1 27 (SUTURE)
SUT VIC AB 1 CT1 27XBRD ANBCTR (SUTURE) ×1 IMPLANT
SUT VIC AB 2-0 CT1 27 (SUTURE) ×3
SUT VIC AB 2-0 CT1 TAPERPNT 27 (SUTURE) ×1 IMPLANT
TOWEL OR 17X24 6PK STRL BLUE (TOWEL DISPOSABLE) ×1 IMPLANT
TOWEL OR 17X26 10 PK STRL BLUE (TOWEL DISPOSABLE) ×6 IMPLANT
WATER STERILE IRR 1000ML POUR (IV SOLUTION) ×1 IMPLANT

## 2018-04-01 NOTE — ED Notes (Signed)
Dr. Rosalia Hammers at bedside spoke extensively with patient about the importance of getting  The xrays to be able to provide the most appropriate treatment Patient still refuses states he wants to go to [facility]

## 2018-04-01 NOTE — Anesthesia Postprocedure Evaluation (Signed)
Anesthesia Post Note  Patient: Ethan [name]  Procedure(s) Performed: INTRAMEDULLARY (IM) NAIL FEMORAL (Left Leg Upper)     Anesthesia Post Evaluation  Last Vitals:  Vitals:   04/01/18 1300 04/01/18 1922  BP: 120/74   Pulse: 91   Resp: 18   Temp:  36.5 C  SpO2: 95%     Last Pain:  Vitals:   04/01/18 1253  TempSrc:   PainSc: 9                  Topher Buen[place]entura

## 2018-04-01 NOTE — ED Notes (Signed)
Patient is currently in xray , c/o increased pain in leg. Dr. Rosalia Hammers aware , medications ordered.

## 2018-04-01 NOTE — ED Notes (Signed)
Returned from xray

## 2018-04-01 NOTE — Progress Notes (Signed)
Pt reports that he last used Fentanyl "the night before last."  Reports it's been 6 months since any Heroin use and that he "just got released from a facility."

## 2018-04-01 NOTE — ED Notes (Signed)
Patient c/o not being able to urinate, bladder scaned. Showed 720 cc in bladder.

## 2018-04-01 NOTE — Anesthesia Preprocedure Evaluation (Addendum)
Anesthesia Evaluation  Patient identified by MRN, date of birth, ID band Patient awake    Reviewed: Allergy & Precautions, NPO status , Patient's Chart, lab work & pertinent test results  Airway Mallampati: II  TM Distance: >3 FB Neck ROM: Full    Dental  (+) Dental Advisory Given, Edentulous Lower, Edentulous Upper   Pulmonary Current Smoker,    breath sounds clear to auscultation       Cardiovascular hypertension,  Rhythm:Regular Rate:Normal     Neuro/Psych negative neurological ROS     GI/Hepatic negative GI ROS, (+) Hepatitis -, C  Endo/Other    Renal/GU Renal disease     Musculoskeletal   Abdominal   Peds  Hematology  (+) anemia ,   Anesthesia Other Findings   Reproductive/Obstetrics                            Lab Results  Component Value Date   WBC 7.3 04/01/2018   HGB 12.6 (L) 04/01/2018   HCT 37.0 (L) 04/01/2018   MCV 84.1 04/01/2018   PLT 208 04/01/2018   Lab Results  Component Value Date   CREATININE 0.90 04/01/2018   BUN 30 (H) 04/01/2018   NA 139 04/01/2018   K 3.2 (L) 04/01/2018   CL 102 04/01/2018   CO2 24 04/01/2018    Anesthesia Physical Anesthesia Plan  ASA: III  Anesthesia Plan: General   Post-op Pain Management:    Induction: Intravenous  PONV Risk Score and Plan: 1 and Dexamethasone, Ondansetron and Treatment may vary due to age or medical condition  Airway Management Planned: Oral ETT  Additional Equipment:   Intra-op Plan:   Post-operative Plan: Extubation in OR  Informed Consent: I have reviewed the patients History and Physical, chart, labs and discussed the procedure including the risks, benefits and alternatives for the proposed anesthesia with the patient or authorized representative who has indicated his/her understanding and acceptance.   Dental advisory given  Plan Discussed with: CRNA  Anesthesia Plan Comments:         Anesthesia Quick Evaluation

## 2018-04-01 NOTE — Anesthesia Procedure Notes (Signed)
Procedure Name: Intubation Date/Time: 04/01/2018 5:59 PM Performed by: Gwenyth Allegra, CRNA Pre-anesthesia Checklist: Patient identified, Emergency Drugs available, Patient being monitored and Timeout performed Patient Re-evaluated:Patient Re-evaluated prior to induction Oxygen Delivery Method: Circle system utilized Induction Type: IV induction Ventilation: Oral airway inserted - appropriate to patient size Laryngoscope Size: Miller and 3 Grade View: Grade I Tube type: Oral Tube size: 7.5 mm Number of attempts: 1 Airway Equipment and Method: Stylet Placement Confirmation: positive ETCO2,  ETT inserted through vocal cords under direct vision and breath sounds checked- equal and bilateral Secured at: 21 cm Tube secured with: Tape Dental Injury: Teeth and Oropharynx as per pre-operative assessment

## 2018-04-01 NOTE — ED Notes (Signed)
Patient brought back to ED he refuses xrays, states he wants to go to [facility].

## 2018-04-01 NOTE — ED Provider Notes (Signed)
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       Wynelle Cleveland, MD 04/01/18 1204    Margarita Grizzle, MD 04/03/18 1258
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
## 2018-04-01 NOTE — ED Notes (Signed)
Orders for foley from Mattie Marlin PA

## 2018-04-01 NOTE — Transfer of Care (Signed)
Immediate Anesthesia Transfer of Care Note  Patient: Ethan [name]  Procedure(s) Performed: INTRAMEDULLARY (IM) NAIL FEMORAL (Left Leg Upper)  Patient Location: PACU  Anesthesia Type:General  Level of Consciousness: awake and alert   Airway & Oxygen Therapy: Patient Spontanous Breathing and Patient connected to nasal cannula oxygen  Post-op Assessment: Report given to RN and Post -op Vital signs reviewed and stable  Post vital signs: Reviewed and stable  Last Vitals:  Vitals Value Taken Time  BP 129/91 04/01/2018  7:19 PM  Temp 36.5 C 04/01/2018  7:22 PM  Pulse 85 04/01/2018  7:25 PM  Resp 11 04/01/2018  7:25 PM  SpO2 87 % 04/01/2018  7:25 PM  Vitals shown include unvalidated device data.  Last Pain:  Vitals:   04/01/18 1253  TempSrc:   PainSc: 9          Complications: No apparent anesthesia complications
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
## 2018-04-01 NOTE — Anesthesia Postprocedure Evaluation (Signed)
Anesthesia Post Note  Patient: Ethan [name]  Procedure(s) Performed: INTRAMEDULLARY (IM) NAIL FEMORAL (Left Leg Upper)     Patient location during evaluation: PACU Anesthesia Type: General Level of consciousness: awake and alert Pain management: pain level controlled Vital Signs Assessment: post-procedure vital signs reviewed and stable Respiratory status: spontaneous breathing, nonlabored ventilation, respiratory function stable and patient connected to nasal cannula oxygen Cardiovascular status: blood pressure returned to baseline and stable Postop Assessment: no apparent nausea or vomiting Anesthetic complications: no    Last Vitals:  Vitals:   04/01/18 1949 04/01/18 2004  BP: 123/76 126/76  Pulse: 90 95  Resp: 10 11  Temp:    SpO2: 91% 90%    Last Pain:  Vitals:   04/01/18 1959  TempSrc:   PainSc: 10-Worst pain ever                 Sheela Mcculley COKER
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
## 2018-04-02 ENCOUNTER — Encounter (HOSPITAL_COMMUNITY): Payer: Self-pay | Admitting: Student

## 2018-04-02 LAB — BASIC METABOLIC PANEL
ANION GAP: 7 (ref 5–15)
BUN: 12 mg/dL (ref 6–20)
CALCIUM: 7.6 mg/dL — AB (ref 8.9–10.3)
CO2: 26 mmol/L (ref 22–32)
Chloride: 104 mmol/L (ref 101–111)
Creatinine, Ser: 0.66 mg/dL (ref 0.61–1.24)
GFR calc non Af Amer: >60 mL/min (ref >60)
Glucose, Bld: 135 mg/dL — ABNORMAL HIGH (ref 65–99)
Potassium: 3.5 mmol/L (ref 3.5–5.1)
Sodium: 137 mmol/L (ref 135–145)

## 2018-04-02 LAB — CBC
HEMATOCRIT: 29.4 % — AB (ref 39.0–52.0)
Hemoglobin: 9.8 g/dL — ABNORMAL LOW (ref 13.0–17.0)
MCH: 28.7 pg (ref 26.0–34.0)
MCHC: 33.3 g/dL (ref 30.0–36.0)
MCV: 86 fL (ref 78.0–100.0)
Platelets: 123 10*3/uL — ABNORMAL LOW (ref 150–400)
RBC: 3.42 MIL/uL — ABNORMAL LOW (ref 4.22–5.81)
RDW: 15.8 % — AB (ref 11.5–15.5)
WBC: 4.7 10*3/uL (ref 4.0–10.5)

## 2018-04-02 MED ORDER — PANTOPRAZOLE SODIUM 40 MG PO TBEC: 40.0000 mg | DELAYED_RELEASE_TABLET | Freq: Two times a day (BID) | ORAL | Status: DC

## 2018-04-02 MED ORDER — HYDROMORPHONE HCL 2 MG/ML IJ SOLN
1.0000 mg | INTRAMUSCULAR | Status: DC | PRN
  Administered 2018-04-02 – 2018-04-05 (×11): 1 mg via INTRAVENOUS
  Filled 2018-04-02 (×11): qty 1

## 2018-04-02 MED ORDER — DULOXETINE HCL 30 MG PO CPEP
30.0000 mg | ORAL_CAPSULE | Freq: Every day | ORAL | Status: DC
  Administered 2018-04-02 – 2018-04-05 (×4): 30 mg via ORAL
  Filled 2018-04-02 (×4): qty 1

## 2018-04-02 MED ORDER — METHOCARBAMOL 750 MG PO TABS
750.0000 mg | ORAL_TABLET | Freq: Three times a day (TID) | ORAL | Status: DC
  Administered 2018-04-02 – 2018-04-04 (×6): 750 mg via ORAL
  Filled 2018-04-02 (×6): qty 1

## 2018-04-02 MED ORDER — ENOXAPARIN SODIUM 40 MG/0.4ML ~~LOC~~ SOLN
40.0000 mg | SUBCUTANEOUS | Status: DC
  Administered 2018-04-02 – 2018-04-04 (×3): 40 mg via SUBCUTANEOUS
  Filled 2018-04-02 (×3): qty 0.4

## 2018-04-02 MED ORDER — HYPROMELLOSE (GONIOSCOPIC) 2.5 % OP SOLN
1.0000 [drp] | OPHTHALMIC | Status: DC | PRN
  Filled 2018-04-02: qty 15

## 2018-04-02 MED ORDER — TAMSULOSIN HCL 0.4 MG PO CAPS
0.4000 mg | ORAL_CAPSULE | Freq: Two times a day (BID) | ORAL | Status: DC
  Administered 2018-04-02 – 2018-04-05 (×7): 0.4 mg via ORAL
  Filled 2018-04-02 (×7): qty 1

## 2018-04-02 MED ORDER — POLYVINYL ALCOHOL 1.4 % OP SOLN
1.0000 [drp] | OPHTHALMIC | Status: DC | PRN
  Administered 2018-04-03: 1 [drp] via OPHTHALMIC
  Filled 2018-04-02: qty 15

## 2018-04-02 MED ORDER — VALACYCLOVIR HCL 500 MG PO TABS
1000.0000 mg | ORAL_TABLET | Freq: Three times a day (TID) | ORAL | Status: DC
  Administered 2018-04-02 – 2018-04-05 (×9): 1000 mg via ORAL
  Filled 2018-04-02 (×9): qty 2

## 2018-04-02 MED ORDER — AMLODIPINE BESYLATE 5 MG PO TABS
5.0000 mg | ORAL_TABLET | Freq: Every day | ORAL | Status: DC
  Administered 2018-04-02 – 2018-04-05 (×4): 5 mg via ORAL
  Filled 2018-04-02 (×4): qty 1

## 2018-04-02 MED ORDER — QUETIAPINE FUMARATE 50 MG PO TABS
50.0000 mg | ORAL_TABLET | Freq: Every day | ORAL | Status: DC
  Administered 2018-04-02 – 2018-04-04 (×3): 50 mg via ORAL
  Filled 2018-04-02 (×3): qty 1

## 2018-04-02 MED ORDER — FERROUS SULFATE 325 (65 FE) MG PO TABS
325.0000 mg | ORAL_TABLET | Freq: Three times a day (TID) | ORAL | Status: DC
  Administered 2018-04-02 – 2018-04-05 (×8): 325 mg via ORAL
  Filled 2018-04-02 (×8): qty 1
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
## 2018-04-02 NOTE — Progress Notes (Signed)
PT Cancellation Note  Patient Details Name: ABDULAI BLAYLOCK MRN: [id number] DOB: [birth year]   Cancelled Treatment:    Reason Eval/Treat Not Completed: Fatigue/lethargy limiting ability to participate. Pt just finishing up with OT and fatigued. Will follow-up for PT evaluation tomorrow.  Ina Homes, PT, DPT Acute Rehab Services  Pager: [phone]  Malachy Chamber 04/02/2018, 3:20 PM
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
## 2018-04-03 ENCOUNTER — Inpatient Hospital Stay (HOSPITAL_COMMUNITY): Payer: Medicaid Other

## 2018-04-03 MED ORDER — IPRATROPIUM-ALBUTEROL 0.5-2.5 (3) MG/3ML IN SOLN
3.0000 mL | RESPIRATORY_TRACT | Status: DC | PRN
  Administered 2018-04-03: 3 mL via RESPIRATORY_TRACT
  Filled 2018-04-03: qty 3

## 2018-04-03 MED ORDER — POLYETHYLENE GLYCOL 3350 17 G PO PACK
17.0000 g | PACK | Freq: Every day | ORAL | Status: DC
  Filled 2018-04-03: qty 1
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
## 2018-04-03 NOTE — Progress Notes (Signed)
Per MD request pt instructed to perform several rounds of using incentive spirometer and to expel the mucus out upon coughing.  Pt given prn breathing tx to assist with productive cough.
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
## 2018-04-03 NOTE — Social Work (Addendum)
CSW completed SBIRT screening. No risk factors or interventions needed at this time however medical records review indicate history of alcohol use.  Keene Breath, LCSW Clinical Social Worker [phone]
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
## 2018-04-04 LAB — CBC
HCT: 26.7 % — ABNORMAL LOW (ref 39.0–52.0)
Hemoglobin: 9 g/dL — ABNORMAL LOW (ref 13.0–17.0)
MCH: 28.6 pg (ref 26.0–34.0)
MCHC: 33.7 g/dL (ref 30.0–36.0)
MCV: 84.8 fL (ref 78.0–100.0)
PLATELETS: 112 10*3/uL — AB (ref 150–400)
RBC: 3.15 MIL/uL — AB (ref 4.22–5.81)
RDW: 15.2 % (ref 11.5–15.5)
WBC: 4.3 10*3/uL (ref 4.0–10.5)

## 2018-04-04 MED ORDER — GUAIFENESIN ER 600 MG PO TB12
600.0000 mg | ORAL_TABLET | Freq: Two times a day (BID) | ORAL | Status: DC
  Administered 2018-04-04 – 2018-04-05 (×3): 600 mg via ORAL
  Filled 2018-04-04 (×3): qty 1

## 2018-04-04 MED ORDER — METHOCARBAMOL 500 MG PO TABS
1000.0000 mg | ORAL_TABLET | Freq: Three times a day (TID) | ORAL | Status: DC
  Administered 2018-04-04 – 2018-04-05 (×3): 1000 mg via ORAL
  Filled 2018-04-04 (×3): qty 2
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
## 2018-04-04 NOTE — [place] FL2 (Signed)
[place] MEDICAID FL2 LEVEL OF CARE SCREENING TOOL     IDENTIFICATION  Patient Name: Ethan [name] Birthdate: 1961-01-11 Sex: male Admission Date (Current Location): 04/01/2018  [facility] and [place] Number:  [employer] and Address:  [facility], [address], [place], [place] [address]      Provider Number: [id number]  Attending Physician Name and Address:  Md, Trauma, MD  Relative Name and Phone Number:       Current Level of Care: Hospital Recommended Level of Care: Skilled Nursing Facility Prior Approval Number:    Date Approved/Denied:   PASRR Number: pending  Discharge Plan: SNF    Current Diagnoses: Patient Active Problem List   Diagnosis Date Noted  . Right tibial fracture 04/01/2018  . Wound infection 09/21/2017  . Hyponatremia 05/15/2017  . Hepatitis C infection 01/17/2017  . Paraspinal abscess (HCC) 01/16/2017  . Below knee amputation status (HCC) 11/03/2015  . Protein-calorie malnutrition, severe (HCC) 06/21/2015  . Chronic lower back pain 06/20/2015  . Hypertension 06/20/2015  . Heroin use 06/20/2015  . Brain mass 08/29/2014  . Community acquired pneumonia 08/24/2014  . Sepsis due to pneumonia (HCC) 08/24/2014  . Anemia 08/24/2014    Orientation RESPIRATION BLADDER Height & Weight     Self, Time, Situation, Place  Normal Continent Weight: 129 lb (58.5 kg) Height:     BEHAVIORAL SYMPTOMS/MOOD NEUROLOGICAL BOWEL NUTRITION STATUS      Continent Diet(See DC summary)  AMBULATORY STATUS COMMUNICATION OF NEEDS Skin   Extensive Assist(Uses wheelchair at baseline, Left BKA ) Verbally Surgical wounds                       Personal Care Assistance Level of Assistance  Dressing, Bathing, Feeding Bathing Assistance: Maximum assistance Feeding assistance: Limited assistance Dressing Assistance: Maximum assistance     Functional Limitations Info  Sight, Hearing, Speech Sight Info: Adequate Hearing Info:  Adequate Speech Info: Adequate    SPECIAL CARE FACTORS FREQUENCY  PT (By licensed PT), OT (By licensed OT)     PT Frequency: 3x week OT Frequency: 2 x week            Contractures      Additional Factors Info  Code Status, Allergies, Psychotropic Code Status Info: Full Allergies Info: MOTRIN IBUPROFEN  Psychotropic Info: Seroquel         Current Medications (04/04/2018):  This is the current hospital active medication list Current Facility-Administered Medications  Medication Dose Route Frequency Provider Last Rate Last Dose  . 0.9 % NaCl with KCl 40 mEq / L  infusion   Intravenous Continuous Focht, Jessica L, PA 75 mL/hr at 04/02/18 1500 75 mL/hr at 04/02/18 1500  . acetaminophen (TYLENOL) tablet 650 mg  650 mg Oral Q4H Focht, Jessica L, PA   650 mg at 04/04/18 0527  . amLODipine (NORVASC) tablet 5 mg  5 mg Oral Daily Focht, Jessica L, PA   5 mg at 04/03/18 0948  . docusate sodium (COLACE) capsule 100 mg  100 mg Oral BID Focht, Jessica L, PA   100 mg at 04/03/18 0948  . DULoxetine (CYMBALTA) DR capsule 30 mg  30 mg Oral Daily Focht, Jessica L, PA   30 mg at 04/03/18 0949  . enoxaparin (LOVENOX) injection 40 mg  40 mg Subcutaneous Q24H Focht, Jessica L, PA   40 mg at 04/03/18 1344  . ferrous sulfate tablet 325 mg  325 mg Oral TID WC Focht,  Jessica L, PA   325 mg at 04/03/18 1655  . gabapentin (NEURONTIN) capsule 600 mg  600 mg Oral BID Mattie Marlin L, PA   600 mg at 04/03/18 2150  . hydrALAZINE (APRESOLINE) injection 10 mg  10 mg Intravenous Q2H PRN Focht, Jessica L, PA      . HYDROmorphone (DILAUDID) injection 1 mg  1 mg Intravenous Q4H PRN Focht, Jessica L, PA   1 mg at 04/04/18 0049  . ipratropium-albuterol (DUONEB) 0.5-2.5 (3) MG/3ML nebulizer solution 3 mL  3 mL Nebulization Q4H PRN Focht, Jessica L, PA   3 mL at 04/03/18 1455  . methocarbamol (ROBAXIN) tablet 750 mg  750 mg Oral TID Mattie Marlin L, PA   750 mg at 04/03/18 2150  . ondansetron (ZOFRAN-ODT)  disintegrating tablet 4 mg  4 mg Oral Q6H PRN Focht, Jessica L, PA       Or  . ondansetron (ZOFRAN) injection 4 mg  4 mg Intravenous Q6H PRN Focht, Jessica L, PA   4 mg at 04/01/18 1546  . oxyCODONE (Oxy IR/ROXICODONE) immediate release tablet 5-15 mg  5-15 mg Oral Q4H PRN Guy Sandifer, [place]   15 mg at 04/04/18 1610  . pantoprazole (PROTONIX) EC tablet 40 mg  40 mg Oral Daily Focht, Jessica L, PA   40 mg at 04/03/18 0950  . polyethylene glycol (MIRALAX / GLYCOLAX) packet 17 g  17 g Oral Daily Focht, Jessica L, PA      . polyvinyl alcohol (LIQUIFILM TEARS) 1.4 % ophthalmic solution 1 drop  1 drop Both Eyes PRN Focht, Jessica L, PA   1 drop at 04/03/18 0952  . QUEtiapine (SEROQUEL) tablet 50 mg  50 mg Oral QHS Focht, Jessica L, PA   50 mg at 04/03/18 2151  . tamsulosin (FLOMAX) capsule 0.4 mg  0.4 mg Oral BID Focht, Jessica L, PA   0.4 mg at 04/03/18 2150  . valACYclovir (VALTREX) tablet 1,000 mg  1,000 mg Oral TID Focht, Jessica L, PA   1,000 mg at 04/03/18 2150     Discharge Medications: Please see discharge summary for a list of discharge medications.  Relevant Imaging Results:  Relevant Lab Results:   Additional Information SS#: [id number]  Tresa Moore, LCSW

## 2018-04-04 NOTE — Social Work (Addendum)
ZOXWR#[id number] E received for SNF placement.  CSW f/u on SNF offers.  4:13pm CSW discussed bed offer and patient accepted bed offer from [facility].  CSW willf/u for disposition.  Keene Breath, LCSW Clinical Social Worker [phone]

## 2018-04-04 NOTE — Plan of Care (Signed)
  Problem: Pain Managment: Goal: General experience of comfort will improve Outcome: Progressing   

## 2018-04-05 MED ORDER — METHOCARBAMOL 500 MG PO TABS: 1000.0000 mg | ORAL_TABLET | Freq: Three times a day (TID) | ORAL | Status: AC

## 2018-04-05 MED ORDER — ASPIRIN EC 325 MG PO TBEC: 325.0000 mg | DELAYED_RELEASE_TABLET | Freq: Every day | ORAL | 3 refills | Status: AC

## 2018-04-05 MED ORDER — DOCUSATE SODIUM 100 MG PO CAPS: 100.0000 mg | ORAL_CAPSULE | Freq: Two times a day (BID) | ORAL | 0 refills | Status: AC

## 2018-04-05 MED ORDER — OXYCODONE HCL 5 MG PO TABS: 5.0000 mg | ORAL_TABLET | Freq: Four times a day (QID) | ORAL | 0 refills | Status: AC | PRN

## 2018-04-05 MED ORDER — POLYETHYLENE GLYCOL 3350 17 G PO PACK: 17.0000 g | PACK | Freq: Every day | ORAL | 0 refills | Status: AC | PRN
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
## 2018-04-05 NOTE — Clinical Social Work Placement (Signed)
   CLINICAL SOCIAL WORK PLACEMENT  NOTE  Date:  04/05/2018  Patient Details  Name: Ethan [name] MRN: [id number] Date of Birth: 11-11-1961  Clinical Social Work is seeking post-discharge placement for this patient at the Skilled  Nursing Facility level of care (*CSW will initial, date and re-position this form in  chart as items are completed):  Yes   Patient/family provided with [facility] Clinical Social Work Department's list of facilities offering this level of care within the geographic area requested by the patient (or if unable, by the patient's family).  Yes   Patient/family informed of their freedom to choose among providers that offer the needed level of care, that participate in Medicare, Medicaid or managed care program needed by the patient, have an available bed and are willing to accept the patient.  Yes   Patient/family informed of 's ownership interest in [facility] and [facility], as well as of the fact that they are under no obligation to receive care at these facilities.  PASRR submitted to EDS on       PASRR number received on 04/04/18     Existing PASRR number confirmed on       FL2 transmitted to all facilities in geographic area requested by pt/family on 04/03/18     FL2 transmitted to all facilities within larger geographic area on       Patient informed that his/her managed care company has contracts with or will negotiate with certain facilities, including the following:        Yes   Patient/family informed of bed offers received.  Patient chooses bed at [facility]     Physician recommends and patient chooses bed at      Patient to be transferred to [place] on 04/05/18.  Patient to be transferred to facility by [facility]     Patient family notified on 04/05/18 of transfer.  Name of family member notified:  pt responsible for self     PHYSICIAN Please prepare priority discharge summary, including medications      Additional Comment:   Macario Golds, LCSW [phone]

## 2018-04-05 NOTE — Clinical Social Work Note (Signed)
Clinical Social Worker facilitated patient discharge including contacting patient and facility to confirm patient discharge plans.  Clinical information faxed to facility and patient agreeable with plan.  CSW arranged ambulance transport via [facility] to [employer].  RN to call report prior to discharge.  Number for Report [phone] [facility] Social Worker will sign off for now as social work intervention is no longer needed. Please consult [place] again if new need arises.  Macario Golds, [place] [phone]

## 2018-04-05 NOTE — Discharge Instructions (Signed)
Cast or Splint Care, Adult Casts and splints are supports that are worn to protect broken bones and other injuries. A cast or splint may hold a bone still and in the correct position while it heals. Casts and splints may also help to ease pain, swelling, and muscle spasms. How to care for your cast  Do not stick anything inside the cast to scratch your skin.  Check the skin around the cast every day. Tell your doctor about any concerns.  You may put lotion on dry skin around the edges of the cast. Do not put lotion on the skin under the cast.  Keep the cast clean.  If the cast is not waterproof: ? Do not let it get wet. ? Cover it with a watertight covering when you take a bath or a shower. How to care for your splint  Wear it as told by your doctor. Take it off only as told by your doctor.  Loosen the splint if your fingers or toes tingle, get numb, or turn cold and blue.  Keep the splint clean.  If the splint is not waterproof: ? Do not let it get wet. ? Cover it with a watertight covering when you take a bath or a shower. Follow these instructions at home: Bathing  Do not take baths or swim until your doctor says it is okay. Ask your doctor if you can take showers. You may only be allowed to take sponge baths for bathing.  If your cast or splint is not waterproof, cover it with a watertight covering when you take a bath or shower. Managing pain, stiffness, and swelling  Move your fingers or toes often to avoid stiffness and to lessen swelling.  Raise (elevate) the injured area above the level of your heart while sitting or lying down. Safety  Do not use the injured limb to support your body weight until your doctor says that it is okay.  Use crutches or other assistive devices as told by your doctor. General instructions  Do not put pressure on any part of the cast or splint until it is fully hardened. This may take many hours.  Return to your normal activities as  told by your doctor. Ask your doctor what activities are safe for you.  Keep all follow-up visits as told by your doctor. This is important. Contact a doctor if:  Your cast or splint gets damaged.  The skin around the cast gets red or raw.  The skin under the cast is very itchy or painful.  Your cast or splint feels very uncomfortable.  Your cast or splint is too tight or too loose.  Your cast becomes wet or it starts to have a soft spot or area.  You get an object stuck under your cast. Get help right away if:  Your pain gets worse.  The injured area tingles, gets numb, or turns blue and cold.  The part of your body above or below the cast is swollen and it turns a different color (is discolored).  You cannot feel or move your fingers or toes.  There is fluid leaking through the cast.  You have very bad pain or pressure under the cast.  You have trouble breathing.  You have shortness of breath.  You have chest pain. This information is not intended to replace advice given to you by your health care provider. Make sure you discuss any questions you have with your health care provider. Document Released: 03/01/2011 Document   Revised: 10/20/2016 Document Reviewed: 10/20/2016 Elsevier Interactive Patient Education  2017 [employer]    Weightbearing: WBAT LLE, NWB RLE Insicional and dressing care: leave open to air with dry gauze PRN   1. PAIN CONTROL:  1. Pain is best controlled by a usual combination of three different methods TOGETHER:  1. Ice/Heat 2. Over the counter pain medication 3. Prescription pain medication 2. Most patients will experience some swelling and bruising around wounds. Ice packs or heating pads (30-60 minutes up to 6 times a day) will help. Use ice for the first few days to help decrease swelling and bruising, then switch to heat to help relax tight/sore spots and speed recovery. Some people prefer to use ice alone, heat alone, alternating  between ice & heat. Experiment to what works for you. Swelling and bruising can take several weeks to resolve.  3. It is helpful to take an over-the-counter pain medication regularly for the first few weeks. Choose one of the following that works best for you:  1. Naproxen (Aleve, etc) Two  tabs twice a day 2. Ibuprofen (Advil, etc) Three  tabs four times a day (every meal & bedtime) 3. Acetaminophen (Tylenol, etc) 500-650mg  four times a day (every meal & bedtime) 4. A prescription for pain medication (such as oxycodone, hydrocodone, etc) should be given to you upon discharge. Take your pain medication as prescribed.  1. If you are having problems/concerns with the prescription medicine (does not control pain, nausea, vomiting, rash, itching, etc), please call us [phone] to see if we need to switch you to a different pain medicine that will work better for you and/or control your side effect better. 2. If you need a refill on your pain medication, please contact your pharmacy. They will contact our office to request authorization. Prescriptions will not be filled after 5 pm or on week-ends. 4. Avoid getting constipated. When taking pain medications, it is common to experience some constipation. Increasing fluid intake and taking a fiber supplement (such as Metamucil, Citrucel, FiberCon, MiraLax, etc) 1-2 times a day regularly will usually help prevent this problem from occurring. A mild laxative (prune juice, Milk of Magnesia, MiraLax, etc) should be taken according to package directions if there are no bowel movements after 48 hours.  5. Watch out for diarrhea. If you have many loose bowel movements, simplify your diet to bland foods & liquids for a few days. Stop any stool softeners and decrease your fiber supplement. Switching to mild anti-diarrheal medications (Kayopectate, Pepto Bismol) can help. If this worsens or does not improve, please call us.  WHEN TO CALL us [phone]:    1. Poor pain control 2. Reactions / problems with new medications (rash/itching, nausea, etc)  3. Fever over 101.5 F (38.5 C) 4. Worsening swelling or bruising 5. Continued bleeding from wounds. 6. Increased pain, redness, or drainage from the wounds which could be signs of infection  The clinic staff is available to answer your questions during regular business hours (8:30am-5pm). Please dont hesitate to call and ask to speak to one of our nurses for clinical concerns.  If you have a medical emergency, go to the nearest emergency room or call 911.  A surgeon from [facility] is always on call at the [facility], [place]  [address], [place], [place] [address] ?  MAIN: [phone] ? TOLL FREE: [phone] ?  FAX [phone]  [email or web address]

## 2018-04-05 NOTE — Progress Notes (Signed)
Discharge instructions completed with pt.  Pt verbalizes understanding of the information.  Pt denies chest pain, shortness of breath, dizziness, lightheadedness, and n/v.  Pt alert and oriented x4.  Pt waiting for transportation.

## 2018-04-09 ENCOUNTER — Encounter: Payer: Self-pay | Admitting: Student

## 2018-04-09 DIAGNOSIS — S72142A Displaced intertrochanteric fracture of left femur, initial encounter for closed fracture: Secondary | ICD-10-CM | POA: Insufficient documentation

## 2018-06-03 ENCOUNTER — Ambulatory Visit: Payer: Self-pay

## 2018-10-05 ENCOUNTER — Other Ambulatory Visit: Payer: Self-pay

## 2018-10-05 ENCOUNTER — Emergency Department (HOSPITAL_COMMUNITY): Payer: Medicaid Other

## 2018-10-05 ENCOUNTER — Emergency Department (HOSPITAL_COMMUNITY)
Admission: EM | Admit: 2018-10-05 | Discharge: 2018-10-06 | Disposition: A | Discharge status: Home / Self Care | Payer: Medicaid Other | Attending: Emergency Medicine | Admitting: Emergency Medicine

## 2018-10-05 DIAGNOSIS — W050XXA Fall from non-moving wheelchair, initial encounter: Secondary | ICD-10-CM | POA: Insufficient documentation

## 2018-10-05 DIAGNOSIS — I10 Essential (primary) hypertension: Secondary | ICD-10-CM | POA: Insufficient documentation

## 2018-10-05 DIAGNOSIS — F17210 Nicotine dependence, cigarettes, uncomplicated: Secondary | ICD-10-CM | POA: Insufficient documentation

## 2018-10-05 DIAGNOSIS — M545 Low back pain, unspecified: Secondary | ICD-10-CM

## 2018-10-05 DIAGNOSIS — Z79899 Other long term (current) drug therapy: Secondary | ICD-10-CM | POA: Diagnosis not present

## 2018-10-05 DIAGNOSIS — Z89512 Acquired absence of left leg below knee: Secondary | ICD-10-CM | POA: Insufficient documentation

## 2018-10-05 NOTE — ED Notes (Signed)
Bed: [facility] Expected date:  Expected time:  Means of arrival:  Comments: 
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
## 2018-10-06 NOTE — Discharge Instructions (Signed)
X-ray was negative. Can take tylenol or motrin for pain. Follow-up with your primary care doctor. Return here for any new/acute changes.

## 2019-01-17 ENCOUNTER — Inpatient Hospital Stay (HOSPITAL_COMMUNITY): Payer: Medicaid Other

## 2019-01-17 ENCOUNTER — Encounter (HOSPITAL_COMMUNITY): Payer: Self-pay | Admitting: Emergency Medicine

## 2019-01-17 ENCOUNTER — Emergency Department (HOSPITAL_COMMUNITY): Payer: Medicaid Other

## 2019-01-17 ENCOUNTER — Inpatient Hospital Stay (HOSPITAL_COMMUNITY)
Admission: EM | Admit: 2019-01-17 | Discharge: 2019-02-12 | DRG: 917 | Disposition: E | Payer: Medicaid Other | Attending: Pulmonary Disease | Admitting: Pulmonary Disease

## 2019-01-17 DIAGNOSIS — R404 Transient alteration of awareness: Secondary | ICD-10-CM | POA: Diagnosis not present

## 2019-01-17 DIAGNOSIS — Z515 Encounter for palliative care: Secondary | ICD-10-CM | POA: Diagnosis present

## 2019-01-17 DIAGNOSIS — F17210 Nicotine dependence, cigarettes, uncomplicated: Secondary | ICD-10-CM | POA: Diagnosis present

## 2019-01-17 DIAGNOSIS — N189 Chronic kidney disease, unspecified: Secondary | ICD-10-CM | POA: Diagnosis present

## 2019-01-17 DIAGNOSIS — J9601 Acute respiratory failure with hypoxia: Secondary | ICD-10-CM | POA: Diagnosis present

## 2019-01-17 DIAGNOSIS — M545 Low back pain: Secondary | ICD-10-CM | POA: Diagnosis present

## 2019-01-17 DIAGNOSIS — R739 Hyperglycemia, unspecified: Secondary | ICD-10-CM | POA: Diagnosis present

## 2019-01-17 DIAGNOSIS — Z8249 Family history of ischemic heart disease and other diseases of the circulatory system: Secondary | ICD-10-CM

## 2019-01-17 DIAGNOSIS — G8929 Other chronic pain: Secondary | ICD-10-CM | POA: Diagnosis present

## 2019-01-17 DIAGNOSIS — G253 Myoclonus: Secondary | ICD-10-CM

## 2019-01-17 DIAGNOSIS — Z8661 Personal history of infections of the central nervous system: Secondary | ICD-10-CM | POA: Diagnosis not present

## 2019-01-17 DIAGNOSIS — F419 Anxiety disorder, unspecified: Secondary | ICD-10-CM | POA: Diagnosis present

## 2019-01-17 DIAGNOSIS — J9690 Respiratory failure, unspecified, unspecified whether with hypoxia or hypercapnia: Secondary | ICD-10-CM

## 2019-01-17 DIAGNOSIS — Z590 Homelessness: Secondary | ICD-10-CM | POA: Diagnosis not present

## 2019-01-17 DIAGNOSIS — L8990 Pressure ulcer of unspecified site, unspecified stage: Secondary | ICD-10-CM

## 2019-01-17 DIAGNOSIS — T424X1A Poisoning by benzodiazepines, accidental (unintentional), initial encounter: Secondary | ICD-10-CM | POA: Diagnosis present

## 2019-01-17 DIAGNOSIS — Z89512 Acquired absence of left leg below knee: Secondary | ICD-10-CM | POA: Diagnosis not present

## 2019-01-17 DIAGNOSIS — I129 Hypertensive chronic kidney disease with stage 1 through stage 4 chronic kidney disease, or unspecified chronic kidney disease: Secondary | ICD-10-CM | POA: Diagnosis present

## 2019-01-17 DIAGNOSIS — F1110 Opioid abuse, uncomplicated: Secondary | ICD-10-CM | POA: Diagnosis present

## 2019-01-17 DIAGNOSIS — I469 Cardiac arrest, cause unspecified: Secondary | ICD-10-CM | POA: Diagnosis present

## 2019-01-17 DIAGNOSIS — R569 Unspecified convulsions: Secondary | ICD-10-CM | POA: Diagnosis present

## 2019-01-17 DIAGNOSIS — E8729 Other acidosis: Secondary | ICD-10-CM | POA: Insufficient documentation

## 2019-01-17 DIAGNOSIS — Z66 Do not resuscitate: Secondary | ICD-10-CM | POA: Diagnosis not present

## 2019-01-17 DIAGNOSIS — E872 Acidosis: Secondary | ICD-10-CM | POA: Diagnosis present

## 2019-01-17 DIAGNOSIS — I361 Nonrheumatic tricuspid (valve) insufficiency: Secondary | ICD-10-CM

## 2019-01-17 DIAGNOSIS — Z79899 Other long term (current) drug therapy: Secondary | ICD-10-CM

## 2019-01-17 DIAGNOSIS — T40601A Poisoning by unspecified narcotics, accidental (unintentional), initial encounter: Secondary | ICD-10-CM | POA: Diagnosis present

## 2019-01-17 DIAGNOSIS — Z978 Presence of other specified devices: Secondary | ICD-10-CM

## 2019-01-17 DIAGNOSIS — Z7951 Long term (current) use of inhaled steroids: Secondary | ICD-10-CM

## 2019-01-17 DIAGNOSIS — Z886 Allergy status to analgesic agent status: Secondary | ICD-10-CM

## 2019-01-17 DIAGNOSIS — J449 Chronic obstructive pulmonary disease, unspecified: Secondary | ICD-10-CM | POA: Diagnosis present

## 2019-01-17 DIAGNOSIS — G629 Polyneuropathy, unspecified: Secondary | ICD-10-CM | POA: Diagnosis present

## 2019-01-17 DIAGNOSIS — Z7982 Long term (current) use of aspirin: Secondary | ICD-10-CM

## 2019-01-17 DIAGNOSIS — G931 Anoxic brain damage, not elsewhere classified: Secondary | ICD-10-CM | POA: Diagnosis present

## 2019-01-17 DIAGNOSIS — F1910 Other psychoactive substance abuse, uncomplicated: Secondary | ICD-10-CM | POA: Diagnosis not present

## 2019-01-17 LAB — RAPID URINE DRUG SCREEN, HOSP PERFORMED
AMPHETAMINES: NOT DETECTED
Barbiturates: NOT DETECTED
Benzodiazepines: POSITIVE — AB
Cocaine: NOT DETECTED
Opiates: POSITIVE — AB
Tetrahydrocannabinol: NOT DETECTED

## 2019-01-17 LAB — MRSA PCR SCREENING: MRSA by PCR: NEGATIVE

## 2019-01-17 LAB — POCT I-STAT 7, (LYTES, BLD GAS, ICA,H+H)
Acid-base deficit: 5 mmol/L — ABNORMAL HIGH (ref 0.0–2.0)
Acid-base deficit: 2 mmol/L (ref 0.0–2.0)
Bicarbonate: 21.6 mmol/L (ref 20.0–28.0)
Bicarbonate: 23.7 mmol/L (ref 20.0–28.0)
CALCIUM ION: 1.17 mmol/L (ref 1.15–1.40)
Calcium, Ion: 1.2 mmol/L (ref 1.15–1.40)
HCT: 36 % — ABNORMAL LOW (ref 39.0–52.0)
HCT: 35 % — ABNORMAL LOW (ref 39.0–52.0)
Hemoglobin: 12.2 g/dL — ABNORMAL LOW (ref 13.0–17.0)
Hemoglobin: 11.9 g/dL — ABNORMAL LOW (ref 13.0–17.0)
O2 SAT: 98 %
O2 SAT: 94 %
PH ART: 7.349 — AB (ref 7.350–7.450)
Patient temperature: 97
Potassium: 3.4 mmol/L — ABNORMAL LOW (ref 3.5–5.1)
Potassium: 3.2 mmol/L — ABNORMAL LOW (ref 3.5–5.1)
Sodium: 137 mmol/L (ref 135–145)
Sodium: 138 mmol/L (ref 135–145)
TCO2: 23 mmol/L (ref 22–32)
TCO2: 25 mmol/L (ref 22–32)
pCO2 arterial: 45.7 mmHg (ref 32.0–48.0)
pCO2 arterial: 43.1 mmHg (ref 32.0–48.0)
pH, Arterial: 7.278 — ABNORMAL LOW (ref 7.350–7.450)
pO2, Arterial: 120 mmHg — ABNORMAL HIGH (ref 83.0–108.0)
pO2, Arterial: 73 mmHg — ABNORMAL LOW (ref 83.0–108.0)

## 2019-01-17 LAB — CBC WITH DIFFERENTIAL/PLATELET
ABS IMMATURE GRANULOCYTES: 0.07 10*3/uL (ref 0.00–0.07)
BASOS ABS: 0 10*3/uL (ref 0.0–0.1)
BASOS PCT: 1 %
Eosinophils Absolute: 0.2 10*3/uL (ref 0.0–0.5)
Eosinophils Relative: 3 %
HCT: 39.8 % (ref 39.0–52.0)
Hemoglobin: 12.7 g/dL — ABNORMAL LOW (ref 13.0–17.0)
Immature Granulocytes: 1 %
Lymphocytes Relative: 37 %
Lymphs Abs: 2.3 10*3/uL (ref 0.7–4.0)
MCH: 26.7 pg (ref 26.0–34.0)
MCHC: 31.9 g/dL (ref 30.0–36.0)
MCV: 83.6 fL (ref 80.0–100.0)
Monocytes Absolute: 0.4 10*3/uL (ref 0.1–1.0)
Monocytes Relative: 6 %
NEUTROS ABS: 3.2 10*3/uL (ref 1.7–7.7)
NEUTROS PCT: 52 %
NRBC: 0 % (ref 0.0–0.2)
PLATELETS: 184 10*3/uL (ref 150–400)
RBC: 4.76 MIL/uL (ref 4.22–5.81)
RDW: 14.9 % (ref 11.5–15.5)
WBC: 6.2 10*3/uL (ref 4.0–10.5)

## 2019-01-17 LAB — TROPONIN I: Troponin I: <0.03 ng/mL (ref <0.03)

## 2019-01-17 LAB — COMPREHENSIVE METABOLIC PANEL
ALBUMIN: 3.6 g/dL (ref 3.5–5.0)
ALT: 42 U/L (ref 0–44)
ANION GAP: 18 — AB (ref 5–15)
AST: 72 U/L — ABNORMAL HIGH (ref 15–41)
Alkaline Phosphatase: 128 U/L — ABNORMAL HIGH (ref 38–126)
BILIRUBIN TOTAL: 1.3 mg/dL — AB (ref 0.3–1.2)
BUN: 17 mg/dL (ref 6–20)
CO2: 15 mmol/L — ABNORMAL LOW (ref 22–32)
Calcium: 8.6 mg/dL — ABNORMAL LOW (ref 8.9–10.3)
Chloride: 100 mmol/L (ref 98–111)
Creatinine, Ser: 1.1 mg/dL (ref 0.61–1.24)
GFR calc non Af Amer: >60 mL/min (ref >60)
GLUCOSE: 299 mg/dL — AB (ref 70–99)
POTASSIUM: 4 mmol/L (ref 3.5–5.1)
Sodium: 133 mmol/L — ABNORMAL LOW (ref 135–145)
TOTAL PROTEIN: 6.8 g/dL (ref 6.5–8.1)

## 2019-01-17 LAB — GLUCOSE, CAPILLARY
Glucose-Capillary: 84 mg/dL (ref 70–99)
Glucose-Capillary: 104 mg/dL — ABNORMAL HIGH (ref 70–99)
Glucose-Capillary: 119 mg/dL — ABNORMAL HIGH (ref 70–99)
Glucose-Capillary: 74 mg/dL (ref 70–99)
Glucose-Capillary: 76 mg/dL (ref 70–99)

## 2019-01-17 LAB — PROTIME-INR
INR: 1.1 (ref 0.8–1.2)
Prothrombin Time: 14.1 seconds (ref 11.4–15.2)

## 2019-01-17 LAB — URINALYSIS, ROUTINE W REFLEX MICROSCOPIC
Bilirubin Urine: NEGATIVE
Glucose, UA: >=500 mg/dL — AB
Ketones, ur: NEGATIVE mg/dL
Leukocytes,Ua: NEGATIVE
Nitrite: NEGATIVE
PH: 6 (ref 5.0–8.0)
Protein, ur: 100 mg/dL — AB
Specific Gravity, Urine: 1.013 (ref 1.005–1.030)

## 2019-01-17 LAB — LACTIC ACID, PLASMA
LACTIC ACID, VENOUS: 2.9 mmol/L — AB (ref 0.5–1.9)
Lactic Acid, Venous: 8.9 mmol/L (ref 0.5–1.9)

## 2019-01-17 LAB — HEMOGLOBIN A1C
Hgb A1c MFr Bld: 4.4 % — ABNORMAL LOW (ref 4.8–5.6)
Mean Plasma Glucose: 79.58 mg/dL

## 2019-01-17 LAB — ECHOCARDIOGRAM COMPLETE
Height: 72 in
Weight: 2240 oz

## 2019-01-17 LAB — ETHANOL

## 2019-01-17 LAB — BETA-HYDROXYBUTYRIC ACID: BETA-HYDROXYBUTYRIC ACID: 0.12 mmol/L (ref 0.05–0.27)

## 2019-01-17 LAB — APTT: aPTT: 33 seconds (ref 24–36)

## 2019-01-17 LAB — TRIGLYCERIDES: Triglycerides: 50 mg/dL (ref <150)

## 2019-01-17 LAB — ACETAMINOPHEN LEVEL: Acetaminophen (Tylenol), Serum: <10 ug/mL — ABNORMAL LOW (ref 10–30)

## 2019-01-17 LAB — SALICYLATE LEVEL: Salicylate Lvl: <7 mg/dL (ref 2.8–30.0)

## 2019-01-17 LAB — HIV ANTIBODY (ROUTINE TESTING W REFLEX): HIV Screen 4th Generation wRfx: NONREACTIVE

## 2019-01-17 MED ORDER — PANTOPRAZOLE SODIUM 40 MG IV SOLR
40.0000 mg | Freq: Every day | INTRAVENOUS | Status: DC
  Administered 2019-01-17: 40 mg via INTRAVENOUS
  Filled 2019-01-17: qty 40

## 2019-01-17 MED ORDER — CHLORHEXIDINE GLUCONATE 0.12% ORAL RINSE (MEDLINE KIT)
15.0000 mL | Freq: Two times a day (BID) | OROMUCOSAL | Status: DC
  Administered 2019-01-17 – 2019-01-18 (×3): 15 mL via OROMUCOSAL

## 2019-01-17 MED ORDER — PROPOFOL 1000 MG/100ML IV EMUL
INTRAVENOUS | Status: AC
  Administered 2019-01-17: 5 ug/kg/min
  Filled 2019-01-17: qty 100

## 2019-01-17 MED ORDER — PROPOFOL 1000 MG/100ML IV EMUL
0.0000 ug/kg/min | INTRAVENOUS | Status: DC
  Administered 2019-01-17: 55 ug/kg/min via INTRAVENOUS
  Administered 2019-01-17: 60 ug/kg/min via INTRAVENOUS
  Administered 2019-01-17: 40 ug/kg/min via INTRAVENOUS
  Administered 2019-01-18 (×3): 60 ug/kg/min via INTRAVENOUS
  Filled 2019-01-17 (×6): qty 100

## 2019-01-17 MED ORDER — INSULIN ASPART 100 UNIT/ML ~~LOC~~ SOLN: 2.0000 [IU] | SUBCUTANEOUS | Status: DC

## 2019-01-17 MED ORDER — FENTANYL BOLUS VIA INFUSION
25.0000 ug | INTRAVENOUS | Status: DC | PRN
  Filled 2019-01-17: qty 25

## 2019-01-17 MED ORDER — FENTANYL 2500MCG IN NS 250ML (10MCG/ML) PREMIX INFUSION: 25.0000 ug/h | INTRAVENOUS | Status: DC

## 2019-01-17 MED ORDER — MIDAZOLAM HCL 2 MG/2ML IJ SOLN
INTRAMUSCULAR | Status: AC
  Filled 2019-01-17: qty 2

## 2019-01-17 MED ORDER — SODIUM CHLORIDE 0.9 % IV SOLN
Freq: Once | INTRAVENOUS | Status: AC
  Administered 2019-01-17: 04:00:00 via INTRAVENOUS

## 2019-01-17 MED ORDER — ORAL CARE MOUTH RINSE
15.0000 mL | OROMUCOSAL | Status: DC
  Administered 2019-01-17 – 2019-01-19 (×13): 15 mL via OROMUCOSAL

## 2019-01-17 MED ORDER — LORAZEPAM 2 MG/ML IJ SOLN: 2.0000 mg | Freq: Once | INTRAMUSCULAR | Status: AC

## 2019-01-17 MED ORDER — FENTANYL BOLUS VIA INFUSION: 50.0000 ug | INTRAVENOUS | Status: DC | PRN

## 2019-01-17 MED ORDER — FENTANYL 2500MCG IN NS 250ML (10MCG/ML) PREMIX INFUSION
25.0000 ug/h | INTRAVENOUS | Status: DC
  Administered 2019-01-17: 50 ug/h via INTRAVENOUS
  Administered 2019-01-18: 150 ug/h via INTRAVENOUS
  Filled 2019-01-17 (×2): qty 250

## 2019-01-17 MED ORDER — DOCUSATE SODIUM 50 MG/5ML PO LIQD
100.0000 mg | Freq: Two times a day (BID) | ORAL | Status: DC | PRN
  Filled 2019-01-17: qty 10

## 2019-01-17 MED ORDER — HEPARIN SODIUM (PORCINE) 5000 UNIT/ML IJ SOLN: 5000.0000 [IU] | Freq: Three times a day (TID) | INTRAMUSCULAR | Status: DC

## 2019-01-17 MED ORDER — FENTANYL CITRATE (PF) 100 MCG/2ML IJ SOLN: 50.0000 ug | Freq: Once | INTRAMUSCULAR | Status: DC

## 2019-01-17 MED ORDER — HEPARIN SODIUM (PORCINE) 5000 UNIT/ML IJ SOLN
5000.0000 [IU] | Freq: Three times a day (TID) | INTRAMUSCULAR | Status: DC
  Administered 2019-01-17 – 2019-01-18 (×3): 5000 [IU] via SUBCUTANEOUS
  Filled 2019-01-17 (×3): qty 1

## 2019-01-17 MED ORDER — NOREPINEPHRINE 4 MG/250ML-% IV SOLN: 0.0000 ug/min | INTRAVENOUS | Status: DC

## 2019-01-17 MED ORDER — SODIUM CHLORIDE 0.9 % IV SOLN: INTRAVENOUS | Status: DC

## 2019-01-17 MED ORDER — MIDAZOLAM HCL 2 MG/2ML IJ SOLN
INTRAMUSCULAR | Status: AC
  Administered 2019-01-17: 2 mg via INTRAVENOUS
  Filled 2019-01-17: qty 2

## 2019-01-17 MED ORDER — LORAZEPAM 2 MG/ML IJ SOLN
INTRAMUSCULAR | Status: AC
  Administered 2019-01-17: 2 mg
  Filled 2019-01-17: qty 1

## 2019-01-17 MED ORDER — ASPIRIN 300 MG RE SUPP
300.0000 mg | RECTAL | Status: AC
  Administered 2019-01-17: 300 mg via RECTAL
  Filled 2019-01-17: qty 1

## 2019-01-17 MED ORDER — MIDAZOLAM HCL 2 MG/2ML IJ SOLN
2.0000 mg | Freq: Once | INTRAMUSCULAR | Status: AC
  Administered 2019-01-17: 2 mg via INTRAVENOUS

## 2019-01-17 MED ORDER — HEPARIN SODIUM (PORCINE) 5000 UNIT/ML IJ SOLN
5000.0000 [IU] | Freq: Three times a day (TID) | INTRAMUSCULAR | Status: DC
  Administered 2019-01-17: 5000 [IU] via SUBCUTANEOUS
  Filled 2019-01-17: qty 1

## 2019-01-17 MED ORDER — LORAZEPAM 2 MG/ML IJ SOLN
1.0000 mg | INTRAMUSCULAR | Status: DC | PRN
  Administered 2019-01-18 (×5): 1 mg via INTRAVENOUS
  Filled 2019-01-17 (×4): qty 1

## 2019-01-17 NOTE — Plan of Care (Signed)
Continue to monitor pt and assess neurologically

## 2019-01-17 NOTE — Progress Notes (Signed)
PCCM Overnight  Asked to evaluate by RN for myoclonic activity despite Propofol gtt Witnessed jerking type movements on exam Severe encephalopathy  and generalized epileptiform discharges were seen on EEG earlier today.  Assessment: Myoclonic activity secondary to Anoxic Brain Injury  Plan:  Ativan 2mg  x 1 dose now for suppression And prn Ativan    Signed Dr Newell [facility]

## 2019-01-17 NOTE — CV Procedure (Signed)
2D echo attempted but patient having A line placed. Will try later

## 2019-01-17 NOTE — Procedures (Signed)
Arterial Catheter Insertion Procedure Note Ethan [name] [id number] 04-Mar-1961  Procedure: Insertion of Arterial Catheter  Indications: Blood pressure monitoring and Frequent blood sampling  Procedure Details Consent: Unable to obtain consent because of emergent medical necessity. Time Out: Verified patient identification, verified procedure, site/side was marked, verified correct patient position, special equipment/implants available, medications/allergies/relevent history reviewed, required imaging and test results available.  Performed  Maximum sterile technique was used including antiseptics, cap, gloves, gown, hand hygiene, mask and sheet. Skin prep: Chlorhexidine; local anesthetic administered 20 gauge catheter was inserted into right radial artery using the Seldinger technique. ULTRASOUND GUIDANCE USED: NO Evaluation Blood flow good; BP tracing good. Complications: No apparent complications.   Christophe Louis Jan 29, 2019

## 2019-01-17 NOTE — ED Notes (Signed)
Patient placed on a monitor/pulse oximetry , RT secured ETT , EDP  at bedside , IV sites intact .

## 2019-01-17 NOTE — ED Notes (Signed)
ED TO INPATIENT HANDOFF REPORT  ED Nurse Name and Phone #:  Lucious Groves [phone]  S Name/Age/Gender Ethan [name] 58 y.o. male Room/Bed: RESUSC/RESUSC  Code Status   Code Status: Full Code  Home/SNF/Other Home {Patient oriented : SEDATED Is this baseline? NO  Triage Complete: Triage complete  Chief Complaint POST CPR  Triage Note Patient arrived with EMS post CPR , [place] unresponsive [place] a homeless camp - unknown down time , received 1 dose of Epinephrine 1 mg for PEA and CPR for 15 mins , ROSC at 0331 prior to arrival , intubated at scene, pulses present at arrival .   Allergies Allergies  Allergen Reactions  . Motrin [Ibuprofen] Nausea Only    Level of Care/Admitting Diagnosis ED Disposition    ED Disposition Condition Comment   Admit  Hospital Area: [name] [[id number]]  Level of Care: ICU [6]  Diagnosis: Cardiac arrest (HCC) [427.5.ICD-9-CM]  Admitting Physician: Carin Hock [[id number]]  Attending Physician: Carin Hock [[id number]]  Estimated length of stay: > 1 week  Certification:: I certify this patient will need inpatient services for at least 2 midnights  PT Class (Do Not Modify): Inpatient [101]  PT Acc Code (Do Not Modify): Private [1]       B Medical/Surgery History Past Medical History:  Diagnosis Date  . Anxiety   . Arthritis    "feet" (08/24/2014)  . Chronic lower back pain    "most of the time; sometimes it goes up into my upper back" (08/24/2014)  . Heroin use   . Hypertension   . Neuropathy   . Paraspinal abscess (HCC)   . Pneumonia 08/24/2014  . Renal insufficiency    Past Surgical History:  Procedure Laterality Date  . ABDOMINAL SURGERY    . AMPUTATION Left 11/03/2015   Procedure: Left Below Knee Amputation;  Surgeon: Nadara Mustard, MD;  Location: [facility] OR;  Service: Orthopedics;  Laterality: Left;  . BACK SURGERY     JUMPED OFF A BRIDGE 2013  . BELOW KNEE LEG AMPUTATION Left 11/03/2015  . FEMUR  IM NAIL Left 04/01/2018   Procedure: INTRAMEDULLARY (IM) NAIL FEMORAL;  Surgeon: Roby Lofts, MD;  Location: [facility] OR;  Service: Orthopedics;  Laterality: Left;  . HERNIA REPAIR    . LEG SURGERY     PINS/ PLATES  . POSTERIOR LUMBAR FUSION  2013   "got a broken rod and screw in there now" (08/24/2014)     A IV Location/Drains/Wounds Patient Lines/Drains/Airways Status   Active Line/Drains/Airways    Name:   Placement date:   Placement time:   Site:   Days:   Peripheral IV 2019-02-16 Left Forearm   02/16/19    -    Forearm   less than 1   Peripheral IV 02-16-19 Right Forearm   02/16/19    -    Forearm   less than 1   NG/OG Tube Orogastric 18 Fr. Center mouth   February 16, 2019    0426    Center mouth   less than 1   External Urinary Catheter   16-Feb-2019    0450    -   less than 1   Airway 7 mm   02/16/19    0420     less than 1   Incision (Closed) 04/01/18 Hip Left   04/01/18    1902     291   Incision (Closed) 04/01/18 Leg Right   04/01/18    1902  291   Wound / Incision (Open or Dehisced) 05/15/17 Incision - Open Back Left;Lower Hardware protruding through skin    05/15/17    1049    Back   612          Intake/Output Last 24 hours  Intake/Output Summary (Last 24 hours) at 02/16/19 0629 Last data filed at 02/16/2019 0450 Gross per 24 hour  Intake -  Output 0 ml  Net 0 ml    Labs/Imaging Results for orders placed or performed during the hospital encounter of Feb 16, 2019 (from the past 48 hour(s))  Comprehensive metabolic panel     Status: Abnormal   Collection Time: February 16, 2019  4:27 AM  Result Value Ref Range   Sodium 133 (L) 135 - 145 mmol/L   Potassium 4.0 3.5 - 5.1 mmol/L   Chloride 100 98 - 111 mmol/L   CO2 15 (L) 22 - 32 mmol/L   Glucose, Bld 299 (H) 70 - 99 mg/dL   BUN 17 6 - 20 mg/dL   Creatinine, Ser 1.44 0.61 - 1.24 mg/dL   Calcium 8.6 (L) 8.9 - 10.3 mg/dL   Total Protein 6.8 6.5 - 8.1 g/dL   Albumin 3.6 3.5 - 5.0 g/dL   AST 72 (H) 15 - 41 U/L   ALT 42 0 - 44 U/L    Alkaline Phosphatase 128 (H) 38 - 126 U/L   Total Bilirubin 1.3 (H) 0.3 - 1.2 mg/dL   GFR calc non Af Amer >60 >60 mL/min   GFR calc Af Amer >60 >60 mL/min   Anion gap 18 (H) 5 - 15    Comment: Performed at [facility], [address], [place], [place] [address]  CBC with Differential     Status: Abnormal   Collection Time: 2019/02/16  4:27 AM  Result Value Ref Range   WBC 6.2 4.0 - 10.5 K/uL   RBC 4.76 4.22 - 5.81 MIL/uL   Hemoglobin 12.7 (L) 13.0 - 17.0 g/dL   HCT 08.6 76.1 - 95.0 %   MCV 83.6 80.0 - 100.0 fL   MCH 26.7 26.0 - 34.0 pg   MCHC 31.9 30.0 - 36.0 g/dL   RDW 93.2 67.1 - 24.5 %   Platelets 184 150 - 400 K/uL   nRBC 0.0 0.0 - 0.2 %   Neutrophils Relative % 52 %   Neutro Abs 3.2 1.7 - 7.7 K/uL   Lymphocytes Relative 37 %   Lymphs Abs 2.3 0.7 - 4.0 K/uL   Monocytes Relative 6 %   Monocytes Absolute 0.4 0.1 - 1.0 K/uL   Eosinophils Relative 3 %   Eosinophils Absolute 0.2 0.0 - 0.5 K/uL   Basophils Relative 1 %   Basophils Absolute 0.0 0.0 - 0.1 K/uL   Immature Granulocytes 1 %   Abs Immature Granulocytes 0.07 0.00 - 0.07 K/uL    Comment: Performed at [facility], [address] [place], [place] [address]  Ethanol     Status: None   Collection Time: 2019-02-16  4:27 AM  Result Value Ref Range   Alcohol, Ethyl (B) <10 <10 mg/dL    Comment: (NOTE) Lowest detectable limit for serum alcohol is 10 mg/dL. For medical purposes only. Performed at [facility], [address], [place], [place] [address]   Acetaminophen level     Status: Abnormal   Collection Time: 02/16/19  4:27 AM  Result Value Ref Range   Acetaminophen (Tylenol), Serum <10 (L) 10 - 30 ug/mL  Comment: Performed at [facility], [address] [place], [place] [address]  Salicylate level     Status: None   Collection Time: Feb 14, 2019  4:27 AM  Result Value Ref Range   Salicylate Lvl <7.0 2.8 - 30.0 mg/dL    Comment: Performed at [facility], [address]  [place], [place] [address]  Troponin I - Once     Status: None   Collection Time: Feb 14, 2019  4:27 AM  Result Value Ref Range   Troponin I <0.03 <0.03 ng/mL    Comment: Performed at [facility], [address] [place], [place] [address]  Lactic acid, plasma     Status: Abnormal   Collection Time: 02-14-19  4:27 AM  Result Value Ref Range   Lactic Acid, Venous 8.9 (HH) 0.5 - 1.9 mmol/L    Comment: CRITICAL RESULT CALLED TO, READ BACK BY AND VERIFIED WITH: B.SANCHEZ,RN 2019-02-14 0522 DAVISB Performed at [facility], [address] [place], [place] [address]   I-STAT 7, (LYTES, BLD GAS, ICA, H+H)     Status: Abnormal   Collection Time: 02-14-19  5:36 AM  Result Value Ref Range   pH, Arterial 7.278 (L) 7.350 - 7.450   pCO2 arterial 45.7 32.0 - 48.0 mmHg   pO2, Arterial 120.0 (H) 83.0 - 108.0 mmHg   Bicarbonate 21.6 20.0 - 28.0 mmol/L   TCO2 23 22 - 32 mmol/L   O2 Saturation 98.0 %   Acid-base deficit 5.0 (H) 0.0 - 2.0 mmol/L   Sodium 137 135 - 145 mmol/L   Potassium 3.4 (L) 3.5 - 5.1 mmol/L   Calcium, Ion 1.17 1.15 - 1.40 mmol/L   HCT 36.0 (L) 39.0 - 52.0 %   Hemoglobin 12.2 (L) 13.0 - 17.0 g/dL   Patient temperature 65.7 F    Collection site RADIAL, ALLEN'S TEST ACCEPTABLE    Drawn by RT    Sample type ARTERIAL    Ct Head Wo Contrast  Result Date: 02/14/2019 CLINICAL DATA:  Unexplained altered level of consciousness EXAM: CT HEAD WITHOUT CONTRAST TECHNIQUE: Contiguous axial images were obtained from the base of the skull through the vertex without intravenous contrast. COMPARISON:  04/01/2018 FINDINGS: Brain: No evidence of acute infarct, hemorrhage, hydrocephalus, or collection. 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 The mass measures 13 mm maximum on axial slices, similar prior when remeasured in a similar fashion. Ventriculomegaly attributed to central predominant atrophy. There is notable caudate  atrophy with no reported history of movement disorder. Small-vessel ischemic changes in the periventricular white matter. Vascular: Atherosclerotic calcification. Skull: Negative Sinuses/Orbits: Multifocal sinus fluid levels in the setting of intubation. Sinusitis is chronic based on previous imaging IMPRESSION: 1. No acute finding. 2. Stable central predominant volume loss and benign intraventricular mass. Electronically Signed   By: Marnee Spring M.D.   On: 14-Feb-2019 05:01   Dg Chest Portable 1 View  Result Date: February 14, 2019 CLINICAL DATA:  Post CPR and intubation EXAM: PORTABLE CHEST 1 VIEW COMPARISON:  04/03/2018 FINDINGS: Endotracheal tube tip between the clavicular heads and carina. Normal heart size and mediastinal contours. There is no edema, consolidation, effusion, or pneumothorax. Remote, healed left rib fractures. No acute fracture noted. IMPRESSION: 1. No evidence of acute disease. 2. Endotracheal tube in good position. Electronically Signed   By: Marnee Spring M.D.   On: February 14, 2019 04:32    Pending Labs Unresulted Labs (From admission, onward)    Start  Ordered   2019-01-20 0645  Blood gas, arterial  Once,   R     20-Jan-2019 0602   01-20-2019 0628  Beta-hydroxybutyric acid  Once,   R     2019/01/20 0627   2019-01-20 0626  HIV antibody (Routine Testing)  Once,   R     20-Jan-2019 0626   20-Jan-2019 0528  Blood gas, arterial  Once,   R     20-Jan-2019 0450   01/20/2019 0422  Lactic acid, plasma  Now then every 2 hours,   STAT     2019/01/20 0421   01-20-2019 0422  Urine rapid drug screen (hosp performed)  ONCE - STAT,   STAT     01/20/2019 0421   Jan 20, 2019 0422  Blood culture (routine x 2)  BLOOD CULTURE X 2,   STAT     01-20-19 0421   01/20/2019 0421  Urinalysis, Routine w reflex microscopic  ONCE - STAT,   STAT     2019/01/20 0421          Vitals/Pain Today's Vitals   01/20/2019 0530 01/20/19 0545 01/20/19 0600 January 20, 2019 0615  BP: (!) 133/94 122/82 108/80 115/83  Pulse: 75 76 76 77  Resp: 15 18  18 18   Temp:      TempSrc:      SpO2: 100% 100% 98% 99%  Height:        Isolation Precautions No active isolations  Medications Medications  heparin injection 5,000 Units (has no administration in time range)  pantoprazole (PROTONIX) injection 40 mg (has no administration in time range)  insulin aspart (novoLOG) injection 2-6 Units (has no administration in time range)  propofol (DIPRIVAN) 1000 MG/100ML infusion (10 mcg/kg/min  Rate/Dose Change 01/20/2019 0448)  0.9 %  sodium chloride infusion ( Intravenous New Bag/Given 20-Jan-2019 0416)  midazolam (VERSED) injection 2 mg (2 mg Intravenous Given 01-20-19 0622)    Mobility : UNKNOWN     Focused Assessments NO FAMILY MEMBER PRESENT.   R Recommendations: See Admitting Provider Note  Report given to:   Additional Notes: OGT #18 , ETT , EXTERNAL CONDOM CATH , 2 PERIPHERAL IV , PROPOFOL DRIP / NS IV 100 ML/HR.

## 2019-01-17 NOTE — Progress Notes (Signed)
Nutrition Brief Note  Chart reviewed. Pt with hx of IV drug abuse; s/p PEA arrest. Currently intubated; concern for anoxic brain injury.  Spoke with Cindee Lame, NP with PCCM. Plan to hold off on nutrition support initiation. No nutrition interventions warranted at this time.   Please consult as needed.   Maureen Chatters, RD, LDN Pager #: [phone] After-Hours Pager #: [phone]

## 2019-01-17 NOTE — ED Notes (Signed)
Patient transported to CT scan . 

## 2019-01-17 NOTE — Progress Notes (Addendum)
RT obtained ABG 1 hour post intubation with the following results. ER MD requested that Rate be increased to 18 based on pts ABG. RT will continue to monitor.     Ref. Range 14-Feb-2019 05:36  Sample type Unknown ARTERIAL  pH, Arterial Latest Ref Range: 7.350 - 7.450  7.278 (L)  pCO2 arterial Latest Ref Range: 32.0 - 48.0 mmHg 45.7  pO2, Arterial Latest Ref Range: 83.0 - 108.0 mmHg 120.0 (H)  TCO2 Latest Ref Range: 22 - 32 mmol/L 23  Acid-base deficit Latest Ref Range: 0.0 - 2.0 mmol/L 5.0 (H)  Bicarbonate Latest Ref Range: 20.0 - 28.0 mmol/L 21.6  O2 Saturation Latest Units: % 98.0  Patient temperature Unknown 97.0 F  Collection site Unknown RADIAL, ALLEN'S TEST ACCEPTABLE

## 2019-01-17 NOTE — ED Notes (Signed)
Attempted to call report at [facility].

## 2019-01-17 NOTE — Progress Notes (Addendum)
RT called to Resus room for a post CPR that was intubated by EMS with a 7.0 ETT 25 @ lip being bagged. RT took over bagging pt until ETT placement confirmed. ER MD states ETT in good position can place on vent. Pt placed on PRVC VT520, rate 15, FIO2 40%, with 5 PEEP. Pt bucking vent, breath stacking. Sedation started. RT will obtain a blood gas in one hour. RT will continue to monitor.

## 2019-01-17 NOTE — H&P (Signed)
NAME:  Ethan [name], MRN:  [id number], DOB:  Apr 12, 1961, LOS: 0 ADMISSION DATE:  01-18-2019, CONSULTATION DATE: Jan 18, 2019 REFERRING MD: Dr. Judd Lien EDP, CHIEF COMPLAINT: Unwitnessed cardiac arrest  Brief History   58 year old male with past medical history of IV drug abuse found down at a homeless camp and PEA cardiac arrest.  History of present illness   58 year old male with past medical history as below, which is significant for IV drug abuse, spinal abscesses, Hypertension, and renal insufficiency.  In the early hours of 3/6 he was found to be unresponsive at a homeless camp and EMS was called.  Upon their arrival he was pulseless (PEA) and CPR was initiated.  He received approximately 15 minutes of CPR including 1 dose of IV epinephrine.  Intubated in the field.  He was transported to the emergency department where he remained unresponsive.  Imaging in the emergency department was unremarkable.  Laboratory evaluation also unremarkable.  He did develop some jerking type movements described as myoclonus which resolved with propofol infusion and PRN versed.  PCCM asked to admit.  Past Medical History    has a past medical history of Anxiety, Arthritis, Chronic lower back pain, Heroin use, Hypertension, Neuropathy, Paraspinal abscess (HCC), Pneumonia (08/24/2014), and Renal insufficiency.  Significant Hospital Events   3/6 admit  Consults:    Procedures:  ETT 3/6 >  Significant Diagnostic Tests:  CT head 3/6 > no acute change. Stable benign mass.  EEG 3/6 > Echo 3/6 >  Micro Data:  Blood 3/6 > Urine 3/6 >  Sputum 3/6 >  Antimicrobials:    Interim history/subjective:    Objective   Blood pressure 115/83, pulse 77, temperature (!) 97 F (36.1 C), temperature source Temporal, resp. rate 18, height 5\' 7"  (1.702 m), SpO2 99 %.    Vent Mode: PRVC FiO2 (%):  [40 %] 40 % Set Rate:  [15 bmp-18 bmp] 18 bmp Vt Set:  [520 mL] 520 mL PEEP:  [5 cmH20] 5 cmH20   Intake/Output  Summary (Last 24 hours) at 01-18-19 8828 Last data filed at 2019/01/18 0450 Gross per 24 hour  Intake -  Output 0 ml  Net 0 ml   There were no vitals filed for this visit.  Examination: General: thin middle aged male on vent HENT: [place]/AT, PERRL, no JVD Lungs: Clear Cardiovascular: RRR, no MRG Abdomen: Soft, non-distended Extremities: LLE remote amputation, RLE with foot wounds as below. Upper extremities with extensive evidence of IVDA Neuro: Coma GU: Foley  Resolved Hospital Problem list     Assessment & Plan:   Cardiac arrest with concern for anoxic injury: Likely etiology is overdose, although not truly known. 15 minutes PEA arrest after unwitnessed arrest. Reports of myoclonus in ED prompting propofol to be started and Versed PRN with improvement.  - Admit to ICU - Not a candidate for TTM - UDS - Echo pending - EEG  Inability to protect airway in the setting of above - Full vent support - Follow ABG - CXR as needed - VAP bundle - Propofol infusion for RASS goal 0 to -1.   Hyperglycemia with no history of DM - SSI  History of IVDA  - UDS - Culture blood    Best practice:  Diet: NPO Pain/Anxiety/Delirium protocol (if indicated): Propofol for RASS 0 to -1 VAP protocol (if indicated): Yes DVT prophylaxis: Heparin GI prophylaxis: PPI Glucose control: SSI Mobility: BR Code Status: FULL Family Communication: Brother Publishing copy notified via telephone.  Disposition: Admit to  ICU  Labs   CBC: Recent Labs  Lab 02-11-2019 0427 February 11, 2019 0536  WBC 6.2  --   NEUTROABS 3.2  --   HGB 12.7* 12.2*  HCT 39.8 36.0*  MCV 83.6  --   PLT 184  --     Basic Metabolic Panel: Recent Labs  Lab February 11, 2019 0427 11-Feb-2019 0536  NA 133* 137  K 4.0 3.4*  CL 100  --   CO2 15*  --   GLUCOSE 299*  --   BUN 17  --   CREATININE 1.10  --   CALCIUM 8.6*  --    GFR: CrCl cannot be calculated (Unknown ideal weight.). Recent Labs  Lab February 11, 2019 0427  WBC 6.2  LATICACIDVEN 8.9*     Liver Function Tests: Recent Labs  Lab 02-11-19 0427  AST 72*  ALT 42  ALKPHOS 128*  BILITOT 1.3*  PROT 6.8  ALBUMIN 3.6   No results for input(s): LIPASE, AMYLASE in the last 168 hours. No results for input(s): AMMONIA in the last 168 hours.  ABG    Component Value Date/Time   PHART 7.278 (L) 02-11-2019 0536   PCO2ART 45.7 February 11, 2019 0536   PO2ART 120.0 (H) 11-Feb-2019 0536   HCO3 21.6 02/11/19 0536   TCO2 23 February 11, 2019 0536   ACIDBASEDEF 5.0 (H) February 11, 2019 0536   O2SAT 98.0 Feb 11, 2019 0536     Coagulation Profile: No results for input(s): INR, PROTIME in the last 168 hours.  Cardiac Enzymes: Recent Labs  Lab 02/11/2019 0427  TROPONINI <0.03    HbA1C: No results found for: HGBA1C  CBG: No results for input(s): GLUCAP in the last 168 hours.  Review of Systems:   Unable as patient is enceaphalopathic and intubated.   Past Medical History  He,  has a past medical history of Anxiety, Arthritis, Chronic lower back pain, Heroin use, Hypertension, Neuropathy, Paraspinal abscess (HCC), Pneumonia (08/24/2014), and Renal insufficiency.   Surgical History    Past Surgical History:  Procedure Laterality Date  . ABDOMINAL SURGERY    . AMPUTATION Left 11/03/2015   Procedure: Left Below Knee Amputation;  Surgeon: Nadara Mustard, MD;  [facility];  Service: Orthopedics;  Laterality: Left;  . BACK SURGERY     JUMPED OFF A BRIDGE 2013  . BELOW KNEE LEG AMPUTATION Left 11/03/2015  . FEMUR IM NAIL Left 04/01/2018   Procedure: INTRAMEDULLARY (IM) NAIL FEMORAL;  Surgeon: Roby Lofts, MD;  Location: [facility] OR;  Service: Orthopedics;  Laterality: Left;  . HERNIA REPAIR    . LEG SURGERY     PINS/ PLATES  . POSTERIOR LUMBAR FUSION  2013   "got a broken rod and screw in there now" (08/24/2014)     Social History   reports that he has been smoking cigarettes. He has a 67.50 pack-year smoking history. He has never used smokeless tobacco. He reports current alcohol use.  He reports current drug use. Drugs: IV and Marijuana.   Family History   His family history includes Hypertension in an other family member.   Allergies Allergies  Allergen Reactions  . Motrin [Ibuprofen] Nausea Only     Home Medications  Prior to Admission medications   Medication Sig Start Date End Date Taking? Authorizing Provider  acetaminophen (TYLENOL) 500 MG tablet Take 1,000 mg by mouth every 6 (six) hours as needed for mild pain.    [provider]  amLODipine (NORVASC) 5 MG tablet Take 5 mg by mouth daily. 10/13/17   [provider]  aspirin EC 325 MG tablet Take 1 tablet (325 mg total) by mouth daily. 04/05/18 04/05/19  Jerre Simon, PA  docusate sodium (COLACE) 100 MG capsule Take 1 capsule (100 mg total) by mouth 2 (two) times daily. 04/05/18   Focht, Joyce Copa, PA  DULoxetine (CYMBALTA) 30 MG capsule Take 30 mg by mouth daily. 01/03/18   [provider]  ferrous sulfate 325 (65 FE) MG tablet Take 325 mg by mouth 3 (three) times daily with meals. 10/13/17   [provider]  fluticasone (FLONASE) 50 MCG/ACT nasal spray Place 2 sprays into both nostrils daily.    [provider]  gabapentin (NEURONTIN) 300 MG capsule Take 600 mg by mouth 2 (two) times daily. 03/05/18   [provider]  hydroxypropyl methylcellulose / hypromellose (ISOPTO TEARS / GONIOVISC) 2.5 % ophthalmic solution Place 1 drop into both eyes as needed for dry eyes.    [provider]  methocarbamol (ROBAXIN) 500 MG tablet Take 2 tablets (1,000 mg total) by mouth 3 (three) times daily. 04/05/18   Focht, Joyce Copa, PA  mirtazapine (REMERON) 45 MG tablet Take 45 mg by mouth at bedtime. 01/17/18   [provider]  oxyCODONE (OXY IR/ROXICODONE) 5 MG immediate release tablet Take 1 tablet (5 mg total) by mouth every 6 (six) hours as needed for moderate pain or severe pain (5mg  for moderate pain, 10mg  for severe pain). 04/05/18   Focht, Joyce Copa, PA   pantoprazole (PROTONIX) 40 MG tablet Take 40 mg by mouth 2 (two) times daily. 10/13/17   [provider]  polyethylene glycol (MIRALAX / GLYCOLAX) packet Take 17 g by mouth daily as needed for moderate constipation. 04/05/18   Focht, Joyce Copa, PA  QUEtiapine (SEROQUEL) 50 MG tablet Take 50 mg by mouth at bedtime. 01/31/18   [provider]  tamsulosin (FLOMAX) 0.4 MG CAPS capsule Take 0.4 mg by mouth 2 (two) times daily.    [provider]  thiamine 100 MG tablet Take 100 mg by mouth daily. 10/13/17   [provider]  valACYclovir (VALTREX) 1000 MG tablet Take 1,000 mg by mouth 3 (three) times daily. 11/30/17 02/15/19  [provider]     Critical care time: 40 mins

## 2019-01-17 NOTE — ED Notes (Signed)
Ice packs applied at bilateral axilla/groin per EDP .
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
## 2019-01-17 NOTE — ED Notes (Addendum)
Returned from CT scan , ETT/OGT and IV sites intact , ice packs intact , waiting for admitting ICU MD . Propofol drip infusing at 10 mcg/min.

## 2019-01-17 NOTE — Progress Notes (Signed)
  Echocardiogram 2D Echocardiogram has been performed.  Janalyn Harder 01/12/2019, 11:42 AM

## 2019-01-17 NOTE — Progress Notes (Signed)
RT transported pt from ED Resus to CT and back without complication. Pt remained stable throughout transport. RT will continue to monitor.

## 2019-01-17 NOTE — ED Notes (Signed)
ETT/OGT intact , IV sites unremarkable , NS infusing/ Propofol drip infusing at 10 mcg/min . External condom cath intact with no urine output.
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
## 2019-01-17 NOTE — Procedures (Signed)
  [facility] Abem Shaddix A. Gerilyn Pilgrim, MD     [email or web address]           HISTORY: The patient is a 58 year old male who was found unresponsive status post cardiac arrest.  The study is being to assess for residual neurologic function is an nonconvulsive seizures.  MEDICATIONS:  Current Facility-Administered Medications:  .  docusate (COLACE) 50 MG/5ML liquid 100 mg, 100 mg, Per Tube, BID PRN, Scatliffe, Kristen D, MD .  fentaNYL (SUBLIMAZE) bolus via infusion 25 mcg, 25 mcg, Intravenous, Q1H PRN, Simonne Martinet, NP .  fentaNYL (SUBLIMAZE) injection 50 mcg, 50 mcg, Intravenous, Once, Zenia Resides E, NP .  fentaNYL in NS (55mcg/ml) infusion-PREMIX, 25-400 mcg/hr, Intravenous, Continuous, Tanja Port, Peter E, NP .  heparin injection 5,000 Units, 5,000 Units, Subcutaneous, Q8H, Babcock, Peter E, NP .  insulin aspart (novoLOG) injection 2-6 Units, 2-6 Units, Subcutaneous, Q4H, Scatliffe, Kristen D, MD .  norepinephrine (LEVOPHED) 4mg  in premix infusion, 0-50 mcg/min, Intravenous, Titrated, Scatliffe, Kristen D, MD .  pantoprazole (PROTONIX) injection 40 mg, 40 mg, Intravenous, QHS, Scatliffe, Kristen D, MD .  propofol (DIPRIVAN) 1000 MG/100ML infusion, 0-60 mcg/kg/min, Intravenous, Continuous, Babcock, Peter E, NP     ANALYSIS: A 16 channel recording using standard 10 20 measurements is conducted for 27 minutes.  There is severe voltage suppression with the gain having to be turned up to 2vU/mm for appreciable activities to be appreciated.  At this level the background activity of 10 hertz is observed.  This goes on for extended with infrequent interruption of high amplitude generalized burst of polyspike slow wave activity with maximum activity in at the left centrotemporal region particularly T3 and C3.   Photic stimulation and hyperventilation are not conducted.   IMPRESSION: This recording is abnormal for the following reasons: 1.  Severe voltage  suppression likely indicating severe encephalopathy from various etiologies including possible from medication effect. 2.  Infrequent burst of generalized of epileptiform discharges with maximum activity involving the left centro-temporal region.        Analyn Matusek A. Gerilyn Pilgrim, M.D.  Diplomate, Biomedical engineer of Psychiatry and Neurology ( Neurology).
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
## 2019-01-17 NOTE — Progress Notes (Signed)
Brother at bedside.  Had a long discussion about patient's chronic health as well as current state of health.  We discussed current findings and concern about likely anoxic brain injury and current short-term plan of care.  Plan at this point Full DO NOT RESUSCITATE No escalation of care We will continue supportive care including ventilatory management Avoiding fever Evaluate and treat seizure if present If no improvement in the next 24 to 48 hours will need MRI   Simonne Martinet ACNP-BC [facility] Pulmonary/Critical Care Pager # [phone] OR # [phone] if no answer

## 2019-01-18 DIAGNOSIS — L8990 Pressure ulcer of unspecified site, unspecified stage: Secondary | ICD-10-CM

## 2019-01-18 LAB — GLUCOSE, CAPILLARY
Glucose-Capillary: 101 mg/dL — ABNORMAL HIGH (ref 70–99)
Glucose-Capillary: 75 mg/dL (ref 70–99)
Glucose-Capillary: 92 mg/dL (ref 70–99)

## 2019-01-18 LAB — URINE CULTURE: Culture: NO GROWTH

## 2019-01-18 MED ORDER — ACETAMINOPHEN 650 MG RE SUPP: 650.0000 mg | Freq: Four times a day (QID) | RECTAL | Status: DC | PRN

## 2019-01-18 MED ORDER — LORAZEPAM 2 MG/ML IJ SOLN
INTRAMUSCULAR | Status: AC
  Filled 2019-01-18: qty 1

## 2019-01-18 MED ORDER — POLYVINYL ALCOHOL 1.4 % OP SOLN
1.0000 [drp] | Freq: Four times a day (QID) | OPHTHALMIC | Status: DC | PRN
  Administered 2019-01-19: 1 [drp] via OPHTHALMIC
  Filled 2019-01-18: qty 15

## 2019-01-18 MED ORDER — DEXTROSE 5 % IV SOLN
INTRAVENOUS | Status: DC
  Administered 2019-01-18: 13:00:00 via INTRAVENOUS

## 2019-01-18 MED ORDER — ACETAMINOPHEN 325 MG PO TABS: 650.0000 mg | ORAL_TABLET | Freq: Four times a day (QID) | ORAL | Status: DC | PRN

## 2019-01-18 MED ORDER — MORPHINE SULFATE (PF) 2 MG/ML IV SOLN: 2.0000 mg | INTRAVENOUS | Status: DC | PRN

## 2019-01-18 MED ORDER — MORPHINE 100MG IN NS 100ML (1MG/ML) PREMIX INFUSION
0.0000 mg/h | INTRAVENOUS | Status: DC
  Administered 2019-01-18: 20 mg/h via INTRAVENOUS
  Administered 2019-01-18: 30 mg/h via INTRAVENOUS
  Administered 2019-01-18: 5 mg/h via INTRAVENOUS
  Administered 2019-01-19 (×3): 30 mg/h via INTRAVENOUS
  Filled 2019-01-18 (×7): qty 100

## 2019-01-18 MED ORDER — LORAZEPAM 2 MG/ML IJ SOLN
1.0000 mg | INTRAMUSCULAR | Status: DC | PRN
  Administered 2019-01-18 – 2019-01-19 (×3): 1 mg via INTRAVENOUS
  Filled 2019-01-18 (×3): qty 1

## 2019-01-18 MED ORDER — GLYCOPYRROLATE 1 MG PO TABS
1.0000 mg | ORAL_TABLET | ORAL | Status: DC | PRN
  Filled 2019-01-18: qty 1

## 2019-01-18 MED ORDER — GLYCOPYRROLATE 0.2 MG/ML IJ SOLN
0.2000 mg | INTRAMUSCULAR | Status: DC | PRN
  Administered 2019-01-18 – 2019-01-19 (×2): 0.2 mg via INTRAVENOUS
  Filled 2019-01-18 (×2): qty 1

## 2019-01-18 MED ORDER — DIPHENHYDRAMINE HCL 50 MG/ML IJ SOLN: 25.0000 mg | INTRAMUSCULAR | Status: DC | PRN

## 2019-01-18 MED ORDER — GLYCOPYRROLATE 0.2 MG/ML IJ SOLN: 0.2000 mg | INTRAMUSCULAR | Status: DC | PRN

## 2019-01-18 MED ORDER — POTASSIUM CHLORIDE 20 MEQ/15ML (10%) PO SOLN
40.0000 meq | Freq: Once | ORAL | Status: AC
  Administered 2019-01-18: 40 meq
  Filled 2019-01-18: qty 30

## 2019-01-18 MED ORDER — SODIUM CHLORIDE 0.9 % IV SOLN
5.0000 mg/h | INTRAVENOUS | Status: DC
  Administered 2019-01-18: 2 mg/h via INTRAVENOUS
  Administered 2019-01-19: 5 mg/h via INTRAVENOUS
  Filled 2019-01-18 (×2): qty 10

## 2019-01-18 MED ORDER — MORPHINE BOLUS VIA INFUSION
5.0000 mg | INTRAVENOUS | Status: DC | PRN
  Administered 2019-01-18 (×3): 5 mg via INTRAVENOUS
  Filled 2019-01-18: qty 5

## 2019-01-18 NOTE — Progress Notes (Signed)
Nutrition Brief Note  RD consulted for initiation of nutrition support this morning.   However, after further goals of care discussion between MD and pts siblings, family has elected to withdraw care.   No indication for nutrition support. Will d/c consult.   Christophe Louis RD, LDN, CNSC Clinical Nutrition Available Tues-Sat via Pager: [phone] 01/18/2019 2:22 PM
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
## 2019-01-18 NOTE — Progress Notes (Signed)
Pt breathing labored, RR >40/min. 2 morphine boluses given via infusion. Robinul given. Pt remains labored, tachypneic. Dr. Isaiah Serge notified. Per new orders titrated Morphine gtt to 30, additional bolus given, Ativan 1 mg given IV.

## 2019-01-18 NOTE — Progress Notes (Signed)
Pt extubated to RA. Family at bedside. Comforted verbally.

## 2019-01-18 NOTE — Progress Notes (Signed)
NAME:  Ethan [name], MRN:  [id number], DOB:  Apr 14, 1961, LOS: 1 ADMISSION DATE:  01/24/2019, CONSULTATION DATE: January 17, 2019 REFERRING MD: Dr. Judd Lien EDP, CHIEF COMPLAINT: Unwitnessed cardiac arrest  Brief History   58 year old male with past medical history of IV drug abuse found [place] a homeless [place] and PEA cardiac arrest.   Past Medical History    has a past medical history of Anxiety, Arthritis, Chronic lower back pain, Heroin use, Hypertension, Neuropathy, Paraspinal abscess (HCC), Pneumonia (08/24/2014), and Renal insufficiency.  Significant Hospital Events   3/6 admit, seizing on arrival to the ICU.  Appeared to resolve clinically with propofol and Versed, still having intermittent myoclonic activity.  EEG ordered  Consults:    Procedures:  ETT 3/6 >  Significant Diagnostic Tests:  CT head 3/6 > no acute change. Stable benign mass.   EEG 3/6 >Severe voltage suppression likely indicating severe encephalopathy  Infrequent burst of generalized of epileptiform discharges with maximum activity involving the left centro-temporal region.  Echo 3/6 >EF 60-65%, RV severely reduced systolic fxn , severely enlarged, RA dilated, small pericardial effusion, degenerative MV, mod TV regurg, RA press  Micro Data:  Blood 3/6 > Urine 3/6 >  Sputum 3/6 >  Antimicrobials:    Interim history/subjective:  Seizure activity /jerking overnight on propofol gtt, ativan given w/ improvement.  Resting this am on vent , no jerking noted.  Unresponsive.    Objective   Blood pressure 118/78, pulse 96, temperature 98.6 F (37 C), temperature source Esophageal, resp. rate 18, height 6' (1.829 m), weight 63.5 kg, SpO2 100 %.    Vent Mode: PRVC FiO2 (%):  [50 %-60 %] 50 % Set Rate:  [18 bmp] 18 bmp Vt Set:  [520 mL] 520 mL PEEP:  [5 cmH20] 5 cmH20 Plateau Pressure:  [13 cmH20-14 cmH20] 14 cmH20   Intake/Output Summary (Last 24 hours) at 01/18/2019 2119 Last data filed at 01/18/2019  0600 Gross per 24 hour  Intake 790.35 ml  Output 1300 ml  Net -509.65 ml   Filed Weights   01/23/2019 0630  Weight: 63.5 kg    Examination: General: Chronically ill-appearing male unresponsive on vent  HEENT orally intubated  Pulmonary: Diminished breath sounds in the bases otherwise clear  Cardiac: Regular rate and rhythm no MRG  Abdomen soft, flat hypoactive bowel sounds no organomegaly noted  Extremities: Left BKA, necrotic lesions along right toes  Neuro: Unresponsive  GU: Foley  Resolved Hospital Problem list     Assessment & Plan:   Cardiac arrest with concern for anoxic injury: Likely etiology is overdose, although not truly known. 15 minutes PEA arrest after unwitnessed arrest.  Urine drug screen positive for benzos and opiates Plan Obtaining stat EEG Titrating propofol and fentanyl for ventilator mechanics Normothermia Supportive care Will need MRI of brain  Inability to protect airway with probable underlying COPD - Plan Chest x-ray in a.m. Continuing full ventilator support RASS goal to -2 to -3 given ventilator mechanics Continue scheduled bronchodilators VAP bundle  At risk for fluid and electrolyte imbalance Plan Trend chemistries Replace K+    Hyperglycemia with no history of DM Plan Sliding scale insulin  History of IVDA -positive for benzos and opiates Plan Follow-up blood cultures   Acute Encephalopathy likely from Anoxic Brain Injury  Seizure Activity  EEG 3/6 with severe encephlopathy , infrequent burst of gen epileptiform discharges .   Plan  Cont Propofol  Ativan As needed   Consult Neuro As needed  Best practice:  Diet: TF added 3/7  Pain/Anxiety/Delirium protocol (if indicated): Propofol and fentanyl, RASS goal to -2 to -3 given require more issues and think VAP protocol (if indicated): Yes DVT prophylaxis: Heparin GI prophylaxis: PPI Glucose control: SSI Mobility: BR Code Status: DNR , no escalation of care  Family  Communication: Brother Trey Paula notified via telephone.3/6  Disposition: He is critically ill.  Exam is consistent with anoxic brain injury.      Critical care time:    Oscar Hank NP-C  [facility]  [phone]  01/18/2019

## 2019-01-18 NOTE — Progress Notes (Signed)
Pt remains tachypneic, tachycardic. Breathing labored. Dr. Isaiah Serge paged.

## 2019-01-18 NOTE — Progress Notes (Signed)
Pt extubated per MD order. Family at bedside 

## 2019-01-19 ENCOUNTER — Inpatient Hospital Stay (HOSPITAL_COMMUNITY): Payer: Medicaid Other

## 2019-01-19 ENCOUNTER — Other Ambulatory Visit: Payer: Self-pay

## 2019-01-22 ENCOUNTER — Telehealth: Payer: Self-pay | Admitting: *Deleted

## 2019-01-22 LAB — CULTURE, BLOOD (ROUTINE X 2)
Culture: NO GROWTH
Culture: NO GROWTH
Special Requests: ADEQUATE

## 2019-01-22 NOTE — Telephone Encounter (Signed)
Received original D/C.-D/C forwarded to Dr.Mannam to be signed.

## 2019-01-23 NOTE — Telephone Encounter (Signed)
Received original signed D/C-D/C forwarded to [facility] as requested.  

## 2019-02-12 NOTE — Plan of Care (Signed)
CDS notified of pt's time of death

## 2019-02-12 NOTE — Progress Notes (Signed)
Patient pronounced at 0830 by Edilia Bo, RN and Reinaldo Meeker, RN.  CDS notified.  Family notified.  Taken down to [employer]

## 2019-02-12 NOTE — Discharge Summary (Signed)
Physician Death Summary  Patient ID: THORNE SIEGENTHALER MRN: [id number] DOB/AGE: 07-02-1961 58 y.o.  Admit date: 01/15/2019 Discharge date: Jan 26, 2019  Admission Diagnoses: Cardiac arrest  Discharge Diagnoses:  Active Problems:   Cardiac arrest (HCC)   Myoclonus   Pressure injury of skin Anoxic brain injury  Discharged Condition: Deceased  Hospital Course:  58 year old male with past medical history of IV drug abuse found down at a homeless camp and PEA cardiac arrest. He has has a past medical history of Anxiety, Arthritis, Chronic lower back pain, Heroin use, Hypertension, Neuropathy, Paraspinal abscess (HCC), Pneumonia (08/24/2014), and Renal insufficiency.  Patient had seizure-like activity with myoclonus on admission, evidence of severe anoxic brain injury on examination and EEG.  He underwent normothermia protocol. After discussion with brothers he was transitioned to DNR.  On the morning of 3/7 with no improvement in mental status he was transitioned to comfort care and started on morphine and Dilaudid drip.  He passed away on 26-Jan-2019.   Signed: Kimari Coudriet January 26, 2019, 11:21 AM

## 2019-02-12 NOTE — Progress Notes (Signed)
NAME:  Ethan [name], MRN:  [id number], DOB:  02-12-1961, LOS: 2 ADMISSION DATE:  Jan 23, 2019, CONSULTATION DATE: 01-23-19 REFERRING MD: Dr. Judd Lien EDP, CHIEF COMPLAINT: Unwitnessed cardiac arrest  Brief History   58 year old male with past medical history of IV drug abuse found down at a homeless camp and PEA cardiac arrest.   Past Medical History    has a past medical history of Anxiety, Arthritis, Chronic lower back pain, Heroin use, Hypertension, Neuropathy, Paraspinal abscess (HCC), Pneumonia (08/24/2014), and Renal insufficiency.  Significant Hospital Events   3/6 admit, seizing on arrival to the ICU.  Appeared to resolve clinically with propofol and Versed, still having intermittent myoclonic activity.  EEG ordered  Consults:    Procedures:  ETT 3/6 >  Significant Diagnostic Tests:  CT head 3/6 > no acute change. Stable benign mass.   EEG 3/6 >Severe voltage suppression likely indicating severe encephalopathy  Infrequent burst of generalized of epileptiform discharges with maximum activity involving the left centro-temporal region.  Echo 3/6 >EF 60-65%, RV severely reduced systolic fxn , severely enlarged, RA dilated, small pericardial effusion, degenerative MV, mod TV regurg, RA press  Micro Data:  Blood 3/6 > Urine 3/6 >  Sputum 3/6 >  Antimicrobials:    Interim history/subjective:  Family discussion 3/7 , pt w/ ongoing seizure activity suggestive of severe anoxic injury . Family with decision for no escalation of care and comfort measure.  Extubated 3/7 with comfort support.  Pt on morphine and dilaudid . Remains tachypenic /tachycardic and increased work of breathing .  Seizure activity has decreased.    Objective   Blood pressure (!) 161/99, pulse (!) 121, temperature 98.2 F (36.8 C), resp. rate (!) 31, height 6' (1.829 m), weight 63.5 kg, SpO2 (!) 54 %.    Vent Mode: PRVC FiO2 (%):  [40 %] 40 % Set Rate:  [18 bmp] 18 bmp Vt Set:  [520 mL]  520 mL PEEP:  [5 cmH20] 5 cmH20 Plateau Pressure:  [13 cmH20-16 cmH20] 16 cmH20   Intake/Output Summary (Last 24 hours) at 01/14/2019 0747 Last data filed at 01/12/2019 0700 Gross per 24 hour  Intake 1189.18 ml  Output 1965 ml  Net -775.82 ml   Filed Weights   01/23/19 0630  Weight: 63.5 kg    Examination: General: Chronically ill-appearing male unresponsive on vent  HEENT orally intubated  Pulmonary: Diminished breath sounds in the bases otherwise clear , secretions decreased, ++accessory use  Cardiac:ST   Abdomen soft, flat hypoactive bowel sounds no organomegaly noted  Extremities: Left BKA, necrotic lesions along right toes  Neuro: Unresponsive  GU: Foley  Resolved Hospital Problem list     Assessment & Plan:   Cardiac arrest with concern for anoxic injury: Likely etiology is overdose, although not truly known. 15 minutes PEA arrest after unwitnessed arrest.  Urine drug screen positive for benzos and opiates Plan Comfort measures .  Titrate Morphine and Dilaudid for comfort .   Inability to protect airway with probable underlying COPD -comfort care wean 3/7  Plan -comfort care measures   At risk for fluid and electrolyte imbalance Plan No further lab draws   Hyperglycemia with no history of DM Plan No further labs   History of IVDA -positive for benzos and opiates Plan Titrate meds for comfort    Acute Encephalopathy likely from Anoxic Brain Injury  Seizure Activity  EEG 3/6 with severe encephlopathy , infrequent burst of gen epileptiform discharges .   Plan  Ativan  As needed  Seizures   Best practice:  Diet: NPO  Pain/Anxiety/Delirium protocol (if indicated): comfort care  VAP protocol (if indicated):na DVT prophylaxis:  GI prophylaxis: Glucose control: Mobility: BR Code Status: DNR , no escalation of care , comfort care  Family Communication: Brother Trey Paula notified via telephone.3/6  Disposition: He is critically ill.  Exam is consistent with  anoxic brain injury.   , family discussion 3/7 comfort care .    Critical care time:    Lennix Kneisel NP-C   [facility]  [phone]  02-17-19

## 2019-02-12 DEATH — deceased
